# Patient Record
Sex: Male | Born: 1965
Health system: Southern US, Community
[De-identification: ages and names within clinical notes are randomized; demographics above are authoritative.]

## PROBLEM LIST (undated history)

## (undated) DIAGNOSIS — R0600 Dyspnea, unspecified: Secondary | ICD-10-CM

## (undated) DIAGNOSIS — F32A Depression, unspecified: Secondary | ICD-10-CM

## (undated) DIAGNOSIS — J449 Chronic obstructive pulmonary disease, unspecified: Secondary | ICD-10-CM

## (undated) DIAGNOSIS — I10 Essential (primary) hypertension: Secondary | ICD-10-CM

## (undated) DIAGNOSIS — IMO0002 Reserved for concepts with insufficient information to code with codable children: Secondary | ICD-10-CM

## (undated) DIAGNOSIS — R52 Pain, unspecified: Secondary | ICD-10-CM

## (undated) DIAGNOSIS — F329 Major depressive disorder, single episode, unspecified: Secondary | ICD-10-CM

## (undated) DIAGNOSIS — G4730 Sleep apnea, unspecified: Secondary | ICD-10-CM

## (undated) DIAGNOSIS — M75102 Unspecified rotator cuff tear or rupture of left shoulder, not specified as traumatic: Secondary | ICD-10-CM

## (undated) DIAGNOSIS — F319 Bipolar disorder, unspecified: Secondary | ICD-10-CM

## (undated) DIAGNOSIS — K219 Gastro-esophageal reflux disease without esophagitis: Secondary | ICD-10-CM

## (undated) DIAGNOSIS — G709 Myoneural disorder, unspecified: Secondary | ICD-10-CM

## (undated) HISTORY — DX: Gastro-esophageal reflux disease without esophagitis: K21.9

## (undated) HISTORY — PX: COLON SURGERY: SHX602

## (undated) HISTORY — PX: FEMUR IM NAIL: SHX1597

## (undated) HISTORY — PX: SPINAL CORD STIMULATOR IMPLANT: SHX2422

## (undated) HISTORY — PX: ABDOMINAL EXPLORATION SURGERY: SHX538

## (undated) HISTORY — PX: ANKLE SURGERY: SHX546

## (undated) HISTORY — PX: BACK SURGERY: SHX140

## (undated) HISTORY — PX: PARTIAL COLECTOMY: SHX5273

## (undated) HISTORY — PX: CATARACT EXTRACTION: SUR2

## (undated) HISTORY — PX: LEG SURGERY: SHX1003

---

## 2000-12-19 ENCOUNTER — Encounter: Payer: Self-pay | Admitting: Family Medicine

## 2000-12-19 ENCOUNTER — Ambulatory Visit (HOSPITAL_COMMUNITY): Admission: RE | Admit: 2000-12-19 | Discharge: 2000-12-19 | Payer: Self-pay | Admitting: Family Medicine

## 2001-01-15 ENCOUNTER — Emergency Department (HOSPITAL_COMMUNITY): Admission: EM | Admit: 2001-01-15 | Discharge: 2001-01-15 | Payer: Self-pay | Admitting: *Deleted

## 2002-01-31 ENCOUNTER — Encounter: Payer: Self-pay | Admitting: Neurosurgery

## 2002-01-31 ENCOUNTER — Encounter: Payer: Self-pay | Admitting: *Deleted

## 2002-01-31 ENCOUNTER — Encounter: Payer: Self-pay | Admitting: General Surgery

## 2002-01-31 ENCOUNTER — Encounter: Payer: Self-pay | Admitting: Emergency Medicine

## 2002-01-31 ENCOUNTER — Inpatient Hospital Stay (HOSPITAL_COMMUNITY): Admission: EM | Admit: 2002-01-31 | Discharge: 2002-02-07 | Payer: Self-pay | Admitting: Emergency Medicine

## 2002-02-06 ENCOUNTER — Encounter: Payer: Self-pay | Admitting: General Surgery

## 2002-02-07 ENCOUNTER — Encounter: Payer: Self-pay | Admitting: Neurosurgery

## 2002-05-31 ENCOUNTER — Ambulatory Visit (HOSPITAL_BASED_OUTPATIENT_CLINIC_OR_DEPARTMENT_OTHER): Admission: RE | Admit: 2002-05-31 | Discharge: 2002-05-31 | Payer: Self-pay | Admitting: Orthopedic Surgery

## 2002-06-05 ENCOUNTER — Encounter: Admission: RE | Admit: 2002-06-05 | Discharge: 2002-07-05 | Payer: Self-pay | Admitting: Orthopedic Surgery

## 2002-08-02 ENCOUNTER — Emergency Department (HOSPITAL_COMMUNITY): Admission: EM | Admit: 2002-08-02 | Discharge: 2002-08-02 | Payer: Self-pay | Admitting: *Deleted

## 2002-10-29 ENCOUNTER — Encounter: Payer: Self-pay | Admitting: Internal Medicine

## 2002-10-29 ENCOUNTER — Emergency Department (HOSPITAL_COMMUNITY): Admission: EM | Admit: 2002-10-29 | Discharge: 2002-10-29 | Payer: Self-pay | Admitting: Internal Medicine

## 2003-02-27 ENCOUNTER — Encounter: Payer: Self-pay | Admitting: Emergency Medicine

## 2003-02-27 ENCOUNTER — Inpatient Hospital Stay (HOSPITAL_COMMUNITY): Admission: AC | Admit: 2003-02-27 | Discharge: 2003-03-05 | Payer: Self-pay

## 2003-02-27 ENCOUNTER — Encounter (INDEPENDENT_AMBULATORY_CARE_PROVIDER_SITE_OTHER): Payer: Self-pay

## 2003-05-07 ENCOUNTER — Encounter: Payer: Self-pay | Admitting: Pediatrics

## 2003-05-07 ENCOUNTER — Ambulatory Visit (HOSPITAL_COMMUNITY): Admission: RE | Admit: 2003-05-07 | Discharge: 2003-05-07 | Payer: Self-pay | Admitting: Pediatrics

## 2003-07-09 ENCOUNTER — Ambulatory Visit (HOSPITAL_COMMUNITY): Admission: RE | Admit: 2003-07-09 | Discharge: 2003-07-09 | Payer: Self-pay | Admitting: Family Medicine

## 2003-07-09 ENCOUNTER — Encounter (HOSPITAL_COMMUNITY): Admission: RE | Admit: 2003-07-09 | Discharge: 2003-08-02 | Payer: Self-pay | Admitting: Family Medicine

## 2003-08-06 ENCOUNTER — Encounter (HOSPITAL_COMMUNITY): Admission: RE | Admit: 2003-08-06 | Discharge: 2003-09-05 | Payer: Self-pay | Admitting: Family Medicine

## 2003-11-18 ENCOUNTER — Inpatient Hospital Stay (HOSPITAL_COMMUNITY): Admission: RE | Admit: 2003-11-18 | Discharge: 2003-11-20 | Payer: Self-pay | Admitting: Orthopaedic Surgery

## 2004-05-31 ENCOUNTER — Emergency Department (HOSPITAL_COMMUNITY): Admission: EM | Admit: 2004-05-31 | Discharge: 2004-05-31 | Payer: Self-pay | Admitting: Emergency Medicine

## 2004-08-01 ENCOUNTER — Emergency Department (HOSPITAL_COMMUNITY): Admission: EM | Admit: 2004-08-01 | Discharge: 2004-08-01 | Payer: Self-pay | Admitting: Emergency Medicine

## 2004-12-28 ENCOUNTER — Emergency Department (HOSPITAL_COMMUNITY): Admission: EM | Admit: 2004-12-28 | Discharge: 2004-12-28 | Payer: Self-pay | Admitting: Emergency Medicine

## 2005-02-19 ENCOUNTER — Ambulatory Visit: Payer: Self-pay | Admitting: Psychiatry

## 2005-02-19 ENCOUNTER — Inpatient Hospital Stay (HOSPITAL_COMMUNITY): Admission: EM | Admit: 2005-02-19 | Discharge: 2005-02-22 | Payer: Self-pay | Admitting: Psychiatry

## 2005-03-18 ENCOUNTER — Emergency Department (HOSPITAL_COMMUNITY): Admission: EM | Admit: 2005-03-18 | Discharge: 2005-03-18 | Payer: Self-pay | Admitting: Emergency Medicine

## 2005-06-24 ENCOUNTER — Observation Stay (HOSPITAL_COMMUNITY): Admission: EM | Admit: 2005-06-24 | Discharge: 2005-06-25 | Payer: Self-pay | Admitting: Emergency Medicine

## 2005-07-19 ENCOUNTER — Ambulatory Visit: Payer: Self-pay | Admitting: Orthopedic Surgery

## 2005-08-19 ENCOUNTER — Emergency Department (HOSPITAL_COMMUNITY): Admission: EM | Admit: 2005-08-19 | Discharge: 2005-08-19 | Payer: Self-pay | Admitting: Emergency Medicine

## 2005-08-21 ENCOUNTER — Emergency Department (HOSPITAL_COMMUNITY): Admission: EM | Admit: 2005-08-21 | Discharge: 2005-08-21 | Payer: Self-pay | Admitting: Emergency Medicine

## 2006-06-12 ENCOUNTER — Emergency Department (HOSPITAL_COMMUNITY): Admission: EM | Admit: 2006-06-12 | Discharge: 2006-06-12 | Payer: Self-pay | Admitting: Emergency Medicine

## 2007-01-23 IMAGING — CR DG HIP COMPLETE 2+V*R*
4 series · 4 of 4 positions shown · non-contrast
Comparison: none

CLINICAL DATA: Removal of rod in neck and head of right femur.  Now with redness and pain in incision site.
 RIGHT HIP - 2 VIEW:

[view not recorded (1 of 4)]
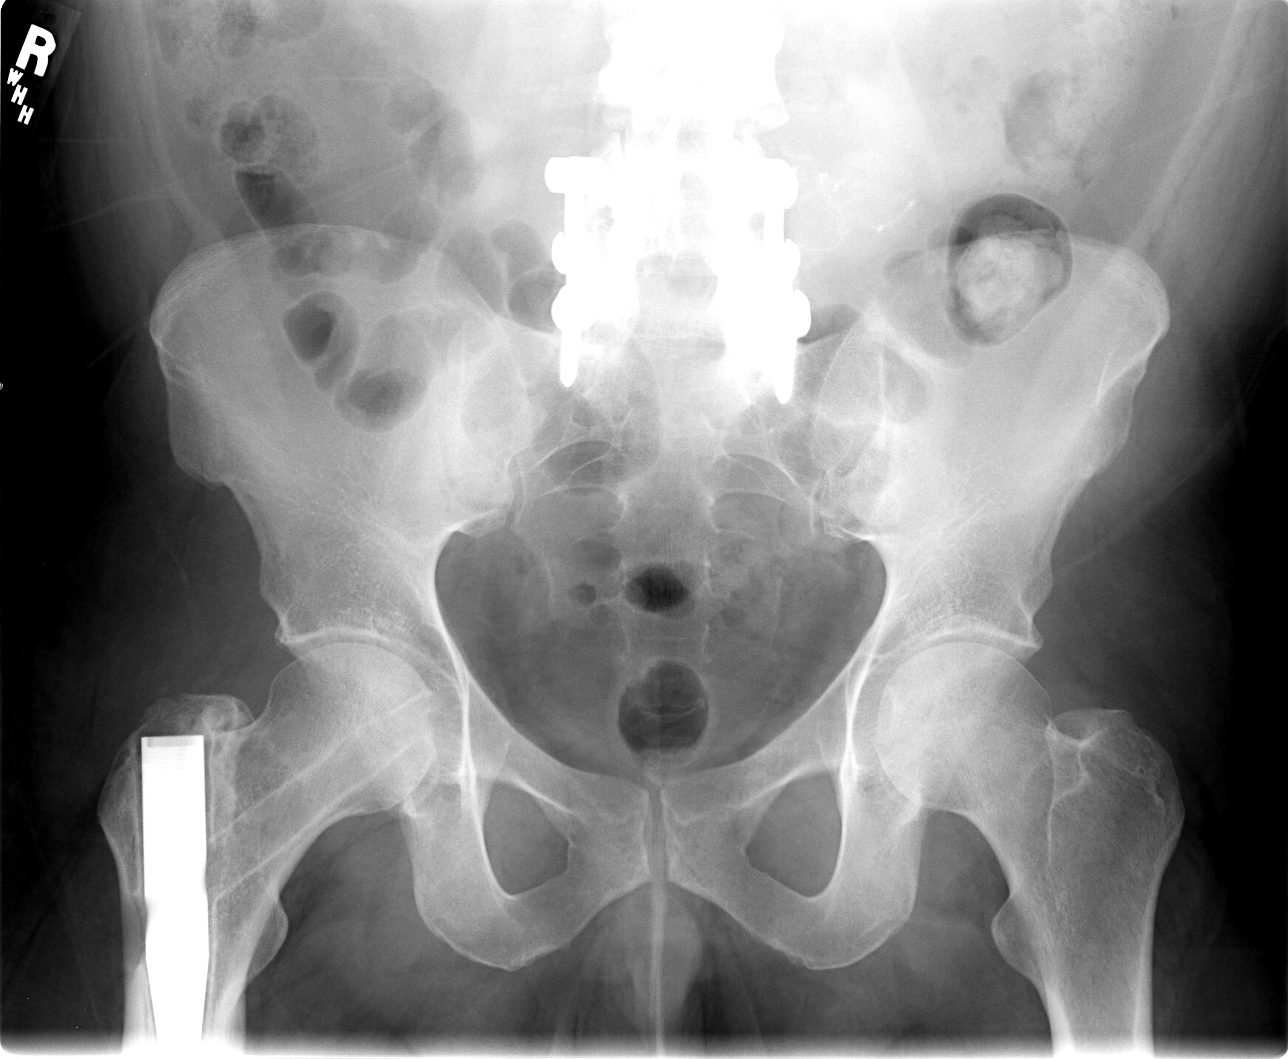

[view not recorded (2 of 4)]
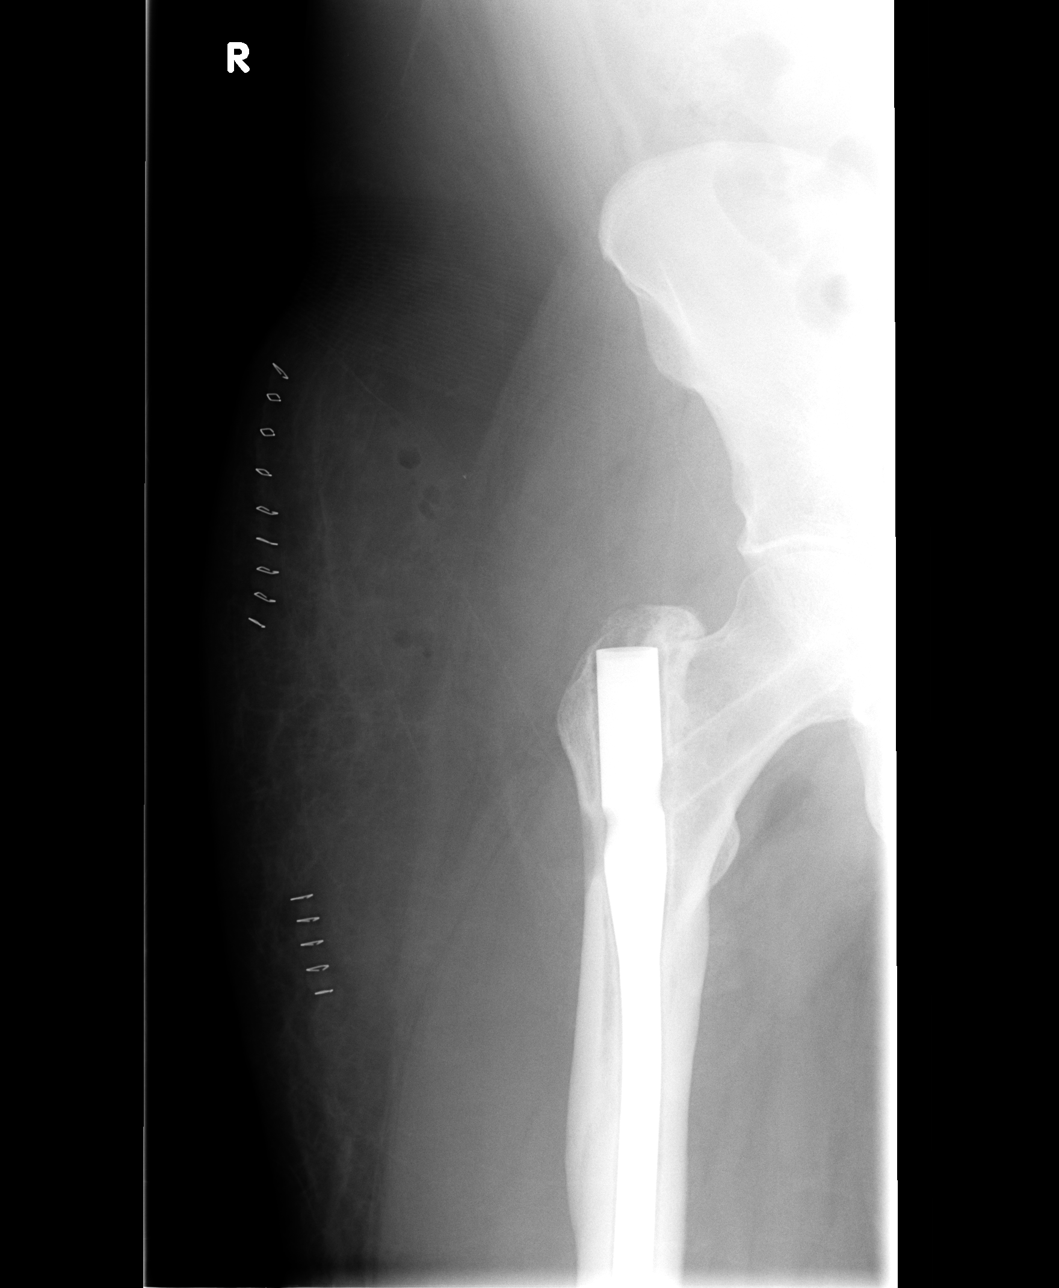

[view not recorded (3 of 4)]
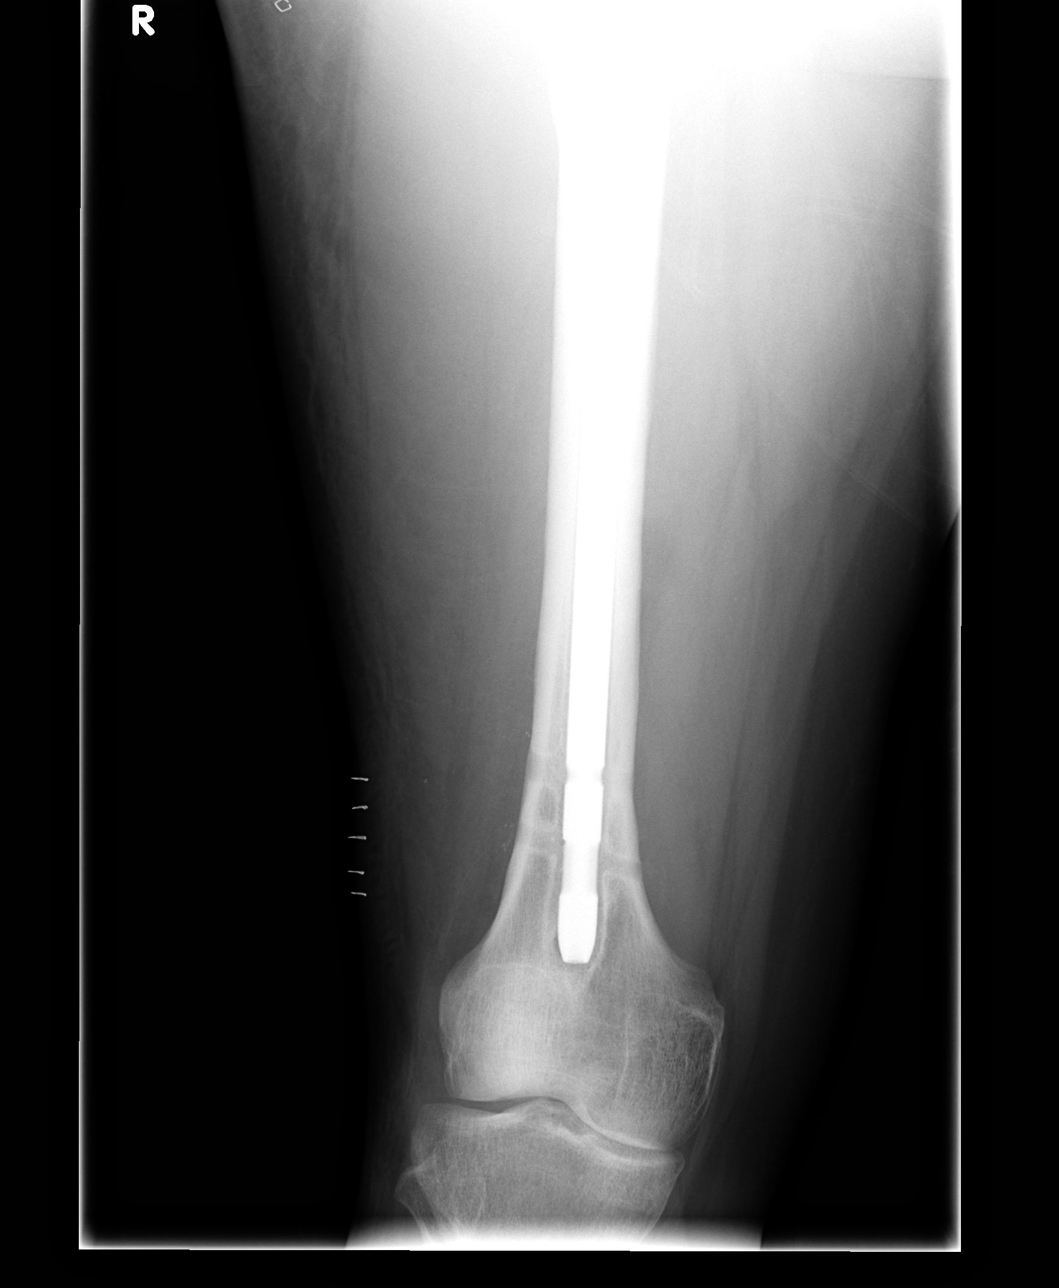

[view not recorded (4 of 4)]
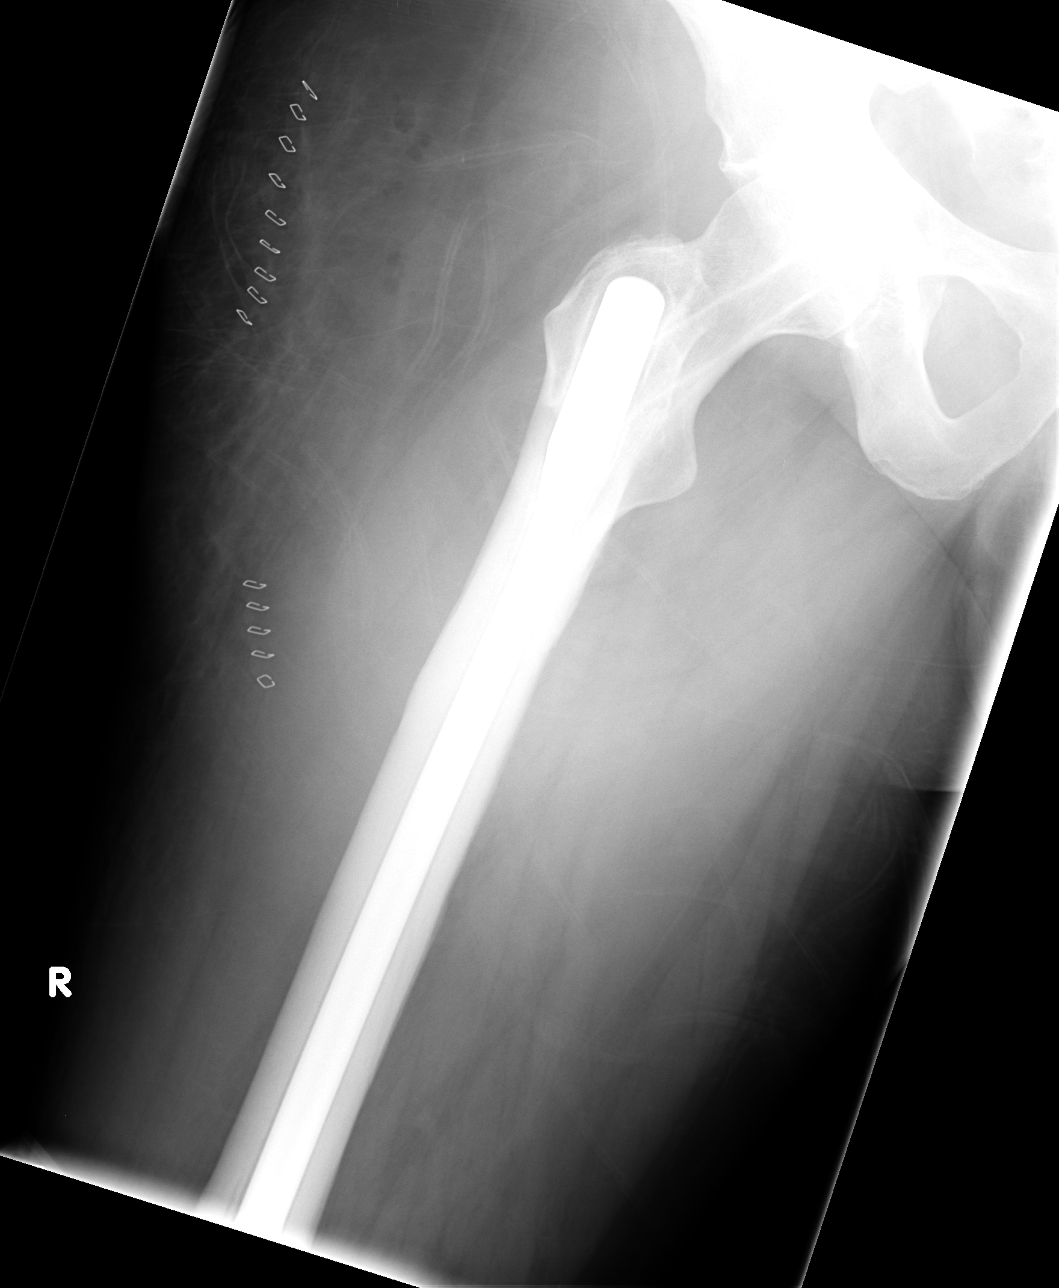

[4 of 4 positions shown; findings below may reference images not displayed]

FINDINGS: There is intramedullary rod.  The femoral neck screw has been removed.  The hips are both normally located.  No fractures or dislocations are identified.  
 There are staples overlying the distal thigh as well as the lateral aspect of the upper leg.  Foci of subcutaneous emphysema is nonspecific in the early postoperative setting.
IMPRESSION: 1.  Postoperative changes consistent with the provided clinical history.
 2.  No acute osseous abnormalities noted.

## 2007-05-04 ENCOUNTER — Ambulatory Visit: Payer: Self-pay | Admitting: Pain Medicine

## 2008-07-01 ENCOUNTER — Ambulatory Visit: Payer: Self-pay | Admitting: Family Medicine

## 2008-07-01 DIAGNOSIS — M129 Arthropathy, unspecified: Secondary | ICD-10-CM | POA: Insufficient documentation

## 2008-07-01 DIAGNOSIS — F319 Bipolar disorder, unspecified: Secondary | ICD-10-CM | POA: Insufficient documentation

## 2008-07-01 DIAGNOSIS — E785 Hyperlipidemia, unspecified: Secondary | ICD-10-CM | POA: Insufficient documentation

## 2008-07-01 DIAGNOSIS — K219 Gastro-esophageal reflux disease without esophagitis: Secondary | ICD-10-CM

## 2008-07-01 DIAGNOSIS — IMO0002 Reserved for concepts with insufficient information to code with codable children: Secondary | ICD-10-CM | POA: Insufficient documentation

## 2008-07-01 DIAGNOSIS — F329 Major depressive disorder, single episode, unspecified: Secondary | ICD-10-CM

## 2008-07-01 DIAGNOSIS — F411 Generalized anxiety disorder: Secondary | ICD-10-CM | POA: Insufficient documentation

## 2008-07-01 DIAGNOSIS — M545 Low back pain, unspecified: Secondary | ICD-10-CM | POA: Insufficient documentation

## 2008-07-01 DIAGNOSIS — G43909 Migraine, unspecified, not intractable, without status migrainosus: Secondary | ICD-10-CM | POA: Insufficient documentation

## 2008-07-03 ENCOUNTER — Ambulatory Visit (HOSPITAL_COMMUNITY): Admission: RE | Admit: 2008-07-03 | Discharge: 2008-07-03 | Payer: Self-pay | Admitting: Family Medicine

## 2008-07-09 ENCOUNTER — Encounter (INDEPENDENT_AMBULATORY_CARE_PROVIDER_SITE_OTHER): Payer: Self-pay | Admitting: Family Medicine

## 2008-07-12 ENCOUNTER — Encounter (INDEPENDENT_AMBULATORY_CARE_PROVIDER_SITE_OTHER): Payer: Self-pay | Admitting: Family Medicine

## 2008-08-05 ENCOUNTER — Ambulatory Visit: Payer: Self-pay | Admitting: Family Medicine

## 2008-08-07 ENCOUNTER — Ambulatory Visit (HOSPITAL_COMMUNITY): Admission: RE | Admit: 2008-08-07 | Discharge: 2008-08-07 | Payer: Self-pay | Admitting: Family Medicine

## 2008-09-27 ENCOUNTER — Ambulatory Visit: Payer: Self-pay | Admitting: Family Medicine

## 2008-09-27 DIAGNOSIS — R5383 Other fatigue: Secondary | ICD-10-CM

## 2008-09-27 DIAGNOSIS — G039 Meningitis, unspecified: Secondary | ICD-10-CM | POA: Insufficient documentation

## 2008-09-27 DIAGNOSIS — R5381 Other malaise: Secondary | ICD-10-CM

## 2008-10-14 ENCOUNTER — Encounter (INDEPENDENT_AMBULATORY_CARE_PROVIDER_SITE_OTHER): Payer: Self-pay | Admitting: Family Medicine

## 2008-12-16 ENCOUNTER — Ambulatory Visit: Payer: Self-pay | Admitting: Family Medicine

## 2009-01-03 ENCOUNTER — Encounter (INDEPENDENT_AMBULATORY_CARE_PROVIDER_SITE_OTHER): Payer: Self-pay | Admitting: *Deleted

## 2009-01-24 ENCOUNTER — Ambulatory Visit: Payer: Self-pay | Admitting: Family Medicine

## 2009-01-24 DIAGNOSIS — E663 Overweight: Secondary | ICD-10-CM | POA: Insufficient documentation

## 2009-02-17 ENCOUNTER — Emergency Department (HOSPITAL_COMMUNITY): Admission: EM | Admit: 2009-02-17 | Discharge: 2009-02-17 | Payer: Self-pay | Admitting: Emergency Medicine

## 2009-02-21 ENCOUNTER — Ambulatory Visit: Payer: Self-pay | Admitting: Family Medicine

## 2009-03-18 ENCOUNTER — Encounter (INDEPENDENT_AMBULATORY_CARE_PROVIDER_SITE_OTHER): Payer: Self-pay | Admitting: Family Medicine

## 2009-03-25 ENCOUNTER — Encounter (INDEPENDENT_AMBULATORY_CARE_PROVIDER_SITE_OTHER): Payer: Self-pay | Admitting: Family Medicine

## 2009-04-10 ENCOUNTER — Ambulatory Visit: Payer: Self-pay | Admitting: Family Medicine

## 2009-04-11 ENCOUNTER — Encounter (INDEPENDENT_AMBULATORY_CARE_PROVIDER_SITE_OTHER): Payer: Self-pay | Admitting: Family Medicine

## 2009-12-22 ENCOUNTER — Emergency Department (HOSPITAL_COMMUNITY): Admission: EM | Admit: 2009-12-22 | Discharge: 2009-12-22 | Payer: Self-pay | Admitting: Emergency Medicine

## 2010-05-09 ENCOUNTER — Emergency Department (HOSPITAL_COMMUNITY): Admission: EM | Admit: 2010-05-09 | Discharge: 2010-05-09 | Payer: Self-pay | Admitting: Diagnostic Radiology

## 2010-05-10 ENCOUNTER — Emergency Department (HOSPITAL_COMMUNITY): Admission: EM | Admit: 2010-05-10 | Discharge: 2010-05-10 | Payer: Self-pay | Admitting: Emergency Medicine

## 2010-05-27 ENCOUNTER — Ambulatory Visit (HOSPITAL_COMMUNITY): Admission: RE | Admit: 2010-05-27 | Discharge: 2010-05-27 | Payer: Self-pay | Admitting: Orthopaedic Surgery

## 2010-11-30 ENCOUNTER — Emergency Department (HOSPITAL_COMMUNITY)
Admission: EM | Admit: 2010-11-30 | Discharge: 2010-11-30 | Disposition: A | Discharge status: Home / Self Care | Payer: Medicare PPO | Attending: Emergency Medicine | Admitting: Emergency Medicine

## 2010-11-30 DIAGNOSIS — F411 Generalized anxiety disorder: Secondary | ICD-10-CM | POA: Insufficient documentation

## 2010-11-30 DIAGNOSIS — Z79899 Other long term (current) drug therapy: Secondary | ICD-10-CM | POA: Insufficient documentation

## 2010-11-30 DIAGNOSIS — M545 Low back pain, unspecified: Secondary | ICD-10-CM | POA: Insufficient documentation

## 2010-11-30 DIAGNOSIS — F3289 Other specified depressive episodes: Secondary | ICD-10-CM | POA: Insufficient documentation

## 2010-11-30 DIAGNOSIS — F329 Major depressive disorder, single episode, unspecified: Secondary | ICD-10-CM | POA: Insufficient documentation
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
## 2010-12-18 NOTE — Op Note (Signed)
[place]  Patient:    [name] [name], [name] [name] Visit Number: [id number] MRN: [id number]          Service Type: TRA Location: MICU 2112 01 Attending Physician:  Trauma, Md Dictated by:   Payton Doughty, M.D. Proc. Date: 01/31/02 Admit Date:  01/31/2002                             Operative Report  PREOPERATIVE DIAGNOSIS:  L5 burst fracture.  POSTOPERATIVE DIAGNOSIS:  L5 burst fracture.  PROCEDURE:  L4-5, L5-S1 pedicle fixation and fusion with posterolateral arthrodesis, laminectomy for decompression of L5.  SURGEON:  Payton Doughty, M.D.  ANESTHESIA:  General endotracheal.  ASSISTANT:  [place].  PREPARATION:  Sterile Betadine prep and scrub with alcohol wipe.  COMPLICATIONS:  None.  DESCRIPTION OF PROCEDURE:  A 45 year old gentleman with an L5 burst fracture with canal compromise.  He was taken to the operating room and smoothly anesthetized and intubated, placed prone on the operating room table. Following shave, prep, and drape in the usual sterile fashion, the skin was infiltrated with 1% lidocaine and 1:400,000 epinephrine.  The skin was incised from the bottom of S1 to the mid-L3, and the laminae of L3, L4, L5, and S1 were exposed bilaterally in the subperiosteal plane out over the transverse processes of L4, L5, and S1 and the sacral alae bilaterally.  Intraoperative x-ray confirmed correctness of the level.  Laminectomy of L5 was carried out on the right side and the lateral margin of the dural sac carefully explored and the anterior epidural space explored.  A retropulsed fragment of bone was pushed back into the vertebral body.  Similar exploration was carried out on the right side.  The lamina had been fractured off and the transverse process essentially split.  This was removed in toto.  The anterior epidural space was once again explored from the left side and found to be free.  The neural foramina of L4, L5, and S1 were also  carefully explored and found to be opened.  The pedicle screws were then placed at L4, L5, and S1, 30 mm at S1 and L5, 35 mm at L4.  The L4 screw on the left side required reorientation one time for lateral cut-out.  Following placement of the screws, intraoperative x-ray showed good placement.  Rods were placed across and were connected with locking caps.  The remaining laminar bone and the facet joints were decorticated with the high-speed drill and packed with BMP and morcellized allograft.  Following this, the fascia was reapproximated with 0 Vicryl in interrupted fashion, the subcutaneous tissue was reapproximated with 0 Vicryl in interrupted fashion, and the patients subcuticular was reapproximated with 0 Vicryl in interrupted fashion and the skin was closed with 3-0 nylon in running locked fashion.  Bacitracin and Telfa dressing was applied and made occlusive with OpSite.  The patient returned to the recovery room in good condition. Dictated by:   Payton Doughty, M.D. Attending Physician:  Trauma, Md DD:  01/31/02 TD:  02/03/02 Job: [id number]
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
## 2010-12-18 NOTE — Procedures (Signed)
[facility]  Patient:    [name] [name], [name] [name] Visit Number: [id number] MRN: [id number]          Service Type: TRA Location: 3000 3006 01 Attending Physician:  Trauma, Md Dictated by:   Kari Baars, M.D. Proc. Date: 01/31/02 Admit Date:  01/31/2002                            EKG Interpretations  DATE:  January 31, 2002, at 0813 hours.  PHYSICIAN:  Dr. Kari Baars.  PROCEDURE:  Electrocardiogram.  DESCRIPTION OF PROCEDURE:  The rhythm is a sinus rhythm.  There is at least one pause seen which appears to be perhaps from sinus arrhythmia.  Otherwise normal electrocardiogram. Dictated by:   Kari Baars, M.D. Attending Physician:  Trauma, Md DD:  01/31/02 TD:  02/04/02 Job: [id number]

## 2010-12-18 NOTE — Op Note (Signed)
NAME:  [name] [name], [name] [name] NO.:  [id number]   MEDICAL RECORD NO.:  [id number]                   PATIENT TYPE:  OIB   LOCATION:  3012                                 FACILITY:  [facility]   PHYSICIAN:  Mark C. Ophelia Charter, M.D.                 DATE OF BIRTH:  Apr 30, 1966   DATE OF PROCEDURE:  11/18/2003  DATE OF DISCHARGE:                                 OPERATIVE REPORT   PREOPERATIVE DIAGNOSIS:  Painful hardware, right ankle.   POSTOPERATIVE DIAGNOSIS:  Painful hardware, right ankle.   OPERATION PERFORMED:  Painful hardware removal, right tibia.  Interfragmentary lag screw times two.   SURGEON:  Mark C. Ophelia Charter, M.D.   ASSISTANT:  Sandrea Matte, P.A.   ANESTHESIA:  General orotracheal anesthesia.   TOURNIQUET TIME:  Less than 30 minutes.   INDICATIONS FOR PROCEDURE:  After induction of general anesthesia,  orotracheal intubation, the leg was cleaned with a clipper.  Preoperative  Ancef was given prophylactically.  This patient had open reduction internal  fixation of a tib-fib fracture with long medial plate which was very  prominent, stuck out 1 cm to 1.5 cm off of the medial malleolus and was  painful.  He also had a screw that backed out anterolateral in the tibia  which was anterior to the fibula which was a 4.5 cortical screw from his  previous surgery that [place].   DESCRIPTION OF PROCEDURE:  Medial incision was opened initially.  Plate was  immediately identified in the proximal aspect of the incision and a scalpel  and cautery were used to clean the soft tissue off the side of the plate.  Subperiosteal dissection with an elevator was used to peel the soft tissue  off the plate and immediately screws were removed using combination of hand  screwdriver and also power screwdriver, once the seal was broken and they  started to back up.  There were two screws that were stripped.  Once was  able to be removed by angling the  screw driver, impacting it with a hammer  and then slipping initial Glorious Peach just underneath the head of the screw  followed by a tonsil clamp and extracting the screw.  The remaining screw  would not back out in any event and the hex head portion of it was stripped.  This was a single screw hole in the plate. Curved osteotomes were used to  run all the way around the plate and make sure there was no overgrown bone  holding it in and then half inch osteotome was used to try to divide the  screw underneath the head in order to removed the painful long plate that  was protruding.  As the screw was trying to be divided a butterfly portion  of the cortex fractured loose as the screw came loose.  Screw and plate will  be removed.  The tibia was  inspected under C-arm and there was a butterfly  fragment which was present but the lateral cortex, anterior cortex, most of  the posterior cortex was intact.  Butterfly piece was reduced, held with  lobster claw clamp and two 3.5 cortical screws were placed from  anterolateral after elevating periosteum back to the posterior cortex which  tightened down the fragment securely.  After irrigation with saline  solution, 2-0 Vicryl was used to reapproximate the subcutaneous tissue, skin  staple closure.  Distal anterolateral screw was removed by making a stab  incision using a Freer to remove the soft tissue and then using the large  fragment screw driver to remove the screw.  This was closed with staples as  well.  The patient was then placed in a short leg splint after  Xeroform, 4 x 4s, Webril, short leg splint and Ace wrap.  The patient  tolerated the procedure well and transferred to recovery room.  He will be  protected weightbearing in a boot due to the small butterfly fragment to  help with stability times six weeks.                                               Mark C. Ophelia Charter, M.D.    MCY/MEDQ  D:  11/18/2003  T:  11/19/2003  Job:  [id number]
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
## 2010-12-18 NOTE — Procedures (Signed)
   NAME:  MOHID, FURUYA                         ACCOUNT NO.:  [id number]   MEDICAL RECORD NO.:  [id number]                  PATIENT TYPE:   LOCATION:                                       FACILITY:   PHYSICIAN:  Edward L. Juanetta Gosling, M.D.             DATE OF BIRTH:   DATE OF PROCEDURE:  10/29/2002  DATE OF DISCHARGE:                                EKG INTERPRETATION   DATE/TIME OF SERVICE:  0302 on 10/29/02   RESULTS:  The rhythm was sinus rhythm with a rate in the 90s, otherwise  normal electrocardiogram.                                               Edward L. Juanetta Gosling, M.D.    ELH/MEDQ  D:  11/05/2002  T:  11/06/2002  Job:  [id number]

## 2011-01-29 ENCOUNTER — Emergency Department (HOSPITAL_COMMUNITY): Payer: Medicare PPO

## 2011-01-29 ENCOUNTER — Emergency Department (HOSPITAL_COMMUNITY)
Admission: EM | Admit: 2011-01-29 | Discharge: 2011-01-29 | Discharge status: Against Medical Advice | Payer: Medicare PPO | Attending: Emergency Medicine | Admitting: Emergency Medicine

## 2011-01-29 ENCOUNTER — Emergency Department (HOSPITAL_COMMUNITY)
Admission: EM | Admit: 2011-01-29 | Discharge: 2011-01-29 | Disposition: A | Discharge status: Home / Self Care | Payer: Medicare PPO | Attending: Emergency Medicine | Admitting: Emergency Medicine

## 2011-01-29 DIAGNOSIS — F17200 Nicotine dependence, unspecified, uncomplicated: Secondary | ICD-10-CM | POA: Insufficient documentation

## 2011-01-29 DIAGNOSIS — G8929 Other chronic pain: Secondary | ICD-10-CM | POA: Insufficient documentation

## 2011-01-29 DIAGNOSIS — Z0389 Encounter for observation for other suspected diseases and conditions ruled out: Secondary | ICD-10-CM | POA: Insufficient documentation

## 2011-01-29 DIAGNOSIS — F3289 Other specified depressive episodes: Secondary | ICD-10-CM | POA: Insufficient documentation

## 2011-01-29 DIAGNOSIS — R079 Chest pain, unspecified: Secondary | ICD-10-CM | POA: Insufficient documentation

## 2011-01-29 DIAGNOSIS — F329 Major depressive disorder, single episode, unspecified: Secondary | ICD-10-CM | POA: Insufficient documentation

## 2011-01-29 DIAGNOSIS — M545 Low back pain, unspecified: Secondary | ICD-10-CM | POA: Insufficient documentation

## 2011-01-29 DIAGNOSIS — F1911 Other psychoactive substance abuse, in remission: Secondary | ICD-10-CM | POA: Insufficient documentation

## 2011-01-29 DIAGNOSIS — F411 Generalized anxiety disorder: Secondary | ICD-10-CM | POA: Insufficient documentation

## 2011-02-10 ENCOUNTER — Encounter: Payer: Self-pay | Admitting: *Deleted

## 2011-02-10 ENCOUNTER — Emergency Department (HOSPITAL_COMMUNITY)
Admission: EM | Admit: 2011-02-10 | Discharge: 2011-02-11 | Disposition: A | Discharge status: Home / Self Care | Payer: Medicare PPO | Attending: Emergency Medicine | Admitting: Emergency Medicine

## 2011-02-10 DIAGNOSIS — F319 Bipolar disorder, unspecified: Secondary | ICD-10-CM | POA: Insufficient documentation

## 2011-02-10 DIAGNOSIS — Z87891 Personal history of nicotine dependence: Secondary | ICD-10-CM | POA: Insufficient documentation

## 2011-02-10 DIAGNOSIS — H9319 Tinnitus, unspecified ear: Secondary | ICD-10-CM | POA: Insufficient documentation

## 2011-02-10 DIAGNOSIS — H9209 Otalgia, unspecified ear: Secondary | ICD-10-CM | POA: Insufficient documentation

## 2011-02-10 DIAGNOSIS — H9311 Tinnitus, right ear: Secondary | ICD-10-CM

## 2011-02-10 HISTORY — DX: Bipolar disorder, unspecified: F31.9

## 2011-02-10 HISTORY — DX: Reserved for concepts with insufficient information to code with codable children: IMO0002

## 2011-02-10 HISTORY — DX: Depression, unspecified: F32.A

## 2011-02-10 HISTORY — DX: Major depressive disorder, single episode, unspecified: F32.9

## 2011-02-10 NOTE — ED Notes (Signed)
Patient c/o ear ringing in right ear and ear ache to left ear for few months per pt

## 2011-02-10 NOTE — ED Notes (Signed)
Patient is resting comfortably. Family at bedside.  

## 2011-02-11 MED ORDER — MECLIZINE HCL 25 MG PO TABS: 25.0000 mg | ORAL_TABLET | Freq: Four times a day (QID) | ORAL | Status: AC

## 2011-02-11 MED ORDER — ANTIPYRINE-BENZOCAINE 5.4-1.4 % OT SOLN: 3.0000 [drp] | OTIC | Status: AC | PRN
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
## 2011-02-25 ENCOUNTER — Encounter (HOSPITAL_COMMUNITY): Payer: Self-pay

## 2011-02-25 ENCOUNTER — Emergency Department (HOSPITAL_COMMUNITY)
Admission: EM | Admit: 2011-02-25 | Discharge: 2011-02-25 | Disposition: A | Discharge status: Home / Self Care | Payer: Medicare PPO | Attending: Emergency Medicine | Admitting: Emergency Medicine

## 2011-02-25 DIAGNOSIS — F17200 Nicotine dependence, unspecified, uncomplicated: Secondary | ICD-10-CM | POA: Insufficient documentation

## 2011-02-25 DIAGNOSIS — F319 Bipolar disorder, unspecified: Secondary | ICD-10-CM | POA: Insufficient documentation

## 2011-02-25 DIAGNOSIS — H6690 Otitis media, unspecified, unspecified ear: Secondary | ICD-10-CM | POA: Insufficient documentation

## 2011-02-25 MED ORDER — ALPRAZOLAM 0.5 MG PO TABS
0.5000 mg | ORAL_TABLET | Freq: Once | ORAL | Status: AC
  Administered 2011-02-25: 0.5 mg via ORAL
  Filled 2011-02-25: qty 1

## 2011-02-25 MED ORDER — AMOXICILLIN 500 MG PO CAPS: 500.0000 mg | ORAL_CAPSULE | Freq: Three times a day (TID) | ORAL | Status: AC

## 2011-02-25 MED ORDER — CLONAZEPAM 0.5 MG PO TABS: 0.5000 mg | ORAL_TABLET | Freq: Two times a day (BID) | ORAL | Status: DC | PRN

## 2011-02-25 MED ORDER — AMOXICILLIN 500 MG PO CAPS: 500.0000 mg | ORAL_CAPSULE | Freq: Three times a day (TID) | ORAL | Status: DC

## 2011-02-25 NOTE — ED Notes (Signed)
Pt reports bilateral ear pain for the past few months.  Pt reports a constant dull ache to both ears.
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
## 2011-07-25 ENCOUNTER — Encounter (HOSPITAL_COMMUNITY): Payer: Self-pay | Admitting: *Deleted

## 2011-07-25 ENCOUNTER — Emergency Department (HOSPITAL_COMMUNITY)
Admission: EM | Admit: 2011-07-25 | Discharge: 2011-07-25 | Disposition: A | Discharge status: Home / Self Care | Payer: Medicare PPO | Attending: Emergency Medicine | Admitting: Emergency Medicine

## 2011-07-25 DIAGNOSIS — Z87828 Personal history of other (healed) physical injury and trauma: Secondary | ICD-10-CM | POA: Insufficient documentation

## 2011-07-25 DIAGNOSIS — F319 Bipolar disorder, unspecified: Secondary | ICD-10-CM | POA: Insufficient documentation

## 2011-07-25 DIAGNOSIS — R443 Hallucinations, unspecified: Secondary | ICD-10-CM | POA: Insufficient documentation

## 2011-07-25 DIAGNOSIS — F17200 Nicotine dependence, unspecified, uncomplicated: Secondary | ICD-10-CM | POA: Insufficient documentation

## 2011-07-25 LAB — DIFFERENTIAL
Basophils Absolute: 0.1 10*3/uL (ref 0.0–0.1)
Basophils Relative: 0 % (ref 0–1)
Neutro Abs: 13.2 10*3/uL — ABNORMAL HIGH (ref 1.7–7.7)
Neutrophils Relative %: 74 % (ref 43–77)

## 2011-07-25 LAB — COMPREHENSIVE METABOLIC PANEL
AST: 12 U/L (ref 0–37)
Albumin: 4 g/dL (ref 3.5–5.2)
Alkaline Phosphatase: 75 U/L (ref 39–117)
Chloride: 98 mEq/L (ref 96–112)
Potassium: 3 mEq/L — ABNORMAL LOW (ref 3.5–5.1)
Sodium: 133 mEq/L — ABNORMAL LOW (ref 135–145)
Total Bilirubin: 0.2 mg/dL — ABNORMAL LOW (ref 0.3–1.2)
Total Protein: 7 g/dL (ref 6.0–8.3)

## 2011-07-25 LAB — RAPID URINE DRUG SCREEN, HOSP PERFORMED
Amphetamines: NOT DETECTED
Barbiturates: NOT DETECTED
Cocaine: NOT DETECTED
Tetrahydrocannabinol: NOT DETECTED

## 2011-07-25 LAB — CBC
Hemoglobin: 17 g/dL (ref 13.0–17.0)
MCHC: 35.3 g/dL (ref 30.0–36.0)
Platelets: 267 10*3/uL (ref 150–400)
RDW: 12.2 % (ref 11.5–15.5)

## 2011-07-25 MED ORDER — POTASSIUM CHLORIDE CRYS ER 20 MEQ PO TBCR
40.0000 meq | EXTENDED_RELEASE_TABLET | Freq: Once | ORAL | Status: AC
  Administered 2011-07-25: 40 meq via ORAL
  Filled 2011-07-25: qty 2

## 2011-07-25 NOTE — ED Notes (Signed)
Pt here under IVC with [facility] - per papers, pt is paranoid, talking to t.v.  Papers state pt carries gun with him at all times and family is fearful for pt's safety.  Pt denies SI/HI, denies hallucinations/delusions.
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
## 2011-07-25 NOTE — ED Notes (Signed)
[facility] has left and sitter to bedside, pt was provided crackers by staff

## 2011-08-10 ENCOUNTER — Emergency Department (HOSPITAL_COMMUNITY)
Admission: EM | Admit: 2011-08-10 | Discharge: 2011-08-20 | Disposition: A | Discharge status: Other Acute Inpatient Hospital | Payer: Medicare PPO | Attending: Emergency Medicine | Admitting: Emergency Medicine

## 2011-08-10 ENCOUNTER — Encounter (HOSPITAL_COMMUNITY): Payer: Self-pay | Admitting: *Deleted

## 2011-08-10 DIAGNOSIS — M549 Dorsalgia, unspecified: Secondary | ICD-10-CM | POA: Insufficient documentation

## 2011-08-10 DIAGNOSIS — F319 Bipolar disorder, unspecified: Secondary | ICD-10-CM | POA: Insufficient documentation

## 2011-08-10 DIAGNOSIS — F17200 Nicotine dependence, unspecified, uncomplicated: Secondary | ICD-10-CM | POA: Insufficient documentation

## 2011-08-10 DIAGNOSIS — F29 Unspecified psychosis not due to a substance or known physiological condition: Secondary | ICD-10-CM | POA: Insufficient documentation

## 2011-08-10 DIAGNOSIS — R45850 Homicidal ideations: Secondary | ICD-10-CM | POA: Insufficient documentation

## 2011-08-10 DIAGNOSIS — IMO0002 Reserved for concepts with insufficient information to code with codable children: Secondary | ICD-10-CM | POA: Insufficient documentation

## 2011-08-10 LAB — BASIC METABOLIC PANEL
BUN: 7 mg/dL (ref 6–23)
Chloride: 104 mEq/L (ref 96–112)
GFR calc Af Amer: >90 mL/min (ref >90)
GFR calc non Af Amer: >90 mL/min (ref >90)
Potassium: 3.8 mEq/L (ref 3.5–5.1)
Sodium: 139 mEq/L (ref 135–145)

## 2011-08-10 LAB — DIFFERENTIAL
Basophils Relative: 0 % (ref 0–1)
Eosinophils Absolute: 0.1 10*3/uL (ref 0.0–0.7)
Lymphs Abs: 3 10*3/uL (ref 0.7–4.0)
Monocytes Relative: 7 % (ref 3–12)
Neutro Abs: 8.1 10*3/uL — ABNORMAL HIGH (ref 1.7–7.7)
Neutrophils Relative %: 68 % (ref 43–77)

## 2011-08-10 LAB — CBC
Hemoglobin: 15.3 g/dL (ref 13.0–17.0)
MCHC: 35.1 g/dL (ref 30.0–36.0)
Platelets: 240 10*3/uL (ref 150–400)
RBC: 5.12 MIL/uL (ref 4.22–5.81)

## 2011-08-10 LAB — RAPID URINE DRUG SCREEN, HOSP PERFORMED
Amphetamines: NOT DETECTED
Barbiturates: NOT DETECTED
Tetrahydrocannabinol: NOT DETECTED

## 2011-08-10 MED ORDER — CLONAZEPAM 0.5 MG PO TABS
1.0000 mg | ORAL_TABLET | Freq: Once | ORAL | Status: AC
  Administered 2011-08-10: 1 mg via ORAL

## 2011-08-10 MED ORDER — NICOTINE 21 MG/24HR TD PT24
21.0000 mg | MEDICATED_PATCH | Freq: Once | TRANSDERMAL | Status: AC
  Administered 2011-08-10 – 2011-08-11 (×2): 21 mg via TRANSDERMAL
  Filled 2011-08-10 (×2): qty 1

## 2011-08-10 MED ORDER — CLONAZEPAM 0.5 MG PO TABS
1.0000 mg | ORAL_TABLET | Freq: Every day | ORAL | Status: DC
  Administered 2011-08-11: 1 mg via ORAL
  Filled 2011-08-10: qty 2

## 2011-08-10 MED ORDER — CLONAZEPAM 0.5 MG PO TABS
ORAL_TABLET | ORAL | Status: AC
  Administered 2011-08-10: 1 mg via ORAL
  Filled 2011-08-10: qty 2

## 2011-08-10 MED ORDER — ZIPRASIDONE MESYLATE 20 MG IM SOLR
INTRAMUSCULAR | Status: AC
  Filled 2011-08-10: qty 20

## 2011-08-10 MED ORDER — ZIPRASIDONE MESYLATE 20 MG IM SOLR: 20.0000 mg | Freq: Once | INTRAMUSCULAR | Status: DC

## 2011-08-10 NOTE — ED Notes (Signed)
Leigh from [facility] telephoned to notify pt is on their wait list.

## 2011-08-10 NOTE — ED Notes (Signed)
Sitting up on stretcher eating dinner; states, "I need something for pain".  C/o lower back pain radiating down left leg; wearing Fentanyl patch; in no apparent distress; will notify EDP.
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
## 2011-08-10 NOTE — ED Notes (Signed)
Patient became agitated - talking very loudly- states he needs his medication NOW requesting clonazepam and two hydrocodone 10-325.  He is aware he is wearing his Fentanyl patch.  States he has severe lower back pain, chronic. Advised
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
## 2011-08-11 MED ORDER — EPINEPHRINE HCL 0.1 MG/ML IJ SOLN
INTRAMUSCULAR | Status: AC
  Filled 2011-08-11: qty 10

## 2011-08-11 MED ORDER — ATROPINE SULFATE 0.1 MG/ML IJ SOLN
INTRAMUSCULAR | Status: AC
  Filled 2011-08-11: qty 10

## 2011-08-11 MED ORDER — CLONAZEPAM 0.5 MG PO TABS
2.0000 mg | ORAL_TABLET | Freq: Two times a day (BID) | ORAL | Status: DC
  Administered 2011-08-11 – 2011-08-16 (×10): 2 mg via ORAL
  Filled 2011-08-11 (×6): qty 4

## 2011-08-11 NOTE — ED Notes (Signed)
Pt's son called and requesting info about how long pt will be here and plan to where pt will be placed, requested that staff call him back once pt is transferred; Bentlie Catanzaro (pt's son) # [phone]

## 2011-08-11 NOTE — ED Notes (Signed)
Awake - requesting food.  Has eaten several packs of crackers and sprite. Advised will serve breakfast in a couple of hours.  Remains under observation of Deputy with chain / cuff to foot of stretcher.  Encouraged to try to get some sleep

## 2011-08-11 NOTE — ED Notes (Signed)
See downtime charting. 

## 2011-08-12 NOTE — ED Notes (Signed)
AC called for med due at 2200. Pt up to go to bathroom. Security at door of bathroom.

## 2011-08-12 NOTE — ED Notes (Signed)
Resting with no complaints at this time. Emergency planning/management officer at bedside.
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
## 2011-08-12 NOTE — ED Notes (Signed)
Pt resting calmly w/ eyes closed. Rise & fall of the chest noted. Bed in low position, side rails up x2. NAD noted at this time.  [employer] w/ pt.

## 2011-08-12 NOTE — ED Notes (Signed)
Emergency planning/management officer at bedside. Pt sleeping at present.

## 2011-08-12 NOTE — Progress Notes (Signed)
1446 Patient with suicidal and homicidal ideation. Awaiting placement at [facility]. Request placed for telepsych consult to see if patient still meets criteria for IVC placement at [facility]. Evaluated by Dr. Jacky Kindle. He feels patient needs to be sent to [facility]. His evaluation is on the chart.

## 2011-08-12 NOTE — ED Notes (Signed)
Pt sitting in chair in room. Pt has finished with his bathing and teeth brushed. Linens changed. Emergency planning/management officer at bedside.

## 2011-08-12 NOTE — ED Notes (Signed)
Pt c/o pain in his lower back. MD notified.

## 2011-08-12 NOTE — ED Notes (Signed)
Ambulatory to bathroom accompanied by officer Sport and exercise psychologist)

## 2011-08-12 NOTE — ED Notes (Signed)
Emergency planning/management officer at bedside. Pt still sleeping.

## 2011-08-12 NOTE — ED Notes (Signed)
Pt resting, NAD noted. Security officers at bedside.

## 2011-08-12 NOTE — ED Notes (Signed)
Pt having tele psych evaluation at this time. Emergency planning/management officer at bedside. Pt sleeping at intervals.

## 2011-08-12 NOTE — ED Notes (Signed)
Pt states that he feels drained and needs some sugar. Notified that his meal tray will be here shortly. Pt is calm and cooperative at this time. Pt notified that we are waiting for Tele Psych evaluation. Police officers at bedside.

## 2011-08-12 NOTE — ED Notes (Signed)
Pt sleeping at present. Emergency planning/management officer at bedside.

## 2011-08-12 NOTE — ED Notes (Signed)
Pt to restroom w/ [employer] at pt side. Pt returned to room w/ no complications.
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
## 2011-08-12 NOTE — ED Notes (Signed)
Pt sleeping at times. Emergency planning/management officer at bedside. Awaiting placement.
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
## 2011-08-12 NOTE — ED Notes (Signed)
Pt voided in urinal. Pt sleeping at intervals with no complaints at this time.

## 2011-08-12 NOTE — ED Notes (Signed)
Pt resting calmly w/ eyes closed. Rise & fall of the chest noted. Bed in low position, side rails up x2. NAD noted at this time.  [facility]sd remains w/ pt.

## 2011-08-13 NOTE — ED Notes (Signed)
Pt resting comfortably, nad noted.  [facility] at bedside.

## 2011-08-13 NOTE — ED Notes (Signed)
Son stated he would like to know when and where patient is transferred to. Son is Jarell Mcewen, number [phone].
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
## 2011-08-13 NOTE — ED Notes (Signed)
Received report from night shift RN that pt received a phone call from family member stating that pt's "girlfriend" past away last night.  Questioned pt regarding acquaintance with deceased, pt reports girlfriend from past, ,denies relationship with deceased presently.  Informed pt of death.  Pt calm, cooperative, thankful for information, showing no grief.  [facility] at bedside.  Pt remains shackled to bed.  CMS intact in lower extremities.  Pt also reports having large BM this morning, denies needing more meds for BM.  nad noted.

## 2011-08-13 NOTE — ED Notes (Signed)
Patient does not need anything at this time. 

## 2011-08-13 NOTE — ED Notes (Signed)
[facility] at bedside, right ankle shackled to bed, CMS intact.  nad noted.

## 2011-08-13 NOTE — ED Notes (Signed)
Lunch tray given.  Warm blanket also given.  nad noted.  [facility] at bedside.

## 2011-08-13 NOTE — ED Notes (Signed)
Family member at bedside.

## 2011-08-13 NOTE — ED Notes (Signed)
Pt reports to NT that he has not had BM in 2 days, requesting meds for bm.  edp notified.

## 2011-08-13 NOTE — ED Notes (Signed)
Pt up to bathroom with deputy.  nad noted. 

## 2011-08-13 NOTE — ED Notes (Signed)
Officer stated patient did not want to walk to the bathroom and is requesting a urinal. Gave patient urinal as requested.

## 2011-08-13 NOTE — ED Notes (Signed)
Pt requesting to speak to ACT stating, "I've been here for 5 days and haven't spoken to anybody."  Samson Frederic with [facility] notified that pt wanted to speak to her.  Informed pt that he has spoken with someone and that he is pending a bed a [facility].  Pt verbalized understanding.  Pt right ankle cuffed to bed.  CMS intact, [facility] at bedside.  Pt's son here to visit.

## 2011-08-14 MED ORDER — HYDROCODONE-ACETAMINOPHEN 5-325 MG PO TABS
2.0000 | ORAL_TABLET | Freq: Once | ORAL | Status: AC
  Administered 2011-08-14: 2 via ORAL

## 2011-08-14 MED ORDER — HYDROCODONE-ACETAMINOPHEN 5-325 MG PO TABS
ORAL_TABLET | ORAL | Status: AC
  Filled 2011-08-14: qty 2

## 2011-08-14 NOTE — ED Notes (Signed)
Pt requesting apple juice at this time. Request granted.

## 2011-08-14 NOTE — ED Notes (Signed)
Pt requesting pain medicine for low back pain.  Notified Dr. Judd Lien and order given.

## 2011-08-14 NOTE — ED Notes (Signed)
Pt states back pain is increasing.  Dr Adriana Simas notified.
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
## 2011-08-15 ENCOUNTER — Encounter (HOSPITAL_COMMUNITY): Payer: Self-pay | Admitting: *Deleted

## 2011-08-15 MED ORDER — HYDROCODONE-ACETAMINOPHEN 5-325 MG PO TABS
ORAL_TABLET | ORAL | Status: AC
  Administered 2011-08-15: 03:00:00
  Filled 2011-08-15: qty 2

## 2011-08-15 MED ORDER — NICOTINE 21 MG/24HR TD PT24
21.0000 mg | MEDICATED_PATCH | Freq: Every day | TRANSDERMAL | Status: DC
  Administered 2011-08-15 – 2011-08-20 (×6): 21 mg via TRANSDERMAL
  Filled 2011-08-15 (×5): qty 1

## 2011-08-15 MED ORDER — IBUPROFEN 800 MG PO TABS
800.0000 mg | ORAL_TABLET | Freq: Three times a day (TID) | ORAL | Status: DC
  Administered 2011-08-15 – 2011-08-17 (×6): 800 mg via ORAL
  Filled 2011-08-15 (×11): qty 1

## 2011-08-15 NOTE — Progress Notes (Signed)
08/15/11  2005  Updated petition.  Pt is on wait list for [facility]. Dr Manus Gunning agrees with disposition.

## 2011-08-15 NOTE — ED Notes (Signed)
Patient requesting pain meds. Informed md of this and no meds  ordered

## 2011-08-15 NOTE — ED Notes (Signed)
Pt resting in bed, even rise and fall of chest, in NAD. Officer remains at bedside. Will continue to monitor. 

## 2011-08-15 NOTE — ED Notes (Signed)
Pt stated he wanted his medication. The pt was told that he will receive his medications as scheduled. The pt became upset and made comments when this nurse was walking away. This nurse explained again to the pt that he will receive the medication that the physician orders.

## 2011-08-16 MED ORDER — ALPRAZOLAM 0.5 MG PO TABS
1.0000 mg | ORAL_TABLET | Freq: Three times a day (TID) | ORAL | Status: DC
  Administered 2011-08-16 – 2011-08-20 (×10): 1 mg via ORAL
  Filled 2011-08-16: qty 2

## 2011-08-16 MED ORDER — ASPIRIN EC 325 MG PO TBEC
325.0000 mg | DELAYED_RELEASE_TABLET | Freq: Two times a day (BID) | ORAL | Status: DC
  Administered 2011-08-16 – 2011-08-20 (×8): 325 mg via ORAL
  Filled 2011-08-16 (×7): qty 1

## 2011-08-16 NOTE — ED Notes (Signed)
Pt given a meal tray 

## 2011-08-16 NOTE — ED Notes (Signed)
Pt up to bathroom with deputy.  States has been unable to void because "his kidneys are shutting down because he has not had his fentanyl patch."  Per RN report from previous shift, issue has already been addressed by edp.  Breakfast tray given.

## 2011-08-16 NOTE — ED Notes (Signed)
Pt given a meal tray.  Pt is calm and cooperative.

## 2011-08-16 NOTE — ED Notes (Signed)
Pt resting in bed at present.  Pt eyes closed with rr of 18 and even.

## 2011-08-16 NOTE — ED Notes (Signed)
Pt given a sprite 

## 2011-08-16 NOTE — ED Notes (Signed)
Connie with [facility] called to check on pt. He's still on the waiting list and she will call back when she get a room.

## 2011-08-16 NOTE — ED Notes (Signed)
Pt resting in bed with eyes closed, in NAD. Sitter outside door

## 2011-08-16 NOTE — Progress Notes (Signed)
Called to [facility], spoke with Elnita Maxwell. [name] [name] remains on the wait list. He is about mid-way up the list at this time. Discharges to be decided later this a.m.,staff will call when [name] [name] has been accepted for admission.

## 2011-08-16 NOTE — ED Notes (Signed)
Pt sleeping with snoring respirations.  Right ankle shackled to bed.  CMS intact.  [facility] at bedside.  nad noted.

## 2011-08-16 NOTE — ED Notes (Signed)
Pt resting in bed at  Present.  Pt eyes closed and rr of 18 and even

## 2011-08-16 NOTE — ED Notes (Signed)
Pt reports voiding while up to bathroom.

## 2011-08-16 NOTE — ED Notes (Signed)
Pt up to bathroom escorted by police officer.

## 2011-08-16 NOTE — ED Notes (Signed)
Pt resting on bed.  Pt talking calm and cooperative at present.

## 2011-08-16 NOTE — Progress Notes (Signed)
1600 Patient continues to await placement at [facility].

## 2011-08-16 NOTE — ED Notes (Signed)
Pt resting in bed at resent.  Eyes closed, RR of 18 and even.

## 2011-08-16 NOTE — ED Notes (Signed)
Pt requesting to see MD, MD aware

## 2011-08-16 NOTE — ED Notes (Signed)
PT RESTING IN BED.  EYES CLOSED AND RR OF 18 AND EVEN

## 2011-08-16 NOTE — ED Notes (Signed)
MD at bedside. 

## 2011-08-17 MED ORDER — AMITRIPTYLINE HCL 25 MG PO TABS
25.0000 mg | ORAL_TABLET | Freq: Every day | ORAL | Status: DC
  Administered 2011-08-17 – 2011-08-19 (×3): 25 mg via ORAL
  Filled 2011-08-17 (×4): qty 1

## 2011-08-17 MED ORDER — IBUPROFEN 400 MG PO TABS
200.0000 mg | ORAL_TABLET | Freq: Four times a day (QID) | ORAL | Status: DC | PRN
  Administered 2011-08-17: 200 mg via ORAL

## 2011-08-17 MED ORDER — HYDROCODONE-ACETAMINOPHEN 5-325 MG PO TABS
2.0000 | ORAL_TABLET | Freq: Four times a day (QID) | ORAL | Status: DC | PRN
  Administered 2011-08-18 – 2011-08-20 (×4): 2 via ORAL

## 2011-08-17 MED ORDER — HYDROCODONE-ACETAMINOPHEN 5-325 MG PO TABS
2.0000 | ORAL_TABLET | Freq: Once | ORAL | Status: AC
  Administered 2011-08-17: 2 via ORAL

## 2011-08-17 MED ORDER — CLONAZEPAM 0.5 MG PO TABS: 1.0000 mg | ORAL_TABLET | Freq: Two times a day (BID) | ORAL | Status: DC | PRN

## 2011-08-17 MED ORDER — FENTANYL 50 MCG/HR TD PT72
50.0000 ug | MEDICATED_PATCH | TRANSDERMAL | Status: DC
  Administered 2011-08-17: 50 ug via TRANSDERMAL
  Filled 2011-08-17: qty 1

## 2011-08-17 NOTE — Progress Notes (Signed)
1450 Patient is here awaiting placement to [facility]. He was given analgesics last night for a c/o back pain. It is the first narcotic given him since arrival. He is now asking for both narcotic and benzodiazepines.  Renewed paperwork on the chart.No behavioral  issues

## 2011-08-17 NOTE — ED Notes (Signed)
Pt reports " i need my pain medicine".  Notified edp

## 2011-08-17 NOTE — ED Notes (Signed)
Pt resting in bed.  Pt agitated because he wants more Vicodin.

## 2011-08-17 NOTE — ED Notes (Signed)
Patient given breakfast tray and is eating at this time.

## 2011-08-17 NOTE — ED Notes (Signed)
Patient is resting comfortably. Meal tray given

## 2011-08-17 NOTE — ED Notes (Signed)
At this time patient just wanted something to drink. Two cups of Sprite were given at this time. Patient states he is comfortable.
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
## 2011-08-17 NOTE — ED Provider Notes (Signed)
  Physical Exam  BP 135/71  Pulse 82  Temp(Src) 97.8 F (36.6 C) (Oral)  Resp 18  Ht 5\' 7"  (1.702 m)  Wt 175 lb (79.379 kg)  BMI 27.41 kg/m2  SpO2 100%  Physical Exam  ED Course  Procedures  MDM Discussed with patient. He states he has not been getting his pain medications. His drug database records were looked up and he has been receiving medicines away from his doctors. His been getting fentanyl and Norco from Dr. Channing Mutters. He was restarted on his medicines, also right slightly lower doses because he has been here for several days without them.      Juliet Rude. Rubin Payor, MD 08/17/11 1644

## 2011-08-18 MED ORDER — DOCUSATE SODIUM 100 MG PO CAPS
100.0000 mg | ORAL_CAPSULE | Freq: Once | ORAL | Status: DC
  Filled 2011-08-18: qty 1

## 2011-08-18 MED ORDER — FENTANYL 50 MCG/HR TD PT72
50.0000 ug | MEDICATED_PATCH | TRANSDERMAL | Status: DC
  Administered 2011-08-18: 50 ug via TRANSDERMAL
  Filled 2011-08-18: qty 1

## 2011-08-18 MED ORDER — DOCUSATE SODIUM 50 MG/5ML PO LIQD
100.0000 mg | Freq: Once | ORAL | Status: AC
  Administered 2011-08-18: 100 mg via ORAL
  Filled 2011-08-18: qty 10

## 2011-08-18 MED ORDER — DOCUSATE SODIUM 50 MG/5ML PO LIQD
ORAL | Status: AC
  Filled 2011-08-18: qty 10

## 2011-08-18 MED ORDER — IBUPROFEN 800 MG PO TABS: 800.0000 mg | ORAL_TABLET | Freq: Three times a day (TID) | ORAL | Status: DC | PRN

## 2011-08-18 MED ORDER — DOCUSATE SODIUM 100 MG PO CAPS
100.0000 mg | ORAL_CAPSULE | Freq: Two times a day (BID) | ORAL | Status: DC | PRN
  Administered 2011-08-18 – 2011-08-20 (×3): 100 mg via ORAL
  Filled 2011-08-18 (×5): qty 1

## 2011-08-18 MED ORDER — CALCIUM CARBONATE ANTACID 500 MG PO CHEW
1.0000 | CHEWABLE_TABLET | ORAL | Status: DC | PRN
  Administered 2011-08-18: 200 mg via ORAL
  Filled 2011-08-18 (×3): qty 1

## 2011-08-18 NOTE — ED Notes (Signed)
Contacted ac regarding tums and colace per patient request for meds.

## 2011-08-18 NOTE — ED Notes (Signed)
Patient is resting comfortably. 

## 2011-08-18 NOTE — ED Notes (Signed)
Patient is resting comfortably. Denies any pain or complaints at this time.

## 2011-08-18 NOTE — ED Notes (Signed)
Pt woke and offered breakfast. Pt became agitated and demanded pain medication. Pt has slurred speech and is groggy. Pt given medication as requested and ordered.

## 2011-08-18 NOTE — Progress Notes (Signed)
1500 Patient awaiting bed at [facility]. He was restarted on his chronic pain medicines last night. Had a shower today and had to replace the fentanyl patch. No behavioral issues.

## 2011-08-18 NOTE — ED Provider Notes (Signed)
6:37 AM Patient was restarted on Colace for constipation due to patient's chronic narcotic use for back pain. There've been no other issues overnight. He continues to await transfer to [facility].   Hanley Seamen, MD 08/18/11 [phone]

## 2011-08-19 MED ORDER — ARIPIPRAZOLE 10 MG PO TABS
10.0000 mg | ORAL_TABLET | Freq: Every day | ORAL | Status: DC
  Administered 2011-08-19 – 2011-08-20 (×2): 10 mg via ORAL
  Filled 2011-08-19 (×3): qty 1

## 2011-08-19 NOTE — ED Notes (Signed)
Patient is resting comfortably. 

## 2011-08-19 NOTE — ED Notes (Signed)
Snack given.

## 2011-08-19 NOTE — ED Notes (Signed)
Pt eating breakfast tray.  bil ankles cuffed by [employer], CMS intact, no skin breakdown around ankles.  [facility] at bedside.  nad noted.

## 2011-08-19 NOTE — ED Notes (Signed)
Report given to elizabeth, rn

## 2011-08-19 NOTE — ED Notes (Signed)
Pt requesting meds for BM.  Meds given.  Dinner tray given.  nad noted.  [employer] at bedside.  bil ankles shackled, CMS intact, no skin breakdown.  nad noted.

## 2011-08-19 NOTE — ED Notes (Signed)
Pt up to bathroom with [employer].  nad noted. 
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
## 2011-08-19 NOTE — ED Notes (Signed)
Pt up to bathroom with deputy.  nad noted.

## 2011-08-20 NOTE — ED Notes (Signed)
Patient lying in bed with eyes closed. NAD noted. Will continue to monitor.
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
## 2011-08-20 NOTE — ED Notes (Signed)
Lorine Bears, RN to accept patient to [facility]. Report called and [name] ready to receive patient.

## 2011-08-20 NOTE — ED Notes (Signed)
Patient left ED at this time with [employer] officer with steady gait. NAD noted upon departure.

## 2011-08-20 NOTE — ED Notes (Signed)
Patient provided hot lunch tray. Denies any other needs at this time.

## 2011-08-20 NOTE — ED Notes (Signed)
Patient offered shower. Supplied provided for shower, oral hygiene and new blue scrubs. [name], ED tech and [employer] officer accompanied patient to shower and will supervise patient. Patient remains cooperative at this time.

## 2011-08-20 NOTE — ED Notes (Signed)
Room cleaned and linens changed while patient showering. Patient provided with new blankets as well.

## 2011-08-20 NOTE — ED Notes (Signed)
[name] [name] with [facility] called. Patient has bed. [name] needs paperwork (new IVC, vital signs sheet, and MAR). All forms gathered and faxed to [facility] by Misty Stanley, ED tech. Awaiting to hear from [place] about bed assignment after she receives paperwork.

## 2011-08-20 NOTE — ED Notes (Signed)
Patient provided hot breakfast tray.

## 2012-02-13 ENCOUNTER — Other Ambulatory Visit: Payer: Self-pay

## 2012-02-13 ENCOUNTER — Inpatient Hospital Stay (HOSPITAL_COMMUNITY)
Admission: EM | Admit: 2012-02-13 | Discharge: 2012-02-17 | DRG: 683 | Payer: Medicare PPO | Attending: Internal Medicine | Admitting: Internal Medicine

## 2012-02-13 ENCOUNTER — Encounter (HOSPITAL_COMMUNITY): Payer: Self-pay | Admitting: *Deleted

## 2012-02-13 DIAGNOSIS — M545 Low back pain, unspecified: Secondary | ICD-10-CM | POA: Diagnosis present

## 2012-02-13 DIAGNOSIS — F411 Generalized anxiety disorder: Secondary | ICD-10-CM

## 2012-02-13 DIAGNOSIS — F29 Unspecified psychosis not due to a substance or known physiological condition: Secondary | ICD-10-CM | POA: Diagnosis present

## 2012-02-13 DIAGNOSIS — E871 Hypo-osmolality and hyponatremia: Secondary | ICD-10-CM | POA: Diagnosis present

## 2012-02-13 DIAGNOSIS — M6282 Rhabdomyolysis: Secondary | ICD-10-CM | POA: Diagnosis present

## 2012-02-13 DIAGNOSIS — Z96649 Presence of unspecified artificial hip joint: Secondary | ICD-10-CM

## 2012-02-13 DIAGNOSIS — M549 Dorsalgia, unspecified: Secondary | ICD-10-CM | POA: Diagnosis present

## 2012-02-13 DIAGNOSIS — E860 Dehydration: Secondary | ICD-10-CM | POA: Diagnosis present

## 2012-02-13 DIAGNOSIS — F17200 Nicotine dependence, unspecified, uncomplicated: Secondary | ICD-10-CM | POA: Diagnosis present

## 2012-02-13 DIAGNOSIS — G039 Meningitis, unspecified: Secondary | ICD-10-CM

## 2012-02-13 DIAGNOSIS — Z79899 Other long term (current) drug therapy: Secondary | ICD-10-CM

## 2012-02-13 DIAGNOSIS — M129 Arthropathy, unspecified: Secondary | ICD-10-CM

## 2012-02-13 DIAGNOSIS — IMO0002 Reserved for concepts with insufficient information to code with codable children: Secondary | ICD-10-CM

## 2012-02-13 DIAGNOSIS — R5381 Other malaise: Secondary | ICD-10-CM

## 2012-02-13 DIAGNOSIS — E663 Overweight: Secondary | ICD-10-CM

## 2012-02-13 DIAGNOSIS — E785 Hyperlipidemia, unspecified: Secondary | ICD-10-CM

## 2012-02-13 DIAGNOSIS — Z720 Tobacco use: Secondary | ICD-10-CM | POA: Diagnosis present

## 2012-02-13 DIAGNOSIS — K219 Gastro-esophageal reflux disease without esophagitis: Secondary | ICD-10-CM | POA: Diagnosis present

## 2012-02-13 DIAGNOSIS — F1410 Cocaine abuse, uncomplicated: Secondary | ICD-10-CM | POA: Diagnosis present

## 2012-02-13 DIAGNOSIS — N390 Urinary tract infection, site not specified: Secondary | ICD-10-CM | POA: Diagnosis present

## 2012-02-13 DIAGNOSIS — G43909 Migraine, unspecified, not intractable, without status migrainosus: Secondary | ICD-10-CM

## 2012-02-13 DIAGNOSIS — N179 Acute kidney failure, unspecified: Principal | ICD-10-CM | POA: Diagnosis present

## 2012-02-13 DIAGNOSIS — F319 Bipolar disorder, unspecified: Secondary | ICD-10-CM | POA: Diagnosis present

## 2012-02-13 DIAGNOSIS — F329 Major depressive disorder, single episode, unspecified: Secondary | ICD-10-CM

## 2012-02-13 DIAGNOSIS — G8929 Other chronic pain: Secondary | ICD-10-CM | POA: Diagnosis present

## 2012-02-13 LAB — CK: Total CK: 7877 U/L — ABNORMAL HIGH (ref 7–232)

## 2012-02-13 LAB — COMPREHENSIVE METABOLIC PANEL
AST: 43 U/L — ABNORMAL HIGH (ref 0–37)
Albumin: 4.6 g/dL (ref 3.5–5.2)
Alkaline Phosphatase: 74 U/L (ref 39–117)
BUN: 13 mg/dL (ref 6–23)
CO2: 23 mEq/L (ref 19–32)
Chloride: 93 mEq/L — ABNORMAL LOW (ref 96–112)
Creatinine, Ser: 1.32 mg/dL (ref 0.50–1.35)
GFR calc non Af Amer: 64 mL/min — ABNORMAL LOW (ref >90)
Potassium: 3.5 mEq/L (ref 3.5–5.1)
Total Bilirubin: 0.5 mg/dL (ref 0.3–1.2)

## 2012-02-13 LAB — CBC WITH DIFFERENTIAL/PLATELET
Basophils Absolute: 0 10*3/uL (ref 0.0–0.1)
Eosinophils Relative: 0 % (ref 0–5)
HCT: 46.5 % (ref 39.0–52.0)
Lymphocytes Relative: 5 % — ABNORMAL LOW (ref 12–46)
Lymphs Abs: 1.3 10*3/uL (ref 0.7–4.0)
MCV: 84.4 fL (ref 78.0–100.0)
Monocytes Relative: 6 % (ref 3–12)
Neutro Abs: 23.6 10*3/uL — ABNORMAL HIGH (ref 1.7–7.7)
RBC: 5.51 MIL/uL (ref 4.22–5.81)
RDW: 12 % (ref 11.5–15.5)
WBC: 26.5 10*3/uL — ABNORMAL HIGH (ref 4.0–10.5)

## 2012-02-13 LAB — URINALYSIS, ROUTINE W REFLEX MICROSCOPIC
Glucose, UA: NEGATIVE mg/dL
Ketones, ur: 15 mg/dL — AB
Protein, ur: 100 mg/dL — AB
pH: 6 (ref 5.0–8.0)

## 2012-02-13 LAB — RAPID URINE DRUG SCREEN, HOSP PERFORMED
Amphetamines: NOT DETECTED
Barbiturates: NOT DETECTED
Benzodiazepines: POSITIVE — AB
Tetrahydrocannabinol: POSITIVE — AB

## 2012-02-13 LAB — URINE MICROSCOPIC-ADD ON

## 2012-02-13 LAB — TROPONIN I: Troponin I: <0.3 ng/mL (ref <0.30)

## 2012-02-13 MED ORDER — LORAZEPAM 2 MG/ML IJ SOLN
2.0000 mg | Freq: Once | INTRAMUSCULAR | Status: AC
  Administered 2012-02-13: 2 mg via INTRAMUSCULAR
  Filled 2012-02-13: qty 1

## 2012-02-13 MED ORDER — SODIUM CHLORIDE 0.9 % IV SOLN
1000.0000 mL | INTRAVENOUS | Status: DC
  Administered 2012-02-13: 1000 mL via INTRAVENOUS

## 2012-02-13 MED ORDER — HYDROCODONE-ACETAMINOPHEN 5-325 MG PO TABS
2.0000 | ORAL_TABLET | Freq: Once | ORAL | Status: AC
  Administered 2012-02-13: 2 via ORAL

## 2012-02-13 MED ORDER — NICOTINE 21 MG/24HR TD PT24
21.0000 mg | MEDICATED_PATCH | Freq: Once | TRANSDERMAL | Status: AC
  Administered 2012-02-13: 21 mg via TRANSDERMAL
  Filled 2012-02-13: qty 1

## 2012-02-13 MED ORDER — SODIUM CHLORIDE 0.9 % IV SOLN
1000.0000 mL | Freq: Once | INTRAVENOUS | Status: AC
  Administered 2012-02-13: 1000 mL via INTRAVENOUS

## 2012-02-13 MED ORDER — DEXTROSE 5 % IV SOLN
1.0000 g | Freq: Once | INTRAVENOUS | Status: AC
  Administered 2012-02-13: 1 g via INTRAVENOUS
  Filled 2012-02-13: qty 10

## 2012-02-13 MED ORDER — ZIPRASIDONE MESYLATE 20 MG IM SOLR
10.0000 mg | Freq: Once | INTRAMUSCULAR | Status: AC
  Administered 2012-02-13: 10 mg via INTRAMUSCULAR
  Filled 2012-02-13: qty 20

## 2012-02-13 MED ORDER — NICOTINE 21 MG/24HR TD PT24
21.0000 mg | MEDICATED_PATCH | Freq: Every day | TRANSDERMAL | Status: DC | PRN
  Administered 2012-02-14 – 2012-02-17 (×3): 21 mg via TRANSDERMAL
  Filled 2012-02-13 (×4): qty 1

## 2012-02-13 MED ORDER — FENTANYL 50 MCG/HR TD PT72
50.0000 ug | MEDICATED_PATCH | TRANSDERMAL | Status: DC
  Administered 2012-02-13 – 2012-02-16 (×2): 50 ug via TRANSDERMAL
  Filled 2012-02-13 (×2): qty 1

## 2012-02-13 MED ORDER — HYDROCODONE-ACETAMINOPHEN 5-325 MG PO TABS
ORAL_TABLET | ORAL | Status: AC
  Filled 2012-02-13: qty 2
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
## 2012-02-13 NOTE — ED Notes (Signed)
Pt was able to clean his feet and legs.
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
## 2012-02-13 NOTE — ED Notes (Signed)
Heard pt yelling, swearing and becoming hostile during tele-psych interview. Able to calm [name] [name] down somewhat for a short time, however became even more aggressive and hostile. Dr Lars Mage notified.

## 2012-02-14 ENCOUNTER — Encounter (HOSPITAL_COMMUNITY): Payer: Self-pay | Admitting: *Deleted

## 2012-02-14 ENCOUNTER — Inpatient Hospital Stay (HOSPITAL_COMMUNITY): Payer: Medicare PPO

## 2012-02-14 DIAGNOSIS — E860 Dehydration: Secondary | ICD-10-CM

## 2012-02-14 DIAGNOSIS — F1410 Cocaine abuse, uncomplicated: Secondary | ICD-10-CM

## 2012-02-14 LAB — CBC
MCHC: 35.2 g/dL (ref 30.0–36.0)
RDW: 12.3 % (ref 11.5–15.5)

## 2012-02-14 LAB — COMPREHENSIVE METABOLIC PANEL
ALT: 20 U/L (ref 0–53)
ALT: 23 U/L (ref 0–53)
Albumin: 3.7 g/dL (ref 3.5–5.2)
Albumin: 3.6 g/dL (ref 3.5–5.2)
Alkaline Phosphatase: 65 U/L (ref 39–117)
Alkaline Phosphatase: 68 U/L (ref 39–117)
BUN: 12 mg/dL (ref 6–23)
BUN: 9 mg/dL (ref 6–23)
Chloride: 108 mEq/L (ref 96–112)
Potassium: 3.5 mEq/L (ref 3.5–5.1)
Potassium: 3.8 mEq/L (ref 3.5–5.1)
Sodium: 142 mEq/L (ref 135–145)
Total Bilirubin: 0.4 mg/dL (ref 0.3–1.2)
Total Protein: 6.2 g/dL (ref 6.0–8.3)

## 2012-02-14 LAB — TSH: TSH: 1.016 u[IU]/mL (ref 0.350–4.500)

## 2012-02-14 LAB — CK: Total CK: 19192 U/L — ABNORMAL HIGH (ref 7–232)

## 2012-02-14 MED ORDER — FENTANYL 50 MCG/HR TD PT72: 50.0000 ug | MEDICATED_PATCH | TRANSDERMAL | Status: DC

## 2012-02-14 MED ORDER — SODIUM CHLORIDE 0.9 % IV BOLUS (SEPSIS)
500.0000 mL | Freq: Once | INTRAVENOUS | Status: AC
  Administered 2012-02-14: 500 mL via INTRAVENOUS

## 2012-02-14 MED ORDER — POTASSIUM CHLORIDE IN NACL 20-0.9 MEQ/L-% IV SOLN
INTRAVENOUS | Status: DC
  Administered 2012-02-14 – 2012-02-17 (×8): via INTRAVENOUS

## 2012-02-14 MED ORDER — BISACODYL 5 MG PO TBEC
5.0000 mg | DELAYED_RELEASE_TABLET | Freq: Every day | ORAL | Status: DC | PRN
  Administered 2012-02-14: 5 mg via ORAL
  Filled 2012-02-14: qty 1

## 2012-02-14 MED ORDER — LORAZEPAM 2 MG/ML IJ SOLN
2.0000 mg | Freq: Three times a day (TID) | INTRAMUSCULAR | Status: DC | PRN
  Administered 2012-02-16: 2 mg via INTRAMUSCULAR
  Filled 2012-02-14: qty 1

## 2012-02-14 MED ORDER — HYDROMORPHONE HCL PF 1 MG/ML IJ SOLN
0.5000 mg | INTRAMUSCULAR | Status: DC | PRN
  Administered 2012-02-14 – 2012-02-16 (×10): 0.5 mg via INTRAVENOUS
  Filled 2012-02-14 (×10): qty 1

## 2012-02-14 MED ORDER — ZIPRASIDONE MESYLATE 20 MG IM SOLR
20.0000 mg | Freq: Three times a day (TID) | INTRAMUSCULAR | Status: DC | PRN
  Filled 2012-02-14: qty 20

## 2012-02-14 MED ORDER — ACETAMINOPHEN 650 MG RE SUPP: 650.0000 mg | Freq: Four times a day (QID) | RECTAL | Status: DC | PRN

## 2012-02-14 MED ORDER — DOCUSATE SODIUM 100 MG PO CAPS
100.0000 mg | ORAL_CAPSULE | Freq: Two times a day (BID) | ORAL | Status: DC
  Administered 2012-02-14 – 2012-02-17 (×7): 100 mg via ORAL
  Filled 2012-02-14 (×7): qty 1

## 2012-02-14 MED ORDER — ONDANSETRON HCL 4 MG/2ML IJ SOLN: 4.0000 mg | Freq: Four times a day (QID) | INTRAMUSCULAR | Status: DC | PRN

## 2012-02-14 MED ORDER — ACETAMINOPHEN 325 MG PO TABS: 650.0000 mg | ORAL_TABLET | ORAL | Status: DC | PRN

## 2012-02-14 MED ORDER — OXYCODONE HCL 5 MG PO TABS
5.0000 mg | ORAL_TABLET | ORAL | Status: DC | PRN
  Administered 2012-02-14 – 2012-02-17 (×10): 5 mg via ORAL
  Filled 2012-02-14 (×11): qty 1

## 2012-02-14 MED ORDER — FLEET ENEMA 7-19 GM/118ML RE ENEM: 1.0000 | ENEMA | Freq: Once | RECTAL | Status: AC | PRN

## 2012-02-14 MED ORDER — ONDANSETRON HCL 4 MG PO TABS: 4.0000 mg | ORAL_TABLET | Freq: Four times a day (QID) | ORAL | Status: DC | PRN

## 2012-02-14 MED ORDER — ZIPRASIDONE HCL 60 MG PO CAPS
60.0000 mg | ORAL_CAPSULE | Freq: Two times a day (BID) | ORAL | Status: DC
  Administered 2012-02-14 – 2012-02-17 (×8): 60 mg via ORAL
  Filled 2012-02-14 (×11): qty 1

## 2012-02-14 MED ORDER — ENOXAPARIN SODIUM 40 MG/0.4ML ~~LOC~~ SOLN
40.0000 mg | SUBCUTANEOUS | Status: DC
  Administered 2012-02-14 – 2012-02-17 (×4): 40 mg via SUBCUTANEOUS
  Filled 2012-02-14 (×4): qty 0.4

## 2012-02-14 NOTE — Progress Notes (Signed)
Subjective: This man was admitted yesterday with an unclear history. He apparently fell and has acute renal failure/rhabdomyolysis. He was found to have cocaine in the urine drug screen. He has a history of bipolar disorder and multiple suicide attempts in the past.           Physical Exam: Blood pressure 120/81, pulse 85, temperature 98 F (36.7 C), temperature source Axillary, resp. rate 20, height 5\' 7"  (1.702 m), weight 78.835 kg (173 lb 12.8 oz), SpO2 99.00%. He systemically does look well. He is complaining of back pain but cannot specify any does not appear to be in pain. Heart sounds are present and normal. Lung fields are clear. Abdomen is soft nontender. He does appear to be alert and orientated.   Investigations:     Basic Metabolic Panel:  Basename 02/14/12 0507 02/13/12 1533  NA 142 131*  K 3.5 3.5  CL 107 93*  CO2 21 23  GLUCOSE 78 77  BUN 12 13  CREATININE 1.72* 1.32  CALCIUM 8.5 9.5  MG -- --  PHOS -- --   Liver Function Tests:  [facility] 02/14/12 0507 02/13/12 1533  AST 141* 43*  ALT 20 12  ALKPHOS 65 74  BILITOT 0.4 0.5  PROT 6.2 7.3  ALBUMIN 3.7 4.6     CBC:  Basename 02/14/12 0507 02/13/12 1533  WBC 12.4* 26.5*  NEUTROABS -- 23.6*  HGB 16.2 17.1*  HCT 46.0 46.5  MCV 86.5 84.4  PLT 178 234    Dg Chest Port 1 View  02/14/2012  *RADIOLOGY REPORT*  Clinical Data: Rhabdomyolysis.  PORTABLE CHEST - 1 VIEW  Comparison: 01/29/2011.  Findings: The heart remains normal in size and the lungs remain clear.  A neural stimulator continues to overlie the lower thoracic spine.  Old, healed left clavicle fracture.  IMPRESSION: No acute abnormality.  Original Report Authenticated By: Darrol Angel, M.D.      Medications:  Scheduled:   . sodium chloride  1,000 mL Intravenous Once   Followed by  . sodium chloride  1,000 mL Intravenous Once  . cefTRIAXone (ROCEPHIN) IVPB 1 gram/50 mL D5W  1 g Intravenous Once  . docusate sodium  100 mg Oral BID    . enoxaparin (LOVENOX) injection  40 mg Subcutaneous Q24H  . fentaNYL  50 mcg Transdermal Q72H  . HYDROcodone-acetaminophen  2 tablet Oral Once  . LORazepam  2 mg Intramuscular Once  . nicotine  21 mg Transdermal Once  . ziprasidone  60 mg Oral BID WC  . ziprasidone  10 mg Intramuscular Once  . DISCONTD: fentaNYL  50 mcg Transdermal Q72H    Impression: 1. Acute renal failure with worsening creatinine. 2. Rhabdomyolysis with significantly elevated CPK. 3. Bipolar affective disorder with psychotic features.     Plan: 1. Continue with rehydration. 2. Check renal function and CPK stat.     LOS: 1 day   Wilson Singer Pager [phone]  02/14/2012, 11:13 AM

## 2012-02-14 NOTE — BHH Counselor (Signed)
Patient has been accepted to [facility]. He will be transported by [facility],  He remains IVC and new paperwork was completed.

## 2012-02-14 NOTE — Progress Notes (Signed)
UR Chart Review Completed  
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
## 2012-02-15 LAB — URINE CULTURE
Colony Count: NO GROWTH
Culture: NO GROWTH

## 2012-02-15 LAB — BASIC METABOLIC PANEL
BUN: 5 mg/dL — ABNORMAL LOW (ref 6–23)
CO2: 22 mEq/L (ref 19–32)
Calcium: 8.4 mg/dL (ref 8.4–10.5)
Creatinine, Ser: 1.36 mg/dL — ABNORMAL HIGH (ref 0.50–1.35)
Glucose, Bld: 107 mg/dL — ABNORMAL HIGH (ref 70–99)

## 2012-02-15 LAB — CK: Total CK: 9982 U/L — ABNORMAL HIGH (ref 7–232)

## 2012-02-15 MED ORDER — NICOTINE 21 MG/24HR TD PT24: 1.0000 | MEDICATED_PATCH | Freq: Every day | TRANSDERMAL | Status: DC | PRN

## 2012-02-15 MED ORDER — ZIPRASIDONE HCL 60 MG PO CAPS: 60.0000 mg | ORAL_CAPSULE | Freq: Two times a day (BID) | ORAL | Status: DC
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
## 2012-02-15 NOTE — Discharge Summary (Addendum)
Physician Discharge Summary  [name] [name] ZDG:[id number] DOB: May 29, 1966 DOA: 02/13/2012  PCP: Vivia Ewing, MD  Admit date: 02/13/2012 Discharge date: 02/16/2012.  Recommendations for Outpatient Follow-up:  [facility] for inpatient treatment.  Discharge Diagnoses:    1. Acute renal failure secondary to rhabdomyolysis, improved. Creatinine 1.21. 2. Rhabdomyolysis, improved. CPK 5275 am  down from almost 25,000. Does not need further followup as the trend is clearly down. 3. Bipolar affective disorder with psychosis.  Discharge Condition: Stable.  Diet recommendation: Regular.  History of present illness:  This 46 year old man was brought in by the police after being found wondering in the woods. He has a history of bipolar disorder with psychotic features. He was positive for cocaine, marijuana, opioids and benzodiazepines in urine. He was found to be in acute renal failure and have rhabdomyolysis clinically and biochemically.  Hospital Course:  He was treated aggressively with intravenous fluids and analgesia as required. He remained hemodynamically stable throughout his hospital stay. He was evaluated by psychiatry by telemetry conference and medications have been adjusted. Psychiatrist has recommended inpatient psychiatric treatment. He is now medically stable for discharge.  Procedures:  None.  Consultations:  Psychiatry by a telemetry conference.  Discharge Exam: Filed Vitals:   02/15/12 0425  BP: 123/79  Pulse: 75  Temp: 98.5 F (36.9 C)  Resp: 20   Filed Vitals:   02/14/12 1659 02/14/12 2055 02/15/12 0425 02/15/12 0500  BP: 119/80 115/81 123/79   Pulse: 86 83 75   Temp: 98.1 F (36.7 C) 98.4 F (36.9 C) 98.5 F (36.9 C)   TempSrc:   Oral   Resp: 20 20 20    Height:      Weight:   79.652 kg (175 lb 9.6 oz) 79.6 kg (175 lb 7.8 oz)  SpO2: 97% 95% 97%    General: He looks systemically well. He does not look clinically  dehydrated. Cardiovascular: Heart sounds present and normal without murmurs. No evidence of heart failure. Respiratory: Lung fields clear. He is alert and orientated without any focal neurological signs.  Discharge Instructions  Discharge Orders    Future Orders Please Complete By Expires   Diet - low sodium heart healthy      Increase activity slowly        Medication List  As of 02/15/2012  9:39 AM   STOP taking these medications         ibuprofen 200 MG tablet         TAKE these medications         amitriptyline 25 MG tablet   Commonly known as: ELAVIL   Take 25 mg by mouth at bedtime.      clonazePAM 2 MG tablet   Commonly known as: KLONOPIN   Take 2 mg by mouth 2 (two) times daily.      clonazePAM 0.5 MG tablet   Commonly known as: KLONOPIN   Take 1 tablet (0.5 mg total) by mouth 2 (two) times daily as needed for anxiety.      fentaNYL 50 MCG/HR   Commonly known as: DURAGESIC - dosed mcg/hr   Place 1 patch onto the skin every 3 (three) days.      HYDROcodone-acetaminophen 10-325 MG per tablet   Commonly known as: NORCO   Take 2 tablets by mouth every 8 (eight) hours as needed. pain      nicotine 21 mg/24hr patch   Commonly known as: NICODERM CQ - dosed in mg/24 hours   Place  1 patch onto the skin daily as needed (nicotine withdrawal).      ziprasidone 60 MG capsule   Commonly known as: GEODON   Take 1 capsule (60 mg total) by mouth 2 (two) times daily with a meal.              The results of significant diagnostics from this hospitalization (including imaging, microbiology, ancillary and laboratory) are listed below for reference.    Significant Diagnostic Studies: Dg Chest Port 1 View  02/14/2012  *RADIOLOGY REPORT*  Clinical Data: Rhabdomyolysis.  PORTABLE CHEST - 1 VIEW  Comparison: 01/29/2011.  Findings: The heart remains normal in size and the lungs remain clear.  A neural stimulator continues to overlie the lower thoracic spine.  Old, healed left  clavicle fracture.  IMPRESSION: No acute abnormality.  Original Report Authenticated By: Darrol Angel, M.D.    Microbiology: Recent Results (from the past 240 hour(s))  URINE CULTURE     Status: Normal   Collection Time   02/13/12  4:20 PM      Component Value Range Status Comment   Specimen Description URINE, CLEAN CATCH   Final    Special Requests NONE   Final    Culture  Setup Time 02/13/2012 22:45   Final    Colony Count NO GROWTH   Final    Culture NO GROWTH   Final    Report Status 02/15/2012 FINAL   Final      Labs: Basic Metabolic Panel:  Lab 02/15/12 1610 02/14/12 1138 02/14/12 0507 02/13/12 1533  NA 144 140 142 131*  K 3.8 3.8 3.5 3.5  CL 115* 108 107 93*  CO2 22 22 21 23   GLUCOSE 107* 92 78 77  BUN 5* 9 12 13   CREATININE 1.36* 1.56* 1.72* 1.32  CALCIUM 8.4 8.5 8.5 9.5  MG -- -- -- --  PHOS -- -- -- --   creatinine on 02/16/2012 is 1.21. CPK is down to 5275. Liver Function Tests:  Lab 02/14/12 1138 02/14/12 0507 02/13/12 1533  AST 132* 141* 43*  ALT 23 20 12   ALKPHOS 68 65 74  BILITOT 0.4 0.4 0.5  PROT 6.2 6.2 7.3  ALBUMIN 3.6 3.7 4.6     CBC:  Lab 02/14/12 0507 02/13/12 1533  WBC 12.4* 26.5*  NEUTROABS -- 23.6*  HGB 16.2 17.1*  HCT 46.0 46.5  MCV 86.5 84.4  PLT 178 234   Cardiac Enzymes:  Lab 02/15/12 0536 02/14/12 1138 02/14/12 0507 02/13/12 1800 02/13/12 1726  CKTOTAL 9982* 96045* 40981* -- 7877*  CKMB -- -- -- -- --  CKMBINDEX -- -- -- -- --  TROPONINI -- -- -- <0.30 --      Time coordinating discharge: Less than 30 minutes.  SignedWilson Singer  [facility] 02/15/2012, 9:39 AM

## 2012-02-15 NOTE — BHH Counselor (Signed)
Patient was accepted to [facility] 02/14/12, but had to be held for medical reasons. Physician has medically cleared him today but [facility] declined due to continued need for hydration,  And elevated CK.[name] [name] Patient is still in need of inpatient care for his psychosis and has been referred to [facility] and [facility].

## 2012-02-16 DIAGNOSIS — F29 Unspecified psychosis not due to a substance or known physiological condition: Secondary | ICD-10-CM

## 2012-02-16 LAB — BASIC METABOLIC PANEL
BUN: 4 mg/dL — ABNORMAL LOW (ref 6–23)
CO2: 22 mEq/L (ref 19–32)
Calcium: 9.1 mg/dL (ref 8.4–10.5)
Creatinine, Ser: 1.21 mg/dL (ref 0.50–1.35)
Glucose, Bld: 94 mg/dL (ref 70–99)

## 2012-02-16 MED ORDER — CLONAZEPAM 0.5 MG PO TABS
2.0000 mg | ORAL_TABLET | Freq: Two times a day (BID) | ORAL | Status: DC | PRN
  Administered 2012-02-16 – 2012-02-17 (×2): 2 mg via ORAL
  Filled 2012-02-16 (×2): qty 4

## 2012-02-17 ENCOUNTER — Inpatient Hospital Stay (HOSPITAL_COMMUNITY)
Admission: AD | Admit: 2012-02-17 | Discharge: 2012-02-24 | DRG: 897 | Disposition: A | Discharge status: Home / Self Care | Payer: Medicare PPO | Source: Ambulatory Visit | Attending: Psychiatry | Admitting: Psychiatry

## 2012-02-17 ENCOUNTER — Encounter (HOSPITAL_COMMUNITY): Payer: Self-pay | Admitting: Behavioral Health

## 2012-02-17 DIAGNOSIS — F1920 Other psychoactive substance dependence, uncomplicated: Secondary | ICD-10-CM | POA: Diagnosis present

## 2012-02-17 DIAGNOSIS — N179 Acute kidney failure, unspecified: Secondary | ICD-10-CM | POA: Diagnosis present

## 2012-02-17 DIAGNOSIS — F411 Generalized anxiety disorder: Secondary | ICD-10-CM | POA: Diagnosis present

## 2012-02-17 DIAGNOSIS — Z720 Tobacco use: Secondary | ICD-10-CM

## 2012-02-17 DIAGNOSIS — G8929 Other chronic pain: Secondary | ICD-10-CM | POA: Diagnosis present

## 2012-02-17 DIAGNOSIS — M545 Low back pain, unspecified: Secondary | ICD-10-CM | POA: Diagnosis present

## 2012-02-17 DIAGNOSIS — K219 Gastro-esophageal reflux disease without esophagitis: Secondary | ICD-10-CM | POA: Diagnosis present

## 2012-02-17 DIAGNOSIS — F17200 Nicotine dependence, unspecified, uncomplicated: Secondary | ICD-10-CM | POA: Diagnosis present

## 2012-02-17 DIAGNOSIS — F1999 Other psychoactive substance use, unspecified with unspecified psychoactive substance-induced disorder: Principal | ICD-10-CM | POA: Diagnosis present

## 2012-02-17 DIAGNOSIS — Z79899 Other long term (current) drug therapy: Secondary | ICD-10-CM

## 2012-02-17 DIAGNOSIS — F3130 Bipolar disorder, current episode depressed, mild or moderate severity, unspecified: Secondary | ICD-10-CM | POA: Diagnosis present

## 2012-02-17 DIAGNOSIS — F319 Bipolar disorder, unspecified: Secondary | ICD-10-CM | POA: Diagnosis present

## 2012-02-17 DIAGNOSIS — F609 Personality disorder, unspecified: Secondary | ICD-10-CM | POA: Diagnosis present

## 2012-02-17 DIAGNOSIS — G031 Chronic meningitis: Secondary | ICD-10-CM | POA: Diagnosis present

## 2012-02-17 HISTORY — DX: Myoneural disorder, unspecified: G70.9

## 2012-02-17 LAB — BASIC METABOLIC PANEL
CO2: 25 mEq/L (ref 19–32)
Calcium: 8.9 mg/dL (ref 8.4–10.5)
GFR calc non Af Amer: 80 mL/min — ABNORMAL LOW (ref >90)
Sodium: 139 mEq/L (ref 135–145)

## 2012-02-17 LAB — CK: Total CK: 3908 U/L — ABNORMAL HIGH (ref 7–232)

## 2012-02-17 MED ORDER — ALUM & MAG HYDROXIDE-SIMETH 200-200-20 MG/5ML PO SUSP
30.0000 mL | ORAL | Status: DC | PRN
Start: 2012-02-17 — End: 2012-02-17
  Administered 2012-02-17: 30 mL via ORAL
  Filled 2012-02-17: qty 30

## 2012-02-17 NOTE — Progress Notes (Signed)
Pt accepted at [facility] by Dr Laban Emperor to Dr Harvie Heck Readling -room 403-1.  See Support paperwork. Dr Kerry Hough agrees with disposition

## 2012-02-17 NOTE — Progress Notes (Signed)
Report called to Gretta Arab RN at [facility], police called to transport pt. To [facility].

## 2012-02-17 NOTE — Progress Notes (Signed)
Patient was getting agitated and anxious. He kept walking back and forth to the bathroom. Ativan had been given almost an hour and a half earlier, but it only lasted for a little while. Doctor was notified and put in new orders for anti anxiety medication. Will continue to monitor patient.

## 2012-02-17 NOTE — Progress Notes (Signed)
[facility] PROGRESS NOTE  [name] [name]:[id number] DOB: [birth year] DOA: 02/13/2012 PCP: Vivia Ewing, MD  Assessment/Plan: Principal Problem:  *Rhabdomyolysis Active Problems:  BIPOLAR AFFECTIVE DISORDER  GERD  LOW BACK PAIN, CHRONIC  Dehydration  Hyponatremia  Tobacco abuse   Code Status: Full code Family Communication: Discussed with patient Disposition Plan: Per psychiatry service   Brief narrative: This gentleman was admitted from the emergency room after he was found wandering in the woods. He was found to be in acute renal failure and rhabdomyolysis. History aggressively with IV fluids. His creatine kinase has since trended down significantly. It appears that his rhabdomyolysis has improved. Patient has history of bipolar disorder with psychotic features. Tele psychiatry consult was obtained and recommendations were for inpatient psychiatric treatment.  Consultants:  TelePsychiatry  Procedures:  None  Antibiotics:  None  HPI/Subjective: Patient does not have any significant complaints. He denies any pain, shortness of breath, nausea or vomiting. His by mouth intake has been normal. He does not have any recollection of the events that lead to his admission.  Objective: Filed Vitals:   02/16/12 0455 02/16/12 2151 02/17/12 0209 02/17/12 0646  BP: 117/80 130/79 110/69 126/80  Pulse: 78 67 72 66  Temp: 98.5 F (36.9 C) 98.8 F (37.1 C) 98.5 F (36.9 C) 97.8 F (36.6 C)  TempSrc: Oral Oral Oral Oral  Resp: 20 20 20 18   Height:      Weight: 80.695 kg (177 lb 14.4 oz)   81.466 kg (179 lb 9.6 oz)  SpO2: 94%  94% 92%    Intake/Output Summary (Last 24 hours) at 02/17/12 1446 Last data filed at 02/17/12 0646  Gross per 24 hour  Intake 2956.66 ml  Output      0 ml  Net 2956.66 ml    Exam:   General:  No acute distress  Cardiovascular: S1, S2, regular rate and rhythm  Respiratory: Clear to auscultation bilaterally  Abdomen: Soft,  nontender, nondistended, bowel sounds are active  Data Reviewed: Basic Metabolic Panel:  Lab 02/17/12 8119 02/16/12 0456 02/15/12 0536 02/14/12 1138 02/14/12 0507  NA 139 145 144 140 142  K 3.6 3.8 3.8 3.8 3.5  CL 107 112 115* 108 107  CO2 25 22 22 22 21   GLUCOSE 93 94 107* 92 78  BUN 8 4* 5* 9 12  CREATININE 1.09 1.21 1.36* 1.56* 1.72*  CALCIUM 8.9 9.1 8.4 8.5 8.5  MG -- -- -- -- --  PHOS -- -- -- -- --   Liver Function Tests:  Lab 02/14/12 1138 02/14/12 0507 02/13/12 1533  AST 132* 141* 43*  ALT 23 20 12   ALKPHOS 68 65 74  BILITOT 0.4 0.4 0.5  PROT 6.2 6.2 7.3  ALBUMIN 3.6 3.7 4.6   No results found for this basename: LIPASE:5,AMYLASE:5 in the last 168 hours No results found for this basename: AMMONIA:5 in the last 168 hours CBC:  Lab 02/14/12 0507 02/13/12 1533  WBC 12.4* 26.5*  NEUTROABS -- 23.6*  HGB 16.2 17.1*  HCT 46.0 46.5  MCV 86.5 84.4  PLT 178 234   Cardiac Enzymes:  Lab 02/17/12 0603 02/16/12 0456 02/15/12 0536 02/14/12 1138 02/14/12 0507 02/13/12 1800  CKTOTAL 3908* 5275* 9982* 14782* 95621* --  CKMB -- -- -- -- -- --  CKMBINDEX -- -- -- -- -- --  TROPONINI -- -- -- -- -- <0.30   BNP (last 3 results) No results found for this basename: PROBNP:3 in the last 8760 hours CBG: No results  found for this basename: GLUCAP:5 in the last 168 hours  Recent Results (from the past 240 hour(s))  URINE CULTURE     Status: Normal   Collection Time   02/13/12  4:20 PM      Component Value Range Status Comment   Specimen Description URINE, CLEAN CATCH   Final    Special Requests NONE   Final    Culture  Setup Time 02/13/2012 22:45   Final    Colony Count NO GROWTH   Final    Culture NO GROWTH   Final    Report Status 02/15/2012 FINAL   Final      Studies: Dg Chest Port 1 View  02/14/2012  *RADIOLOGY REPORT*  Clinical Data: Rhabdomyolysis.  PORTABLE CHEST - 1 VIEW  Comparison: 01/29/2011.  Findings: The heart remains normal in size and the lungs remain  clear.  A neural stimulator continues to overlie the lower thoracic spine.  Old, healed left clavicle fracture.  IMPRESSION: No acute abnormality.  Original Report Authenticated By: Darrol Angel, M.D.    Scheduled Meds:   . docusate sodium  100 mg Oral BID  . enoxaparin (LOVENOX) injection  40 mg Subcutaneous Q24H  . fentaNYL  50 mcg Transdermal Q72H  . ziprasidone  60 mg Oral BID WC   Continuous Infusions:   . 0.9 % NaCl with KCl 20 mEq / L 100 mL/hr at 02/17/12 1055    Principal Problem:  *Rhabdomyolysis Active Problems:  BIPOLAR AFFECTIVE DISORDER  GERD  LOW BACK PAIN, CHRONIC  Dehydration  Hyponatremia  Tobacco abuse   Plan:  Patient was initially admitted with rhabdomyolysis, acute renal failure, dehydration. He was aggressively treated with IV fluids and all these problems have significantly improved. From a Medical standpoint, he is stable for discharge. He was evaluated by tele psychiatry who recommended inpatient psychiatric admission. His medications were also further adjusted. We will give her further disposition of the patient to the psychiatry service. Please refer to the discharge summary dictated by Dr. Karilyn Cota on 7/16 for further details of the hospital course. There have been no changes in his discharge medications or significant events in his hospital course since that discharge summary.  Time spent: 20 minutes    [facility]  [facility] Pager [phone]. If 8PM-8AM, please contact night-coverage at [email or web address], password [facility] 02/17/2012, 2:46 PM  LOS: 4 days
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
## 2012-02-18 DIAGNOSIS — F1999 Other psychoactive substance use, unspecified with unspecified psychoactive substance-induced disorder: Principal | ICD-10-CM

## 2012-02-18 MED ORDER — LORAZEPAM 1 MG PO TABS
1.0000 mg | ORAL_TABLET | Freq: Three times a day (TID) | ORAL | Status: DC | PRN
  Administered 2012-02-18 – 2012-02-22 (×9): 1 mg via ORAL
  Filled 2012-02-18 (×9): qty 1

## 2012-02-18 MED ORDER — ACETAMINOPHEN 325 MG PO TABS
650.0000 mg | ORAL_TABLET | Freq: Four times a day (QID) | ORAL | Status: DC | PRN
  Administered 2012-02-18: 650 mg via ORAL

## 2012-02-18 MED ORDER — RISPERIDONE 1 MG PO TABS
1.0000 mg | ORAL_TABLET | Freq: Every day | ORAL | Status: DC
  Administered 2012-02-18 – 2012-02-20 (×4): 1 mg via ORAL
  Filled 2012-02-18 (×6): qty 1

## 2012-02-18 MED ORDER — IBUPROFEN 200 MG PO TABS
400.0000 mg | ORAL_TABLET | Freq: Four times a day (QID) | ORAL | Status: DC | PRN
  Administered 2012-02-19 – 2012-02-21 (×7): 400 mg via ORAL
  Filled 2012-02-18: qty 2
  Filled 2012-02-18 (×2): qty 1
  Filled 2012-02-18: qty 2
  Filled 2012-02-18 (×2): qty 1
  Filled 2012-02-18: qty 2
  Filled 2012-02-18: qty 1

## 2012-02-18 MED ORDER — ALUM & MAG HYDROXIDE-SIMETH 200-200-20 MG/5ML PO SUSP: 30.0000 mL | ORAL | Status: DC | PRN

## 2012-02-18 MED ORDER — CIPROFLOXACIN HCL 500 MG PO TABS
500.0000 mg | ORAL_TABLET | Freq: Two times a day (BID) | ORAL | Status: AC
  Administered 2012-02-19 – 2012-02-21 (×6): 500 mg via ORAL
  Filled 2012-02-18 (×7): qty 1

## 2012-02-18 MED ORDER — MAGNESIUM HYDROXIDE 400 MG/5ML PO SUSP: 30.0000 mL | Freq: Every day | ORAL | Status: DC | PRN

## 2012-02-18 MED ORDER — NICOTINE 21 MG/24HR TD PT24
21.0000 mg | MEDICATED_PATCH | Freq: Every day | TRANSDERMAL | Status: DC
  Administered 2012-02-18 – 2012-02-24 (×6): 21 mg via TRANSDERMAL
  Filled 2012-02-18 (×13): qty 1

## 2012-02-18 NOTE — BHH Suicide Risk Assessment (Signed)
Suicide Risk Assessment  Admission Assessment     Demographic factors:  Assessment Details Time of Assessment: Admission Information Obtained From: Patient Current Mental Status:  Current Mental Status: Suicidal ideation indicated by others Loss Factors:  Loss Factors: Financial problems / change in socioeconomic status Historical Factors:    patient has a history of multiple previous psychiatric hospitalizations Risk Reduction Factors:    patient has a place to live and has family  CLINICAL FACTORS:   Severe Anxiety and/or Agitation Bipolar Disorder:   Mixed State Chronic Pain More than one psychiatric diagnosis Unstable or Poor Therapeutic Relationship Previous Psychiatric Diagnoses and Treatments Medical Diagnoses and Treatments/Surgeries Polysubstance abuse  COGNITIVE FEATURES THAT CONTRIBUTE TO RISK:  Closed-mindedness  , patient lacks insight into his illness, substance abuse issues  SUICIDE RISK:   Moderate:  Frequent suicidal ideation with limited intensity, and duration, some specificity in terms of plans, no associated intent, good self-control, limited dysphoria/symptomatology, some risk factors present, and identifiable protective factors, including available and accessible social support.  PLAN OF CARE: Will transfer patient to the 300 hall as he has a long history of polysubstance abuse/dependency Will increase the risperidone to help with mood stabilization and impulse control Will order a medical consult for pain management Patient to attend groups    Ashaunti Treptow 02/18/2012, 1:42 PM
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
## 2012-02-18 NOTE — Progress Notes (Signed)
02/18/2012         Time: 0930       Group Topic/Focus: The focus of the group is on enhancing the patients' ability to cope with stressors by understanding what coping is, why it is important, the negative effects of stress and developing healthier coping skills. Patients practice [place] and discuss how exercise can be used as a healthy coping strategy.  Participation Level: Did not attend  Participation Quality: Not Applicable  Affect: Not Applicable  Cognitive: Not Applicable   Additional Comments: None   Bernell Sigal 02/18/2012 12:03 PM

## 2012-02-18 NOTE — Progress Notes (Signed)
BHH Group Notes:  (Counselor/Nursing/MHT/Case Management/Adjunct)  02/18/2012  2:30  PM  Type of Therapy: Group Therapy   Participation Level: Minimal   Participation Quality: Limited  Affect:  Appropriate  Cognitive: oriented, alert   Insight: Poor  Engagement in Group: Limited  Modes of Intervention: Clarification, Education, Problem-solving, Socialization, Activity, Encouragement and Support   Summary of Progress/Problems: Pt participated in group by listening attentively and self disclosing.  After a brief check-in, therapist introduced the topic of Feelings Around Relapse.  Therapist guided patients to explore emotions they have related to recovery.  Therapist prompted patients to answer the following questions: Which emotions come before relapse?  Which emotions result after the relapse:  Which emotions are related to their recovery; and Ways to respond to other people's emotional reaction to their relapse and recovery.  Patient was minimally engaged in the process and reported Depression preceded relapse, confusion and anxiety resulted and he needed to deal with depression in order to maintain recovery.  Pt participated in the Positive Affirmation Activity, by giving and receiving positive affirmations.  Therapist offered support and encouragement.  Some progress noted.  Intervention effective.         Marni Griffon C 02/18/2012  2:30 PM

## 2012-02-18 NOTE — Progress Notes (Signed)
D: Pt denies SI/HI and AVH; pt has an anxious mood and affect; pt states he "doesn't need to be here"; rates his depression and hopelessness a 1 out of 10 (1 low/10 high); pt also reports that his pain level is a 9 out of 10 (1 low/10 high) A: Pt given emotional support from RN; pt encouraged to come to staff with concerns and/or questions; pt's medication routine continued; pt's orders and plan of care reviewed; MD notified of pt's pain level and concerns R: Pt remains appropriate and cooperative; will continue to monitor

## 2012-02-18 NOTE — Progress Notes (Signed)
UR Chart Review Completed  

## 2012-02-18 NOTE — Discharge Planning (Addendum)
Patient asked that a letter be sent to court re not being able to appear on Monday, and this was done.  Follow up appointment was made at [facility] in [place] for 02/23/12 at 7:45AM, ref #[id number].  Releases were signed and Case Manager has requested most recent records from [facility] and [facility].  [place], LCSW 02/18/2012, 12:26 PM  Records received from [facility], in shadow chart.  Patient has long history of psychiatric and substance abuse problems.  Ambrose Mantle, LCSW 02/18/2012, 1:38 PM

## 2012-02-18 NOTE — Progress Notes (Signed)
Psychoeducational Group Note  Date:  02/18/2012 Time:  1100  Group Topic/Focus:  Relapse Prevention Planning:   The focus of this group is to define relapse and discuss the need for planning to combat relapse.  Participation Level:  Active  Participation Quality:  Appropriate and Sharing  Affect:  Appropriate  Cognitive:  Appropriate  Insight:  Good  Engagement in Group:  Good  Additional Comments:  none  [name] [name] M 02/18/2012, 1:40 PM

## 2012-02-18 NOTE — Discharge Planning (Signed)
Met with patient in Aftercare Planning Group and provided today's workbook based on theme of the day.  He was alert and had good eye contact throughout group, even joined in some of the laughter in the conversations.  Patient reports he owns his own home and lives alone.  He will get someone in his family to pick him up from the hospital when he is discharged.    He just started going to [facility] approximately 1 month ago, and will return there for medication management.  He already has an appointment at the end of August with the doctor.  Patient reported he has a court date on Monday 02/21/12 and is concerned that if he is in the hospital, a letter is sent to request a continuance.  Case Manager checked public record, and found this report to be accurate and will send a letter if it is determined that he will remain through the weekend.  Ambrose Mantle, LCSW 02/18/2012, 9:41 AM

## 2012-02-18 NOTE — Tx Team (Signed)
Interdisciplinary Treatment Plan Update (Adult)  Date:  02/18/2012  Time Reviewed:  10:15AM-11:15AM  Progress in Treatment: Attending groups:  Yes, even though new Participating in groups:    Yes Taking medication as prescribed:    Yes Tolerating medication:   Yes Family/Significant other contact made:  No, but patient agrees we can contact his son Patient understands diagnosis:   No Discussing patient identified problems/goals with staff:   Yes, although with some reluctance Medical problems stabilized or resolved:   Stable Denies suicidal/homicidal ideation:  Yes Issues/concerns per patient self-inventory:   None Other:    New problem(s) identified: No, Describe:    Reason for Continuation of Hospitalization: Delusions  Medication stabilization  Interventions implemented related to continuation of hospitalization:  Medication monitoring and adjustment, safety checks Q15 min., suicide risk assessment, group therapy, psychoeducation, collateral contact, aftercare planning, ongoing physician assessments, medication education  Additional comments:  Not applicable  Estimated length of stay:  2-4 days  Discharge Plan:  Return to his home alone, follow up with [facility].  Is under Outpatient Commitment from [facility].  New goal(s):  Not applicable  Review of initial/current patient goals per problem list:   1.  Goal(s):  Determine if & how to address cocaine use at discharge.  Met:  No  Target date:  By Discharge   As evidenced by:  Case Manager has referred for substance abuse counseling at discharge.  Will talk to patient about cooperating  2.  Goal(s):  Assess for symptoms and need to remain in the hospital.  Met:  Yes  Target date:  By Discharge   As evidenced by:  Patient has been assessed by psychiatrist, and will remain under IVC  3.  Goal(s):  Reduce anxiety to no more than 3 at discharge.  Met:  No  Target date:  By Discharge   As evidenced by:  Patient is anxious  about being in the hospital.  4.  Goal(s):  Medication stabilization  Met:  No  Target date:  By Discharge   As evidenced by:  Patient has had 10 hospitalization in his lifetime, was at [facility] for approx. 15 days 4 months ago and left there under Outpatient Commitment, just started going to [facility] 1 month ago, and yet is on no medications  Attendees: Patient:  [name] [name]  02/18/2012 10:15AM-11:15AM  Family:     Physician:  Dr. Lucianne Muss 02/18/2012 10:15AM-11:15AM  Nursing:   Omelia Blackwater, RN 02/18/2012 10:15AM -11:15AM   Case Manager:  Ambrose Mantle, LCSW 02/18/2012 10:15AM-11:15AM  Counselor:  Marni Griffon, LCAS 02/18/2012 10:15AM-11:15AM  Other:   Nestor Ramp, RN 02/18/2012   Other:      Other:      Other:       Scribe for Treatment Team:   Sarina Ser, 02/18/2012, 10:15AM-11:15AM
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
## 2012-02-18 NOTE — Tx Team (Signed)
Initial Interdisciplinary Treatment Plan  PATIENT STRENGTHS: (choose at least two) Capable of independent living Financial means Special hobby/interest Supportive family/friends  PATIENT STRESSORS: Health problems Marital or family conflict Medication change or noncompliance Substance abuse   PROBLEM LIST: Problem List/Patient Goals Date to be addressed Date deferred Reason deferred Estimated date of resolution  Psychosis      Substance abuse                                                 DISCHARGE CRITERIA:  Ability to meet basic life and health needs Improved stabilization in mood, thinking, and/or behavior Motivation to continue treatment in a less acute level of care Reduction of life-threatening or endangering symptoms to within safe limits Verbal commitment to aftercare and medication compliance  PRELIMINARY DISCHARGE PLAN: Attend aftercare/continuing care group Outpatient therapy Participate in family therapy Placement in alternative living arrangements Return to previous living arrangement  PATIENT/FAMIILY INVOLVEMENT: This treatment plan has been presented to and reviewed with the patient, [name] [name].  The patient and family have been given the opportunity to ask questions and make suggestions.  [name] 02/18/2012, 12:28 AM

## 2012-02-19 MED ORDER — AMITRIPTYLINE HCL 25 MG PO TABS
25.0000 mg | ORAL_TABLET | Freq: Every day | ORAL | Status: DC
  Administered 2012-02-19 – 2012-02-23 (×6): 25 mg via ORAL
  Filled 2012-02-19 (×9): qty 1

## 2012-02-19 MED ORDER — HYDROXYZINE HCL 50 MG PO TABS
50.0000 mg | ORAL_TABLET | Freq: Three times a day (TID) | ORAL | Status: DC | PRN
  Administered 2012-02-19 – 2012-02-22 (×3): 50 mg via ORAL
  Filled 2012-02-19 (×2): qty 1
  Filled 2012-02-19: qty 6
  Filled 2012-02-19: qty 1

## 2012-02-19 NOTE — Progress Notes (Signed)
Psychoeducational Group Note  Date:  02/19/2012 Time:  1515  Group Topic/Focus:  Healthy Communication:   The focus of this group is to discuss communication, barriers to communication, as well as healthy ways to communicate with others.  Participation Level:  Did Not Attend  Participation Quality:    Affect:    Cognitive:    Insight:    Engagement in Group:    Additional Comments:  none  Marquis Lunch, Summer Parthasarathy 02/19/2012, 6:55 PM

## 2012-02-19 NOTE — Progress Notes (Signed)
Patient ID: MUSAB WINGARD, male   DOB: 23-Jan-1966, 46 y.o.   MRN: [id number]   [facility] Group Notes:  (Counselor/Nursing/MHT/Case Management/Adjunct)  02/19/2012 11 AM  Type of Therapy:  Aftercare Planning, Group Therapy, Dance/Movement Therapy   Participation Level:  Did Not Attend  Cassidi Long 02/19/2012. 11:46 AM

## 2012-02-19 NOTE — Progress Notes (Signed)
Pt was irritable and requesting medications for his nerves and pain at around 1815   He usually takes narcotic pain meds and fentnyl patch  Vitals 109/73 p 105   Informed pa on of same and she ordered vistaril same was given to pt and he expressed appreciation but feels like it isnt fair for him not to get the pain medication  It was explained to him that because he was abusing other substances that the doctors would detox him off same   Pt verbalized understanding and is safe

## 2012-02-19 NOTE — Progress Notes (Signed)
D) PATIENT WALK OUT OF GROUP. STATED THAT HE WAS HURTING BAD AND WANTED A NEW FENTANYL PATCH BECAUSE THE ONE HE HAD ON HAD BEEN ON HIM FOR 6 DAYS. PATIENT TOOK THE FENTANYL PATCH OFF AND THREW IT INTO THE TRASH. EXPLAIN TO PATIENT THAT THE MD DID NOT ORDER HIM HIS FENTANYL PATCH. A) PATIENT GIVEN IBRUPROFEN FOR PAIN. GAVE PT ATIVAN FOR AGITATION. SUPPORT AND ENCOURAGEMENT GIVEN. Q15 MINUTE SAFETY CHECKS. R) PATIENT REMAINED SAFE ON UNIT. RESTING IN BED AT PRESENT TIME.

## 2012-02-19 NOTE — Progress Notes (Signed)
[facility] Group Notes:  (Counselor/Nursing/MHT/Case Management/Adjunct)  02/19/2012 10:14 PM  Type of Therapy:  Psychoeducational Skills  Participation Level:  Active  Participation Quality:  Appropriate  Affect:  Anxious and Appropriate  Cognitive:  Appropriate  Insight:  Limited  Engagement in Group:  Limited  Engagement in Therapy:  Limited  Modes of Intervention:  Support  Summary of Progress/Problems:Pt attended the first few minutes of the evening Wrap-Up Group, but was unable to remain for the full session. Pt introduced himself to the Group and then politely excused himself and apologized for having to leave due to back pain.   Christ Kick 02/19/2012, 10:14 PM

## 2012-02-19 NOTE — Progress Notes (Signed)
D   Pt was irritable when i first entered his room this morning   He did not want to wake up and take his medications but after explanation he finally did take the medicine    He reported poor sleep last night and demanded the small bedside light be turned off   He is guarded and paranoid and appears fearful A   Verbal support given  Medications administered and effectiveness monitored  Discussed need for pt to take the antibiotic on time to prevent infection from coming back   Q 15 min checks R   Pt did verbalize understanding and took medication  He drank all of the water with the medication as instructed   He is safe at present time

## 2012-02-19 NOTE — Progress Notes (Signed)
Psychoeducational Group Note  Date:  02/19/2012 Time: 2000  Group Topic/Focus:  Wrap-Up Group:   The focus of this group is to help patients review their daily goal of treatment and discuss progress on daily workbooks.  Participation Level:  Active  Participation Quality:  Appropriate  Affect:  Appropriate  Cognitive:  Appropriate  Insight:  Limited  Engagement in Group:  Limited  Additional Comments:  Pt attended wrap- up group but didn't share much.   Helvi Royals A 02/19/2012, 2:14 AM

## 2012-02-19 NOTE — Progress Notes (Signed)
Psychoeducational Group Note  Date:  02/19/2012 Time:  0945 am  Group Topic/Focus:  Identifying Needs:   The focus of this group is to help patients identify their personal needs that have been historically problematic and identify healthy behaviors to address their needs.  Participation Level:  Did Not Attend    Andrena Mews 02/19/2012,10:20 AM

## 2012-02-19 NOTE — Progress Notes (Signed)
Patient ID: [name] [name], male   DOB: Nov 20, 1965, 46 y.o.   MRN: [id number] D. The patient isolated in his room most of the evening. Made jokes with staff about bringing him Ativan. Laughs inappropriately. Denied any auditory hallucinations. A. Q 15 minute checks maintained for safety. Encouraged to attend evening wrap-up group. Assessed for suicide risk and fall risk. Medications reviewed with patient and administered. R. Attended evening group, but arrived late and did not participate. Compliant with medication.
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
## 2012-02-20 NOTE — Progress Notes (Signed)
Psychoeducational Group Note  Date:  02/20/2012 Time:  0945 am  Group Topic/Focus:  Making Healthy Choices:   The focus of this group is to help patients identify negative/unhealthy choices they were using prior to admission and identify positive/healthier coping strategies to replace them upon discharge.  Participation Level:  Did Not Attend  Andrena Mews 02/20/2012, 10:29 AM

## 2012-02-20 NOTE — Progress Notes (Signed)
D   Pt has isolated to his room and has not attended groups  He has requested medication several times today for his nerves and was given according to orders   Pt does not interact with others  He appears depressed and irritable A   Verbal support given  Medications administered and effectiveness monitored   Encourage pt to be more visible on the milue and attend groups   Q 15 min checks R   Pt safe at present  And said he would not be going to any groups

## 2012-02-20 NOTE — Progress Notes (Signed)
Patient ID: [name] [name], male   DOB: 09-21-1965, 46 y.o.   MRN: [id number]   [facility] Group Notes:  (Counselor/Nursing/MHT/Case Management/Adjunct)  02/20/2012 11 AM  Type of Therapy:  Aftercare Planning, Group Therapy, Dance/Movement Therapy   Participation Level:  Did Not Attend   Cassidi Long 02/20/2012. 11:51 AM

## 2012-02-21 DIAGNOSIS — F319 Bipolar disorder, unspecified: Secondary | ICD-10-CM

## 2012-02-21 DIAGNOSIS — M545 Low back pain: Secondary | ICD-10-CM

## 2012-02-21 MED ORDER — FENTANYL 50 MCG/HR TD PT72
50.0000 ug | MEDICATED_PATCH | TRANSDERMAL | Status: DC
  Administered 2012-02-21: 50 ug via TRANSDERMAL
  Filled 2012-02-21: qty 1

## 2012-02-21 MED ORDER — RISPERIDONE 2 MG PO TABS
2.0000 mg | ORAL_TABLET | Freq: Every day | ORAL | Status: DC
  Administered 2012-02-21 – 2012-02-23 (×3): 2 mg via ORAL
  Filled 2012-02-21: qty 2
  Filled 2012-02-21 (×4): qty 1
  Filled 2012-02-21: qty 2

## 2012-02-21 MED ORDER — HYDROCODONE-ACETAMINOPHEN 10-325 MG PO TABS
2.0000 | ORAL_TABLET | Freq: Four times a day (QID) | ORAL | Status: DC | PRN
  Administered 2012-02-21 – 2012-02-22 (×2): 2 via ORAL
  Filled 2012-02-21 (×2): qty 2
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
## 2012-02-21 NOTE — Discharge Planning (Signed)
Case Manager did not see patient today, as he did not attend Aftercare Planning Group and was not available later when Case Manager went to his room.  Ambrose Mantle, LCSW 02/21/2012, 4:01 PM
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
## 2012-02-21 NOTE — H&P (Signed)
Agree with the assessment

## 2012-02-21 NOTE — Therapy (Signed)
Psychoeducational Group Note  Date:  02/20/2012 Time:  2005  Group Topic/Focus:  Wrap-Up Group:   The focus of this group is to help patients review their daily goal of treatment and discuss progress on daily workbooks.  Participation Level:  Did Not Attend  Participation Quality:  N/A Did not attend group  Affect:    Cognitive:    Insight:    Engagement in Group:    Additional Comments: Patient was in room and in bed, awake, for the duration of wrap-up group this evening.  Jalee Saine, Newton Pigg 02/21/2012, 2:44 AM
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
## 2012-02-21 NOTE — Progress Notes (Signed)
Psychoeducational Group Note  Date:  02/21/2012 Time:  1100  Group Topic/Focus:  Wellness Toolbox:   The focus of this group is to discuss various aspects of wellness, balancing those aspects and exploring ways to increase the ability to experience wellness.  Patients will create a wellness toolbox for use upon discharge.  Participation Level: Did Not Attend  Participation Quality:  Not Applicable  Affect:  Not Applicable  Cognitive:  Not Applicable  Insight:  Not Applicable  Engagement in Group: Not Applicable  Additional Comments:  Pt refused to attend group this morning, stating that he needs to rest all day and shouldn't be bother by any one.  Treyvon Blahut E 02/21/2012, 5:48 PM

## 2012-02-21 NOTE — Tx Team (Signed)
Interdisciplinary Treatment Plan Update (Adult)  Date:  02/21/2012  Time Reviewed:  10:15AM-11:15AM  Progress in Treatment: Attending groups:  Initially did attend groups, but no groups attended in last couple of days due to agitation Participating in groups:    When attends Taking medication as prescribed:    Yes Tolerating medication:   Yes Family/Significant other contact made:  Patient states we can contact his son Patient understands diagnosis:   Yes, poor insight, poor judgment Discussing patient identified problems/goals with staff:   Yes Medical problems stabilized or resolved:   No, patient continues to have severe back pain Denies suicidal/homicidal ideation:  Yes Issues/concerns per patient self-inventory:   Pain Other:    New problem(s) identified: Yes, Describe:  patient should program on [address] hall, should be located there if possible  Reason for Continuation of Hospitalization: Anxiety Depression Medical Issues Medication stabilization Other; describe poor sleep  Interventions implemented related to continuation of hospitalization:  Medication monitoring and adjustment, safety checks Q15 min., suicide risk assessment, group therapy, psychoeducation, collateral contact, aftercare planning, ongoing physician assessments, medication education  Additional comments:  Not applicable  Estimated length of stay:  2-3 days  Discharge Plan:  Return to live in his home, follow up, possibly with [facility]  New goal(s):  Not applicable  Review of initial/current patient goals per problem list:   1.  Goal(s):  Determine if & how to address cocaine use at discharge.  Met:  No  Target date:  By Discharge   As evidenced by:  Has a follow-up appointment at [facility] in [place], and this was mentioned in referral; however, it needs to be determined if patient should have ACTT services.  2.  Goal(s):  Reduce anxiety to no more than 3 at discharge.  Met:   No  Target date:  By Discharge   As evidenced by:  Appears to have considerable anxiety at this time  3.  Goal(s):  Medication stabilization  Met:  No  Target date:  By Discharge   As evidenced by:  Ongoing  4.  Goal(s):  Return sleep to normal pattern of 6+ hours nightly.  Met:  No  Target date:  By Discharge   As evidenced by:  4.25 hours last night  Attendees: Patient:  Did not attend   Family:     Physician:  Dr. Nelly Rout 02/21/2012 10:15AM-11:15AM  Nursing:   Joslyn Devon, RN 02/21/2012 10:15AM -11:15AM   Case Manager:  Ambrose Mantle, LCSW 02/21/2012 10:15AM-11:15AM  Counselor:  Veto Kemps, MT-BC 02/21/2012 10:15AM-11:15AM  Other:   Neill Loft, RN 02/21/2012   Other:      Other:      Other:       Scribe for Treatment Team:   Sarina Ser, 02/21/2012, 10:15AM-11:15AM

## 2012-02-21 NOTE — Progress Notes (Signed)
[facility] Group Notes:  (Counselor/Nursing/MHT/Case Management/Adjunct)  02/21/2012 3:00 PM  Type of Therapy:  Group Therapy  Participation Level:  Did Not Attend    [name] [name] 02/21/2012, 3:00 PM

## 2012-02-21 NOTE — Progress Notes (Signed)
D.  Pt. Resting in bed in his room requesting pain medication for his back.  Rated pain on pain scale as a 10.  Denies SI/HI and denies A/V hallucinations.  A.  Ibuprofen 400 mg po given.  R.  Pain reassessed.  Pt. Reported pain as a 10.  Support given and Clinical research associate will continue to assess pt.

## 2012-02-21 NOTE — Progress Notes (Signed)
02/21/2012      Time: 0930      Group Topic/Focus: The focus of this group is on discussing various styles of communication and communicating assertively using 'I' (feeling) statements.   Participation Level: Did not attend  Participation Quality: Not Applicable  Affect: Not Applicable  Cognitive: Not Applicable   Additional Comments: Patient seen pacing the hallway, nursing staff reports he is very agitated and not appropriate for group at this time.   Siddhartha Hoback 02/21/2012 11:57 AM

## 2012-02-22 DIAGNOSIS — F3130 Bipolar disorder, current episode depressed, mild or moderate severity, unspecified: Secondary | ICD-10-CM

## 2012-02-22 DIAGNOSIS — F411 Generalized anxiety disorder: Secondary | ICD-10-CM

## 2012-02-22 MED ORDER — DULOXETINE HCL 20 MG PO CPEP
20.0000 mg | ORAL_CAPSULE | Freq: Every day | ORAL | Status: DC
  Administered 2012-02-22 – 2012-02-24 (×3): 20 mg via ORAL
  Filled 2012-02-22 (×4): qty 1
  Filled 2012-02-22 (×2): qty 2
  Filled 2012-02-22: qty 1

## 2012-02-22 MED ORDER — RISPERIDONE 0.25 MG PO TABS
0.2500 mg | ORAL_TABLET | Freq: Two times a day (BID) | ORAL | Status: DC
  Administered 2012-02-22 – 2012-02-24 (×5): 0.25 mg via ORAL
  Filled 2012-02-22: qty 6
  Filled 2012-02-22 (×3): qty 1
  Filled 2012-02-22: qty 6
  Filled 2012-02-22: qty 1
  Filled 2012-02-22 (×2): qty 6
  Filled 2012-02-22 (×3): qty 1

## 2012-02-22 MED ORDER — GABAPENTIN 100 MG PO CAPS
200.0000 mg | ORAL_CAPSULE | Freq: Four times a day (QID) | ORAL | Status: AC
  Administered 2012-02-23 (×3): 200 mg via ORAL
  Filled 2012-02-22 (×4): qty 2

## 2012-02-22 MED ORDER — PANTOPRAZOLE SODIUM 20 MG PO TBEC
20.0000 mg | DELAYED_RELEASE_TABLET | Freq: Two times a day (BID) | ORAL | Status: DC
  Administered 2012-02-22 – 2012-02-24 (×5): 20 mg via ORAL
  Filled 2012-02-22: qty 1
  Filled 2012-02-22 (×2): qty 4
  Filled 2012-02-22 (×6): qty 1
  Filled 2012-02-22 (×2): qty 4
  Filled 2012-02-22: qty 1

## 2012-02-22 MED ORDER — MELOXICAM 7.5 MG PO TABS
7.5000 mg | ORAL_TABLET | Freq: Every day | ORAL | Status: DC
  Administered 2012-02-22 – 2012-02-24 (×3): 7.5 mg via ORAL
  Filled 2012-02-22: qty 2
  Filled 2012-02-22: qty 1
  Filled 2012-02-22: qty 2
  Filled 2012-02-22 (×5): qty 1

## 2012-02-22 MED ORDER — GABAPENTIN 400 MG PO CAPS
400.0000 mg | ORAL_CAPSULE | Freq: Four times a day (QID) | ORAL | Status: DC
  Filled 2012-02-22 (×6): qty 1

## 2012-02-22 MED ORDER — METHOCARBAMOL 500 MG PO TABS
500.0000 mg | ORAL_TABLET | Freq: Three times a day (TID) | ORAL | Status: DC
  Administered 2012-02-22 – 2012-02-24 (×7): 500 mg via ORAL
  Filled 2012-02-22 (×2): qty 1
  Filled 2012-02-22 (×2): qty 6
  Filled 2012-02-22 (×2): qty 1
  Filled 2012-02-22: qty 6
  Filled 2012-02-22 (×5): qty 1
  Filled 2012-02-22: qty 6
  Filled 2012-02-22: qty 1
  Filled 2012-02-22: qty 6

## 2012-02-22 MED ORDER — IBUPROFEN 800 MG PO TABS
800.0000 mg | ORAL_TABLET | Freq: Four times a day (QID) | ORAL | Status: DC | PRN
  Administered 2012-02-22 – 2012-02-24 (×5): 800 mg via ORAL
  Filled 2012-02-22 (×5): qty 1

## 2012-02-22 MED ORDER — GABAPENTIN 300 MG PO CAPS
300.0000 mg | ORAL_CAPSULE | Freq: Four times a day (QID) | ORAL | Status: AC
  Administered 2012-02-23 – 2012-02-24 (×4): 300 mg via ORAL
  Filled 2012-02-22 (×2): qty 1
  Filled 2012-02-22: qty 8
  Filled 2012-02-22 (×2): qty 1

## 2012-02-22 MED ORDER — LIDOCAINE 5 % EX PTCH
1.0000 | MEDICATED_PATCH | CUTANEOUS | Status: DC
  Administered 2012-02-22 – 2012-02-23 (×2): 1 via TRANSDERMAL
  Filled 2012-02-22 (×5): qty 1

## 2012-02-22 MED ORDER — GABAPENTIN 100 MG PO CAPS
100.0000 mg | ORAL_CAPSULE | Freq: Four times a day (QID) | ORAL | Status: AC
  Administered 2012-02-22 (×2): 100 mg via ORAL
  Filled 2012-02-22 (×4): qty 1
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
## 2012-02-22 NOTE — Tx Team (Addendum)
Interdisciplinary Treatment Plan Update (Adult)  Date:  02/22/2012  Time Reviewed:  10:34 AM   Progress in Treatment: Attending groups:   Yes   Participating in groups:  Yes Taking medication as prescribed:  Yes Tolerating medication:  Yes Family/Significant othe contact made: Contact can be made with son Patient understands diagnosis:  Yes Discussing patient identified problems/goals with staff: Yes Medical problems stabilized or resolved: No.  Patient continues to endorse back pain Denies suicidal/homicidal ideation:Yes Issues/concerns per patient self-inventory:  Other:  New problem(s) identified:  Reason for Continuation of Hospitalization: Anxiety Medication stabilizaton  Interventions implemented related to continuation of hospitalization:  Monitor safety q 15 mins, medication stabilization, family collateral contact  Additional comments:  Estimated length of stay: 2-3 days  Discharge Plan: Home with outpatient follow up at [facility]  New goal(s):  Review of initial/current patient goals per problem list:    1.  Goal(s):  Address issues regarding drug use  Met:  Yes  Target date: d/c  As evidenced by: MD discuss possible drug use with patient  -Patient endorses using THC but denies any other drug use  2.  Goal (s): Reduce depression/anxiety (rated at one and four today)   Met:  Yes  Target date: d/c  As evidenced by: Patient currently rating symptoms at four or below    3.  Goal(s): .stabilize on meds   Met: No - MD to make adjustments to medication today  Target date: d/c  As evidenced by: Patient will report being stable on medications - symptoms have decreased    4.  Goal(s): Refer for outpatient follow up   Met:  No  Target date: d/c  As evidenced by: Follow up appointment will be scheduled  Attendees: Patient:  [name] [name] 02/22/2012 10:58 AM  Nursing:  Burnetta Sabin, RN   02/22/2012 10:58 AM  Physician:  Orson Aloe, MD 02/22/2012 10:34 AM   Nursing:   Barrie Folk, RN 02/22/2012 10:34 AM   CaseManager:  Juline Patch, LCSW 02/22/2012 10:34 AM   Counselor:  Angus Palms, LCSW 02/22/2012 10:34 AM   Other:  Reyes Ivan, LCSWA     02/22/2012 11:01 AM
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
## 2012-02-22 NOTE — Progress Notes (Signed)
02/22/2011 @ 2330. D. Patient awake in bed stated "I need someone to get in bed and tuck me in. I need someone to cuddle with me. A. This writer redirected the patient to try to close eyes and rest so that patient could get a full night of rest. R. Patient closed eyes and said "okay." Patient remains safe on the unit at this time.  Janifer Gieselman, Newton Pigg

## 2012-02-22 NOTE — Progress Notes (Signed)
[facility] Group Notes: (Counselor/Nursing/MHT/Case Management/Adjunct) 02/22/2012   @ 1:15-2:30PM Feelings About Diagnosis  Type of Therapy:  Group Therapy  Participation Level:  None  Participation Quality: None    Affect:  Blunted  Cognitive:  Appropriate  Insight:  None  Engagement in Group: None  Engagement in Therapy:  None  Modes of Intervention:  Support and Exploration  Summary of Progress/Problems: [name] [name] slept through the majority of group and was not engaged while awake.   Angus Palms, LCSW 02/22/2012  3:03 PM

## 2012-02-22 NOTE — Progress Notes (Signed)
Pt observed in the dayroom watching TV.  Minimal interaction with other patients.  Asking for Motrin, but pt informed it is too early.  Pt attended evening group on the 500 hall, even though he had been told he was to program on the 300 hall.  He denies SI/HI/AV with this Clinical research associate.  Other than med request he voices no other needs/concerns.  Pt makes his needs known to staff.  Safety maintained with q15 minute checks.

## 2012-02-22 NOTE — Progress Notes (Signed)
I have seen Mr Mears at bed side and reviewed his chart. Main issue is pain control. He says that his pain is well controlled on fentanyl and vicodeine, and is willing to compromise to fentanyl patch while in house. He should eventually resume to his regular regimen per his pain management provider. We do not have much else to add from a medical standpoint. Thank you very much for consult. Please feel free to call with questions.   We will sign off.  Skylie Hiott,MD pager#[phone].

## 2012-02-22 NOTE — Progress Notes (Signed)
Grief and Loss Group  The group included six participants all of whom were mentally attentive and engaged in the theme of the group discussion. Those who participated focused primarily on awareness of loss in relationship to other key individuals in their lives and how they desire to be viewed by these persons. They talked about how family roles had impacted their expectations of themselves and that this was a time in which they were having to reassess these expectations.   Pt. Was attentive and seemed connected to the discussion, but did not verbally participate.   Kathlyn Sacramento Director Spiritual Care Dept.  [phone]
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
## 2012-02-22 NOTE — Progress Notes (Signed)
Patient ID: EYOB GODLEWSKI, male   DOB: May 02, 1966, 46 y.o.   MRN: [id number]  D: Pt was laying in bed when the writer entered the room. Writer attempted to encourage pt to attend group. "If they get my medications right, then I'll go to group". Pt informed the writer that he's been in constant pain and was not getting his pain meds. Writer asked the about the circumstances surrounding his adm. "Somebody said I was suicidal".  Writer looked at her watch in an effort to document the time of the assessment. Pt yelled, "I don't care if you don't believe me. Startled, the Clinical research associate asked pt what made him say that. Pt yelled the same statement several more times. Finally the writer was able to convince the pt that Clinical research associate wasn't being disrespectful and had in deed listened and wrote down his concerns. Once the pt was convinced, he was able to calm down and discuss issues with the Clinical research associate. Writer informed the pt that most people on his hall are not on the type of meds he's on, and asked the pt to be discreet. "Oh yeah, don't worry, that's gonna be between me and you. They can just think you're giving me a regular pill."  A:  Writer adm prn meds. Support and encouragement was offered. 15 min checks continued.  R:  Pt remains safe.

## 2012-02-22 NOTE — Progress Notes (Signed)
[name] [name] did not attend until five minutes before group ended. He stated that his recovery goal is to spend more time with his family.

## 2012-02-22 NOTE — Progress Notes (Signed)
02/22/2012 @ 0115. D. Patient awake in bed and stated to this writer "I need someone to tell me a good-night bed-time story." A. This Clinical research associate stated that patient needed to "close eyes and try to sleep." This Clinical research associate reported patient behavior to Charity fundraiser. R. Patient responded to redirected and closed eyes while in bed. Patient remains safe on unit at this time.  Foday Cone, [place]

## 2012-02-23 MED ORDER — PROPRANOLOL HCL 20 MG PO TABS
20.0000 mg | ORAL_TABLET | Freq: Four times a day (QID) | ORAL | Status: DC
  Administered 2012-02-24 (×3): 20 mg via ORAL
  Filled 2012-02-23 (×9): qty 1

## 2012-02-23 MED ORDER — PROPRANOLOL HCL 10 MG PO TABS
10.0000 mg | ORAL_TABLET | Freq: Four times a day (QID) | ORAL | Status: AC
  Administered 2012-02-23 (×2): 10 mg via ORAL
  Filled 2012-02-23 (×4): qty 1

## 2012-02-23 NOTE — Progress Notes (Signed)
D: Patient denies SI/HI and A/V hallucinations; patient reported that his sleep was fair because he could not sleep through the night;  reported that his appetite is poor; energy level is low; ability to pay attention is good;  Rated depression and hopelessness at 1/10; complaints of back pain that is chronic A: emotional support offered and medication administered and rest and relaxation; q 15 minute checks; encouraged to attend groups; encouraged to talk to staff with any feelings and/or concerns R: ; patient is pleasant but guarded; interacting with staff and peers is minimal and participates in some groups; set goal to talk to staff with any feelings and/or concerns

## 2012-02-23 NOTE — Progress Notes (Signed)
[facility] Group Notes:  (Counselor/Nursing/MHT/Case Management/Adjunct)  02/23/2012 8:36 PM  Type of Therapy:  Psychoeducational Skills  Participation Level:  None  Participation Quality:  none  Affect:  Flat  Cognitive:  Appropriate  Insight:  None  Engagement in Group:  None  Engagement in Therapy:  None  Modes of Intervention:  Problem-solving  Summary of Progress/Problems: Pt refused to come to Wrap up group tonight, stated he was hurting and just wanted to lie down.   Euclide Granito L 02/23/2012, 8:36 PM

## 2012-02-23 NOTE — Progress Notes (Signed)
02/23/2012      Time: 1500      Group Topic/Focus: The focus of this group is on discussing various styles of communication and communicating assertively using 'I' (feeling) statements.  Participation Level: Active  Participation Quality: Attentive  Affect: Blunted  Cognitive: Oriented   Additional Comments: None.   Ambert Virrueta 02/23/2012 3:43 PM 

## 2012-02-23 NOTE — Progress Notes (Signed)
D: Patient in bed at the beginning of this shift. Although he reported a better day, denied SI/HI but endorsed auditory hallucinations. Pt reported that his pain has been well managed today and the pain is under control. His mood and affect flat and depressed and no interaction noted between patient and peers, since he isolates in his room.  A: Pt encouraged and supported. Writer asked patient what the voices were; Pt stated he would rather not talk about it. Writer gave patient his 2000 medication. R: Patient received medication without any difficulty and tolerates medications. Q 15 minute check continues to maintain safety.

## 2012-02-23 NOTE — Progress Notes (Signed)
[facility] Group Notes:  (Counselor/Nursing/MHT/Case Management/Adjunct)  02/23/2012 5:35 PM  Type of Therapy:  Psychoeducational Skills  Participation Level:  Minimal  Participation Quality:  Appropriate and Attentive  Affect:  Blunted  Cognitive:  Appropriate and Oriented  Insight:  Good  Engagement in Group:  Limited  Engagement in Therapy:  n/a  Modes of Intervention:  Activity, Education, Problem-solving, Socialization and Support  Summary of Progress/Problems: [name] [name] attended Psychoeducational group that focused on using quality time with support systems/individuals to engage in health coping skills. [name] [name] participated in activity guessing about self and peers. [name] [name] was quiet but attentive while group discussed who their support systems are, how they can spend positive quality time with them as a coping skill and a way to strengthen the relationship. [name] [name] was given a homework assignment to find two ways to improve his support systems and twenty activities he can do spend quality time with his supports.    Wandra Scot 02/23/2012, 5:35 PM

## 2012-02-23 NOTE — Progress Notes (Addendum)
D: Patient came to the window with complain of headache at the beginning of this shift. He rated his back pain at 9 on a scale of 1-10 with 10 the worst, and also requested medication for sleep. His mood and affect seemed appropriate during this assessment. A: PRN Vistaril 50 mg and Ibuprofen 800 mg given for insomnia and pain. R: Pt received medications without difficulty and tolerated medications. Q 15 minute check continues to maintain safety.

## 2012-02-23 NOTE — Progress Notes (Signed)
Patient seen during d/c planning group.  He advised of being better today and denies SI/HI.  He rates depression at zero and anxiety at five.  Patient continues to endorse difficulty sleeping.  Per patient request, letter faxed to his attorney.  Writer contacted [employer] of Courts office and was informed patient's case pending and would not be called within the next week.
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
## 2012-02-23 NOTE — Progress Notes (Signed)
Psychoeducational Group Note  Date:  02/23/2012 Time:  1100  Group Topic/Focus:  Personal Choices and Values:   The focus of this group is to help patients assess and explore the importance of values in their lives, how their values affect their decisions, how they express their values and what opposes their expression.  Participation Level:  Active  Participation Quality:  Appropriate, Attentive and Supportive  Affect:  Appropriate  Cognitive:  Alert and Appropriate  Insight:  Good  Engagement in Group:  Good  Additional Comments:  Pt is very active and appropriate while attending group. Pt shared that two of his top personal values are having a close family and being a good parent. Pt also stated that he wanted to get the pain that he was having in his back.  Sharyn Lull 02/23/2012, 2:22 PM

## 2012-02-23 NOTE — Progress Notes (Signed)
[facility] Group Notes: (Counselor/Nursing/MHT/Case Management/Adjunct) 02/23/2012   1:15-2:30pm Emotion Regulation  Type of Therapy:  Group Therapy  Participation Level:  None  Participation Quality:  Attentive  Affect:  Blunted  Cognitive:  Appropriate  Insight:  None  Engagement in Group: Minimal  Engagement in Therapy:  None  Modes of Intervention:  Support and Exploration  Summary of Progress/Problems: [name] [name] was attentive but not engaged in group process    Angus Palms, LCSW 02/23/2012  4:05 PM

## 2012-02-24 DIAGNOSIS — F1920 Other psychoactive substance dependence, uncomplicated: Secondary | ICD-10-CM

## 2012-02-24 MED ORDER — FENTANYL 50 MCG/HR TD PT72: 1.0000 | MEDICATED_PATCH | TRANSDERMAL | Status: DC

## 2012-02-24 MED ORDER — RISPERIDONE 2 MG PO TABS: 2.0000 mg | ORAL_TABLET | Freq: Every day | ORAL | Status: DC

## 2012-02-24 MED ORDER — PANTOPRAZOLE SODIUM 20 MG PO TBEC: 20.0000 mg | DELAYED_RELEASE_TABLET | Freq: Two times a day (BID) | ORAL | Status: DC

## 2012-02-24 MED ORDER — LIDOCAINE 5 % EX PTCH: 1.0000 | MEDICATED_PATCH | CUTANEOUS | Status: AC

## 2012-02-24 MED ORDER — AMITRIPTYLINE HCL 25 MG PO TABS: 25.0000 mg | ORAL_TABLET | Freq: Every day | ORAL | Status: DC

## 2012-02-24 MED ORDER — HYDROXYZINE HCL 50 MG PO TABS: 50.0000 mg | ORAL_TABLET | Freq: Three times a day (TID) | ORAL | Status: AC | PRN

## 2012-02-24 MED ORDER — IBUPROFEN 800 MG PO TABS: 800.0000 mg | ORAL_TABLET | Freq: Four times a day (QID) | ORAL | Status: AC | PRN

## 2012-02-24 MED ORDER — PROPRANOLOL HCL ER 60 MG PO CP24: 60.0000 mg | ORAL_CAPSULE | Freq: Every day | ORAL | Status: DC

## 2012-02-24 MED ORDER — MELOXICAM 7.5 MG PO TABS: 7.5000 mg | ORAL_TABLET | Freq: Every day | ORAL | Status: AC

## 2012-02-24 MED ORDER — METHOCARBAMOL 500 MG PO TABS: 500.0000 mg | ORAL_TABLET | Freq: Three times a day (TID) | ORAL | Status: AC

## 2012-02-24 MED ORDER — DULOXETINE HCL 20 MG PO CPEP: 20.0000 mg | ORAL_CAPSULE | Freq: Every day | ORAL | Status: DC

## 2012-02-24 MED ORDER — GABAPENTIN 300 MG PO CAPS: 300.0000 mg | ORAL_CAPSULE | Freq: Four times a day (QID) | ORAL | Status: DC

## 2012-02-24 MED ORDER — RISPERIDONE 0.25 MG PO TABS: 0.2500 mg | ORAL_TABLET | Freq: Two times a day (BID) | ORAL | Status: DC

## 2012-02-24 NOTE — Progress Notes (Signed)
        [facility] Group Notes: (Counselor/Nursing/MHT/Case Management/Adjunct) 02/24/2012   @1 :15pm [facility] [place]  Type of Therapy:  Group Therapy  Participation Level:  Good  Participation Quality:  Good  Affect:  Appropriate  Cognitive:  Appropriate  Insight:  Good  Engagement in Group:  Good  Engagement in Therapy:  Good  Modes of Intervention:  Support and Exploration  Summary of Progress/Problems: [name] [name] participated with speaker from [facility] [place] and expressed interest in programs [facility] offers. While he was not very open to relating with her, he stated that the thought the programs at [facility] are very needed.    Billie Lade 02/24/2012  2:59 PM

## 2012-02-24 NOTE — Progress Notes (Addendum)
[facility] Case Management Discharge Plan:  Will you be returning to the same living situation after discharge: Yes,  Patient will return to his home At discharge, do you have transportation home?:Yes,  Patient to arrange transportation home Do you have the ability to pay for your medications:Yes,  Patient can afford medications  Interagency Information:     Release of information consent forms completed and in the chart;  Patient's signature needed at discharge.  Patient to Follow up at:  Monday, July 29, 20123 @ 7:45 AM    [facility]    [phone]    [address]    [place], [place]  [address]    Patient denies SI/HI:   Yes,  Patient not endorsing SI/HI or other thoughts of self harm    Safety Planning and Suicide Prevention discussed:  Yes,  Reviewed during after care groups  Barrier to discharge identified:Yes,  Limited support  Summary and Recommendations: Patient encourage to be compliant with medications and follow up with outpatient recommedations    Keghan Mcfarren, [place] July 02/24/2012, 11:07 AM

## 2012-02-24 NOTE — Progress Notes (Signed)
Patient ID: [name] [name], male   DOB: May 15, 1966, 46 y.o.   MRN: [id number] Patient denies SI/HI and A/V hallucinations; reviewed AVS and verbalized understanding; samples, copy of AVS, copy of legal charges, and copy of note excusing him from court and faxes to the lawyer given to patient; did not have any belongings in a locker; suicide risk assessment done; patient's son and grandchildren were waiting for him in the lobby; witnessed by [facility] RN
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
## 2012-02-24 NOTE — Progress Notes (Signed)
Psychoeducational Group Note  Date:  02/24/2012 Time:  1100  Group Topic/Focus:  Self Esteem Action Plan:   The focus of this group is to help patients create a plan to continue to build self-esteem after discharge.  Participation Level: Did Not Attend  Participation Quality:  Not Applicable  Affect:  Not Applicable  Cognitive:  Not Applicable  Insight:  Not Applicable  Engagement in Group: Not Applicable  Additional Comments:  Pt refused to attend group this morning.  Lucas Winograd E 02/24/2012, 1:48 PM
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
## 2012-02-24 NOTE — Tx Team (Signed)
Interdisciplinary Treatment Plan Update (Adult)  Date:  02/24/2012  Time Reviewed:  11:00 AM   Progress in Treatment: Attending groups:   Yes   Participating in groups:  Yes Taking medication as prescribed:  Yes Tolerating medication:  Yes Family/Significant othe contact made: Counselor to make contact with son Patient understands diagnosis:  Yes Discussing patient identified problems/goals with staff: Yes Medical problems stabilized or resolved: No - Patient endorses back pain Denies suicidal/homicidal ideation:Yes Issues/concerns per patient self-inventory:  Other:  New problem(s) identified:  Reason for Continuation of Hospitalization:  Interventions implemented related to continuation of hospitalization:  Additional comments:  Estimated length of stay: Discharge today  Discharge Plan: Home with follow up with [facility]  New goal(s):  Review of initial/current patient goals per problem list:    1.  Goal(s):   Reduce depression/anxiety    Met:  Yes  Target date: d/c  As evidenced [id number] currently rating symptoms at at zero  2.  Goal (s): .stabilize on meds   Met:  Yes  Target date: d/c  As evidenced by: Patient reports being stable on medications - symptoms have decreased    3.  Goal(s): Refer for outpatient follow up   Met:  Yes  Target date: d/c  As evidenced by: Follow up appointment scheduled    Attendees: Patient:  [name] [name] 02/24/2012 11:05 AM  Nursing:  Celso Sickle, RN  02/24/2012 11:05 AM  Physician:  Orson Aloe, MD 02/24/2012 11:00 AM   Nursing:  Barrie Folk, RN 02/24/2012 11:00 AM   CaseManager:  Juline Patch, LCSW 02/24/2012 11:00 AM   Counselor:  Angus Palms, LCSW 02/24/2012 11:00 AM   Other:  Berneice Heinrich, RN       02/24/2012 11:05 AM Other:  Carney Living, RN      02/24/2012 11:06 AM

## 2012-02-25 NOTE — Progress Notes (Signed)
Patient Discharge Instructions:  After Visit Summary (AVS):   Faxed to:  02/24/2012 Psychiatric Admission Assessment Note:   Faxed to:  02/24/2012 Suicide Risk Assessment - Discharge Assessment:   Faxed to:  02/24/2012 Faxed/Sent to the Next Level Care provider:  02/24/2012  Faxed to [facility] @ [phone]  Heloise Purpura, Eduard Clos, 02/25/2012, 3:42 PM

## 2013-03-27 ENCOUNTER — Emergency Department (HOSPITAL_COMMUNITY): Payer: Medicare PPO

## 2013-03-27 ENCOUNTER — Encounter (HOSPITAL_COMMUNITY): Payer: Self-pay

## 2013-03-27 ENCOUNTER — Emergency Department (HOSPITAL_COMMUNITY)
Admission: EM | Admit: 2013-03-27 | Discharge: 2013-03-27 | Disposition: A | Discharge status: Home / Self Care | Payer: Medicare PPO | Attending: Emergency Medicine | Admitting: Emergency Medicine

## 2013-03-27 DIAGNOSIS — R0789 Other chest pain: Secondary | ICD-10-CM | POA: Insufficient documentation

## 2013-03-27 DIAGNOSIS — J209 Acute bronchitis, unspecified: Secondary | ICD-10-CM | POA: Insufficient documentation

## 2013-03-27 DIAGNOSIS — R062 Wheezing: Secondary | ICD-10-CM | POA: Insufficient documentation

## 2013-03-27 DIAGNOSIS — R197 Diarrhea, unspecified: Secondary | ICD-10-CM | POA: Insufficient documentation

## 2013-03-27 DIAGNOSIS — Z9889 Other specified postprocedural states: Secondary | ICD-10-CM | POA: Insufficient documentation

## 2013-03-27 DIAGNOSIS — Z79899 Other long term (current) drug therapy: Secondary | ICD-10-CM | POA: Insufficient documentation

## 2013-03-27 DIAGNOSIS — J40 Bronchitis, not specified as acute or chronic: Secondary | ICD-10-CM

## 2013-03-27 DIAGNOSIS — R059 Cough, unspecified: Secondary | ICD-10-CM | POA: Insufficient documentation

## 2013-03-27 DIAGNOSIS — R05 Cough: Secondary | ICD-10-CM | POA: Insufficient documentation

## 2013-03-27 DIAGNOSIS — R509 Fever, unspecified: Secondary | ICD-10-CM | POA: Insufficient documentation

## 2013-03-27 DIAGNOSIS — Z8781 Personal history of (healed) traumatic fracture: Secondary | ICD-10-CM | POA: Insufficient documentation

## 2013-03-27 DIAGNOSIS — Z8669 Personal history of other diseases of the nervous system and sense organs: Secondary | ICD-10-CM | POA: Insufficient documentation

## 2013-03-27 DIAGNOSIS — R112 Nausea with vomiting, unspecified: Secondary | ICD-10-CM | POA: Insufficient documentation

## 2013-03-27 DIAGNOSIS — F17200 Nicotine dependence, unspecified, uncomplicated: Secondary | ICD-10-CM | POA: Insufficient documentation

## 2013-03-27 DIAGNOSIS — F319 Bipolar disorder, unspecified: Secondary | ICD-10-CM | POA: Insufficient documentation

## 2013-03-27 MED ORDER — AEROCHAMBER Z-STAT PLUS/MEDIUM MISC: Status: DC

## 2013-03-27 MED ORDER — AZITHROMYCIN 250 MG PO TABS: ORAL_TABLET | ORAL | Status: DC

## 2013-03-27 MED ORDER — ALBUTEROL SULFATE HFA 108 (90 BASE) MCG/ACT IN AERS: 2.0000 | INHALATION_SPRAY | RESPIRATORY_TRACT | Status: DC | PRN

## 2013-03-27 MED ORDER — PREDNISONE 20 MG PO TABS: ORAL_TABLET | ORAL | Status: DC

## 2013-03-27 MED ORDER — DM-GUAIFENESIN ER 30-600 MG PO TB12: 1.0000 | ORAL_TABLET | Freq: Two times a day (BID) | ORAL | Status: DC

## 2013-03-27 NOTE — ED Notes (Signed)
Pt has implanted spinal stimulator and unable to obtain ekg due to electrical activity from stimulator.  Pt says he has a remote but doesn't have it with him.  Put pt on monitor and printed strip.  Showed strip to EDP.

## 2013-03-27 NOTE — ED Notes (Signed)
Pt reports has had episodes of cough and SOB for the past 2 months.  Reports will get a little better then worse again.  Reports fever last night.  Says cough is dry.  Reports chest pain and pain in back between shoulder blades.  Reports pain is intermittent but isn't affected by movement or coughing.  Denies noticing any swelling in extremities.
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
## 2013-10-25 ENCOUNTER — Emergency Department (HOSPITAL_COMMUNITY): Payer: Medicare PPO

## 2013-10-25 ENCOUNTER — Emergency Department (HOSPITAL_COMMUNITY)
Admission: EM | Admit: 2013-10-25 | Discharge: 2013-10-25 | Disposition: A | Discharge status: Home / Self Care | Payer: Medicare PPO | Attending: Emergency Medicine | Admitting: Emergency Medicine

## 2013-10-25 ENCOUNTER — Encounter (HOSPITAL_COMMUNITY): Payer: Self-pay | Admitting: Emergency Medicine

## 2013-10-25 DIAGNOSIS — Z8781 Personal history of (healed) traumatic fracture: Secondary | ICD-10-CM | POA: Insufficient documentation

## 2013-10-25 DIAGNOSIS — Z8669 Personal history of other diseases of the nervous system and sense organs: Secondary | ICD-10-CM | POA: Insufficient documentation

## 2013-10-25 DIAGNOSIS — Z79899 Other long term (current) drug therapy: Secondary | ICD-10-CM | POA: Insufficient documentation

## 2013-10-25 DIAGNOSIS — F17200 Nicotine dependence, unspecified, uncomplicated: Secondary | ICD-10-CM | POA: Insufficient documentation

## 2013-10-25 DIAGNOSIS — F319 Bipolar disorder, unspecified: Secondary | ICD-10-CM | POA: Insufficient documentation

## 2013-10-25 DIAGNOSIS — R05 Cough: Secondary | ICD-10-CM

## 2013-10-25 DIAGNOSIS — R5383 Other fatigue: Secondary | ICD-10-CM

## 2013-10-25 DIAGNOSIS — R059 Cough, unspecified: Secondary | ICD-10-CM | POA: Insufficient documentation

## 2013-10-25 DIAGNOSIS — R5381 Other malaise: Secondary | ICD-10-CM | POA: Insufficient documentation

## 2013-10-25 MED ORDER — ALBUTEROL SULFATE HFA 108 (90 BASE) MCG/ACT IN AERS
2.0000 | INHALATION_SPRAY | Freq: Once | RESPIRATORY_TRACT | Status: AC
  Administered 2013-10-25: 2 via RESPIRATORY_TRACT
  Filled 2013-10-25: qty 6.7

## 2013-10-25 MED ORDER — AZITHROMYCIN 250 MG PO TABS: ORAL_TABLET | ORAL | Status: DC

## 2013-10-25 MED ORDER — GUAIFENESIN-CODEINE 100-10 MG/5ML PO SYRP: 10.0000 mL | ORAL_SOLUTION | Freq: Three times a day (TID) | ORAL | Status: DC | PRN

## 2013-10-25 MED ORDER — PREDNISONE 10 MG PO TABS: ORAL_TABLET | ORAL | Status: DC

## 2013-10-25 NOTE — ED Notes (Signed)
Pt says he is having headache,, neck hurts. Nasal congestion. "for months".  Sees Dr   Carloyn Manner for back pain.  "I have flu sx"  Took claritin without relief

## 2013-10-25 NOTE — Discharge Instructions (Signed)
Cough, Adult  A cough is a reflex. It helps you clear your throat and airways. A cough can help heal your body. A cough can last 2 or 3 weeks (acute) or may last more than 8 weeks (chronic). Some common causes of a cough can include an infection, allergy, or a cold. HOME CARE  Only take medicine as told by your doctor.  If given, take your medicines (antibiotics) as told. Finish them even if you start to feel better.  Use a cold steam vaporizer or humidier in your home. This can help loosen thick spit (secretions).  Sleep so you are almost sitting up (semi-upright). Use pillows to do this. This helps reduce coughing.  Rest as needed.  Stop smoking if you smoke. GET HELP RIGHT AWAY IF:  You have yellowish-white fluid (pus) in your thick spit.  Your cough gets worse.  Your medicine does not reduce coughing, and you are losing sleep.  You cough up blood.  You have trouble breathing.  Your pain gets worse and medicine does not help.  You have a fever. MAKE SURE YOU:   Understand these instructions.  Will watch your condition.  Will get help right away if you are not doing well or get worse. Document Released: 04/01/2011 Document Revised: 10/11/2011 Document Reviewed: 04/01/2011 Exit[employer] Patient Information 2014 [employer].  

## 2013-10-25 NOTE — ED Notes (Signed)
Pt reports nasal congestion, nausea, malaise for a few months. Denies fevers, cough, vomiting and diarrhea.
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
## 2013-10-31 NOTE — ED Provider Notes (Signed)
Medical screening examination/treatment/procedure(s) were performed by non-physician practitioner and as supervising physician I was immediately available for consultation/collaboration.   EKG Interpretation None        Maudry Diego, MD 10/31/13 1511

## 2013-12-02 ENCOUNTER — Emergency Department (HOSPITAL_COMMUNITY): Payer: Medicare PPO

## 2013-12-02 ENCOUNTER — Encounter (HOSPITAL_COMMUNITY): Payer: Self-pay | Admitting: Emergency Medicine

## 2013-12-02 ENCOUNTER — Emergency Department (HOSPITAL_COMMUNITY)
Admission: EM | Admit: 2013-12-02 | Discharge: 2013-12-02 | Disposition: A | Discharge status: Home / Self Care | Payer: Medicare PPO | Attending: Emergency Medicine | Admitting: Emergency Medicine

## 2013-12-02 DIAGNOSIS — Z981 Arthrodesis status: Secondary | ICD-10-CM | POA: Insufficient documentation

## 2013-12-02 DIAGNOSIS — Z8669 Personal history of other diseases of the nervous system and sense organs: Secondary | ICD-10-CM | POA: Insufficient documentation

## 2013-12-02 DIAGNOSIS — F17200 Nicotine dependence, unspecified, uncomplicated: Secondary | ICD-10-CM | POA: Insufficient documentation

## 2013-12-02 DIAGNOSIS — F329 Major depressive disorder, single episode, unspecified: Secondary | ICD-10-CM | POA: Insufficient documentation

## 2013-12-02 DIAGNOSIS — Z9889 Other specified postprocedural states: Secondary | ICD-10-CM | POA: Insufficient documentation

## 2013-12-02 DIAGNOSIS — IMO0002 Reserved for concepts with insufficient information to code with codable children: Secondary | ICD-10-CM | POA: Insufficient documentation

## 2013-12-02 DIAGNOSIS — Y92410 Unspecified street and highway as the place of occurrence of the external cause: Secondary | ICD-10-CM | POA: Insufficient documentation

## 2013-12-02 DIAGNOSIS — Z79899 Other long term (current) drug therapy: Secondary | ICD-10-CM | POA: Insufficient documentation

## 2013-12-02 DIAGNOSIS — F3289 Other specified depressive episodes: Secondary | ICD-10-CM | POA: Insufficient documentation

## 2013-12-02 DIAGNOSIS — S32009A Unspecified fracture of unspecified lumbar vertebra, initial encounter for closed fracture: Secondary | ICD-10-CM | POA: Insufficient documentation

## 2013-12-02 DIAGNOSIS — Y9389 Activity, other specified: Secondary | ICD-10-CM | POA: Insufficient documentation

## 2013-12-02 MED ORDER — MORPHINE SULFATE 4 MG/ML IJ SOLN
4.0000 mg | Freq: Once | INTRAMUSCULAR | Status: AC
  Administered 2013-12-02: 4 mg via INTRAVENOUS
  Filled 2013-12-02: qty 1

## 2013-12-02 MED ORDER — HYDROMORPHONE HCL PF 1 MG/ML IJ SOLN
1.0000 mg | Freq: Once | INTRAMUSCULAR | Status: AC
  Administered 2013-12-02: 1 mg via INTRAVENOUS
  Filled 2013-12-02: qty 1

## 2013-12-02 MED ORDER — ALBUTEROL SULFATE HFA 108 (90 BASE) MCG/ACT IN AERS
2.0000 | INHALATION_SPRAY | Freq: Once | RESPIRATORY_TRACT | Status: AC
  Administered 2013-12-02: 2 via RESPIRATORY_TRACT
  Filled 2013-12-02: qty 6.7

## 2013-12-02 NOTE — ED Notes (Signed)
Pt with back stimulator implanted to R lower back.

## 2013-12-02 NOTE — Discharge Instructions (Signed)
Back, Compression Fracture °A compression fracture happens when a force is put upon the length of your spine. Slipping and falling on your bottom are examples of such a force. When this happens, sometimes the force is great enough to compress the building blocks (vertebral bodies) of your spine. Although this causes a lot of pain, this can usually be treated at home, unless your caregiver feels hospitalization is needed for pain control. °Your backbone (spinal column) is made up of 24 main vertebral bodies in addition to the sacrum and coccyx (see illustration). These are held together by tough fibrous tissues (ligaments) and by support of your muscles. Nerve roots pass through the openings between the vertebrae. A sudden wrenching move, injury, or a fall may cause a compression fracture of one of the vertebral bodies. This may result in back pain or spread of pain into the belly (abdomen), the buttocks, and down the leg into the foot. Pain may also be created by muscle spasm alone. °Large studies have been undertaken to determine the best possible course of action to help your back following injury and also to prevent future problems. The recommendations are as follows. °FOLLOWING A COMPRESSION FRACTURE: °Do the following only if advised by your caregiver.  °· If a back brace has been suggested or provided, wear it as directed. °· DO NOT stop wearing the back brace unless instructed by your caregiver. °· When allowed to return to regular activities, avoid a sedentary life style. Actively exercise. Sporadic weekend binges of tennis, racquetball, water skiing, may actually aggravate or create problems, especially if you are not in condition for that activity. °· Avoid sports requiring sudden body movements until you are in condition for them. Swimming and walking are safer activities. °· Maintain good posture. °· Avoid obesity. °· If not already done, you should have a DEXA scan. Based on the results, be treated for  osteoporosis. °FOLLOWING ACUTE (SUDDEN) INJURY: °· Only take over-the-counter or prescription medicines for pain, discomfort, or fever as directed by your caregiver. °· Use bed rest for only the most extreme acute episode. Prolonged bed rest may aggravate your condition. Ice used for acute conditions is effective. Use a large plastic bag filled with ice. Wrap it in a towel. This also provides excellent pain relief. This may be continuous. Or use it for 30 minutes every 2 hours during acute phase, then as needed. Heat for 30 minutes prior to activities is helpful. °· As soon as the acute phase (the time when your back is too painful for you to do normal activities) is over, it is important to resume normal activities and work hardening programs. Back injuries can cause potentially marked changes in lifestyle. So it is important to attack these problems aggressively. °· See your caregiver for continued problems. He or she can help or refer you for appropriate exercises, physical therapy and work hardening if needed. °· If you are given narcotic medications for your condition, for the next 24 hours DO NOT: °· Drive °· Operate machinery or power tools. °· Sign legal documents. °· DO NOT drink alcohol, take sleeping pills or other medications that may interfere with treatment. °If your caregiver has given you a follow-up appointment, it is very important to keep that appointment. Not keeping the appointment could result in a chronic or permanent injury, pain, and disability. If there is any problem keeping the appointment, you must call back to this facility for assistance.  °SEEK IMMEDIATE MEDICAL CARE IF: °· You develop numbness,   tingling, weakness, or problems with the use of your arms or legs. °· You develop severe back pain not relieved with medications. °· You have changes in bowel or bladder control. °· You have increasing pain in any areas of the body. °Document Released: 07/19/2005 Document Revised: 10/11/2011  Document Reviewed: 02/21/2008 °[employer]® Patient Information ©2014 [employer]. ° °

## 2013-12-02 NOTE — ED Notes (Signed)
Ortho tech paged  

## 2013-12-02 NOTE — ED Notes (Signed)
X-ray at bedside
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
## 2013-12-02 NOTE — Progress Notes (Signed)
Orthopedic Tech Progress Note Patient Details:  [name] [name] 1965/11/28 [id number] Called [employer] for brace  Patient ID: [name] [name], male   DOB: [birth year], 48 y.o.   MRN: [id number]   [name] 12/02/2013, 8:13 PM

## 2013-12-02 NOTE — ED Notes (Signed)
Pt given water to drink. 

## 2013-12-02 NOTE — Progress Notes (Signed)
Orthopedic Tech Progress Note Patient Details:  [name] [name] 1966/05/07 [id number] Brace order completed by [employer] vendor.  Patient ID: [name] [name], male   DOB: May 06, 1966, 47 y.o.   MRN: [id number]   [name] 12/02/2013, 9:46 PM

## 2013-12-02 NOTE — ED Notes (Signed)
[employer] at bedside to apply back brace. 

## 2013-12-02 NOTE — ED Notes (Signed)
Ortho at bedside.
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
## 2013-12-02 NOTE — ED Notes (Signed)
Pt transported to CT ?
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
## 2013-12-02 NOTE — ED Notes (Signed)
Discharge instructions reviewed with pt. Pt verbalized understanding.   
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
## 2014-03-07 ENCOUNTER — Ambulatory Visit (HOSPITAL_COMMUNITY)
Admission: RE | Admit: 2014-03-07 | Discharge: 2014-03-07 | Disposition: A | Discharge status: Home / Self Care | Payer: Medicare PPO | Source: Ambulatory Visit | Attending: Internal Medicine | Admitting: Internal Medicine

## 2014-03-07 ENCOUNTER — Other Ambulatory Visit (HOSPITAL_COMMUNITY): Payer: Self-pay | Admitting: Internal Medicine

## 2014-03-07 DIAGNOSIS — M25511 Pain in right shoulder: Secondary | ICD-10-CM

## 2014-03-07 DIAGNOSIS — M25519 Pain in unspecified shoulder: Secondary | ICD-10-CM | POA: Insufficient documentation

## 2014-03-21 ENCOUNTER — Ambulatory Visit (HOSPITAL_COMMUNITY)
Admission: RE | Admit: 2014-03-21 | Discharge: 2014-03-21 | Disposition: A | Discharge status: Home / Self Care | Payer: Medicare PPO | Source: Ambulatory Visit | Attending: Internal Medicine | Admitting: Internal Medicine

## 2014-03-21 ENCOUNTER — Other Ambulatory Visit (HOSPITAL_COMMUNITY): Payer: Self-pay | Admitting: Respiratory Therapy

## 2014-03-21 DIAGNOSIS — I158 Other secondary hypertension: Secondary | ICD-10-CM | POA: Insufficient documentation

## 2014-03-21 DIAGNOSIS — I159 Secondary hypertension, unspecified: Secondary | ICD-10-CM

## 2014-03-21 DIAGNOSIS — I10 Essential (primary) hypertension: Secondary | ICD-10-CM

## 2014-04-08 ENCOUNTER — Emergency Department (HOSPITAL_COMMUNITY): Payer: Medicare PPO

## 2014-04-08 ENCOUNTER — Emergency Department (HOSPITAL_COMMUNITY)
Admission: EM | Admit: 2014-04-08 | Discharge: 2014-04-08 | Disposition: A | Discharge status: Home / Self Care | Payer: Medicare PPO | Attending: Emergency Medicine | Admitting: Emergency Medicine

## 2014-04-08 ENCOUNTER — Encounter (HOSPITAL_COMMUNITY): Payer: Self-pay | Admitting: Emergency Medicine

## 2014-04-08 DIAGNOSIS — Z8781 Personal history of (healed) traumatic fracture: Secondary | ICD-10-CM | POA: Diagnosis not present

## 2014-04-08 DIAGNOSIS — Z87891 Personal history of nicotine dependence: Secondary | ICD-10-CM | POA: Diagnosis not present

## 2014-04-08 DIAGNOSIS — R05 Cough: Secondary | ICD-10-CM | POA: Insufficient documentation

## 2014-04-08 DIAGNOSIS — F319 Bipolar disorder, unspecified: Secondary | ICD-10-CM | POA: Insufficient documentation

## 2014-04-08 DIAGNOSIS — R059 Cough, unspecified: Secondary | ICD-10-CM | POA: Diagnosis present

## 2014-04-08 DIAGNOSIS — Z792 Long term (current) use of antibiotics: Secondary | ICD-10-CM | POA: Diagnosis not present

## 2014-04-08 DIAGNOSIS — Z79899 Other long term (current) drug therapy: Secondary | ICD-10-CM | POA: Insufficient documentation

## 2014-04-08 DIAGNOSIS — J40 Bronchitis, not specified as acute or chronic: Secondary | ICD-10-CM | POA: Insufficient documentation

## 2014-04-08 DIAGNOSIS — Z87828 Personal history of other (healed) physical injury and trauma: Secondary | ICD-10-CM | POA: Insufficient documentation

## 2014-04-08 LAB — CBC WITH DIFFERENTIAL/PLATELET
BASOS ABS: 0.1 10*3/uL (ref 0.0–0.1)
BASOS PCT: 1 % (ref 0–1)
Eosinophils Absolute: 0.2 10*3/uL (ref 0.0–0.7)
Eosinophils Relative: 2 % (ref 0–5)
HCT: 40.1 % (ref 39.0–52.0)
HEMOGLOBIN: 14.7 g/dL (ref 13.0–17.0)
Lymphocytes Relative: 45 % (ref 12–46)
Lymphs Abs: 3.8 10*3/uL (ref 0.7–4.0)
MCH: 30.9 pg (ref 26.0–34.0)
MCHC: 36.7 g/dL — ABNORMAL HIGH (ref 30.0–36.0)
MCV: 84.2 fL (ref 78.0–100.0)
MONOS PCT: 10 % (ref 3–12)
Monocytes Absolute: 0.8 10*3/uL (ref 0.1–1.0)
NEUTROS ABS: 3.4 10*3/uL (ref 1.7–7.7)
NEUTROS PCT: 42 % — AB (ref 43–77)
Platelets: 226 10*3/uL (ref 150–400)
RBC: 4.76 MIL/uL (ref 4.22–5.81)
RDW: 12.2 % (ref 11.5–15.5)
WBC: 8.2 10*3/uL (ref 4.0–10.5)

## 2014-04-08 LAB — BASIC METABOLIC PANEL
Anion gap: 14 (ref 5–15)
BUN: 14 mg/dL (ref 6–23)
CHLORIDE: 101 meq/L (ref 96–112)
CO2: 25 mEq/L (ref 19–32)
Calcium: 9 mg/dL (ref 8.4–10.5)
Creatinine, Ser: 0.89 mg/dL (ref 0.50–1.35)
GFR calc non Af Amer: >90 mL/min (ref >90)
Glucose, Bld: 110 mg/dL — ABNORMAL HIGH (ref 70–99)
POTASSIUM: 4 meq/L (ref 3.7–5.3)
Sodium: 140 mEq/L (ref 137–147)

## 2014-04-08 MED ORDER — AMOXICILLIN 500 MG PO CAPS: 500.0000 mg | ORAL_CAPSULE | Freq: Three times a day (TID) | ORAL | Status: DC

## 2014-04-08 MED ORDER — PROMETHAZINE HCL 25 MG PO TABS: 25.0000 mg | ORAL_TABLET | Freq: Four times a day (QID) | ORAL | Status: DC | PRN

## 2014-04-08 NOTE — ED Notes (Signed)
Pt c/o body aches, cough, chest congestion, nausea; denies vomiting or diarrhea or fever

## 2014-04-08 NOTE — ED Notes (Signed)
Patient verbalizes understanding of discharge instructions, prescription medications, and follow up care. Patient ambulatory out of department at this time. 
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
## 2014-04-08 NOTE — Discharge Instructions (Signed)
Drink plenty of fluids.  Follow up with your md if not improving.

## 2014-04-08 NOTE — ED Notes (Signed)
MD at bedside. 

## 2014-07-31 ENCOUNTER — Ambulatory Visit (HOSPITAL_COMMUNITY)
Admission: RE | Admit: 2014-07-31 | Discharge: 2014-07-31 | Disposition: A | Discharge status: Home / Self Care | Payer: Commercial Managed Care - HMO | Source: Ambulatory Visit | Attending: Internal Medicine | Admitting: Internal Medicine

## 2014-07-31 ENCOUNTER — Other Ambulatory Visit (HOSPITAL_COMMUNITY): Payer: Self-pay | Admitting: Internal Medicine

## 2014-07-31 DIAGNOSIS — R6889 Other general symptoms and signs: Secondary | ICD-10-CM | POA: Diagnosis present

## 2014-07-31 DIAGNOSIS — Z8709 Personal history of other diseases of the respiratory system: Secondary | ICD-10-CM | POA: Insufficient documentation

## 2014-07-31 DIAGNOSIS — R05 Cough: Secondary | ICD-10-CM | POA: Insufficient documentation

## 2014-07-31 DIAGNOSIS — J9809 Other diseases of bronchus, not elsewhere classified: Secondary | ICD-10-CM

## 2014-07-31 DIAGNOSIS — R079 Chest pain, unspecified: Secondary | ICD-10-CM | POA: Insufficient documentation

## 2014-08-29 ENCOUNTER — Other Ambulatory Visit (HOSPITAL_COMMUNITY): Payer: Self-pay | Admitting: Respiratory Therapy

## 2014-08-29 DIAGNOSIS — G479 Sleep disorder, unspecified: Secondary | ICD-10-CM | POA: Diagnosis not present

## 2014-08-29 DIAGNOSIS — M545 Low back pain: Secondary | ICD-10-CM | POA: Diagnosis not present

## 2014-08-29 DIAGNOSIS — I10 Essential (primary) hypertension: Secondary | ICD-10-CM | POA: Diagnosis not present

## 2014-08-29 DIAGNOSIS — R0600 Dyspnea, unspecified: Secondary | ICD-10-CM

## 2014-08-30 ENCOUNTER — Other Ambulatory Visit (HOSPITAL_COMMUNITY): Payer: Self-pay | Admitting: Respiratory Therapy

## 2014-08-30 DIAGNOSIS — G4730 Sleep apnea, unspecified: Secondary | ICD-10-CM

## 2014-09-02 DIAGNOSIS — F1020 Alcohol dependence, uncomplicated: Secondary | ICD-10-CM | POA: Diagnosis not present

## 2014-09-04 ENCOUNTER — Ambulatory Visit (HOSPITAL_COMMUNITY)
Admission: RE | Admit: 2014-09-04 | Discharge: 2014-09-04 | Disposition: A | Discharge status: Home / Self Care | Payer: Medicare PPO | Source: Ambulatory Visit | Attending: Pulmonary Disease | Admitting: Pulmonary Disease

## 2014-09-04 MED ORDER — ALBUTEROL SULFATE (2.5 MG/3ML) 0.083% IN NEBU: 2.5000 mg | INHALATION_SOLUTION | Freq: Once | RESPIRATORY_TRACT | Status: DC

## 2014-09-11 ENCOUNTER — Inpatient Hospital Stay (HOSPITAL_COMMUNITY): Admission: RE | Admit: 2014-09-11 | Payer: Medicare PPO | Source: Ambulatory Visit

## 2014-09-13 ENCOUNTER — Inpatient Hospital Stay (HOSPITAL_COMMUNITY): Admission: RE | Admit: 2014-09-13 | Payer: Medicare PPO | Source: Ambulatory Visit

## 2014-09-17 ENCOUNTER — Ambulatory Visit: Payer: Commercial Managed Care - HMO | Admitting: Sleep Medicine

## 2014-09-17 DIAGNOSIS — G4730 Sleep apnea, unspecified: Secondary | ICD-10-CM

## 2014-09-18 ENCOUNTER — Other Ambulatory Visit (HOSPITAL_COMMUNITY): Payer: Self-pay | Admitting: Respiratory Therapy

## 2014-09-18 ENCOUNTER — Other Ambulatory Visit (HOSPITAL_COMMUNITY): Payer: Self-pay

## 2014-09-18 ENCOUNTER — Ambulatory Visit (HOSPITAL_COMMUNITY)
Admission: RE | Admit: 2014-09-18 | Discharge: 2014-09-18 | Disposition: A | Discharge status: Home / Self Care | Payer: Commercial Managed Care - HMO | Source: Ambulatory Visit | Attending: Pulmonary Disease | Admitting: Pulmonary Disease

## 2014-09-18 DIAGNOSIS — R0602 Shortness of breath: Secondary | ICD-10-CM

## 2014-09-18 DIAGNOSIS — G4730 Sleep apnea, unspecified: Secondary | ICD-10-CM | POA: Diagnosis not present

## 2014-09-18 LAB — PULMONARY FUNCTION TEST
DL/VA % pred: 76 %
DL/VA: 3.43 ml/min/mmHg/L
DLCO COR % PRED: 84 %
DLCO UNC % PRED: 84 %
DLCO UNC: 23.9 ml/min/mmHg
DLCO cor: 23.9 ml/min/mmHg
FEF 25-75 PRE: 3.01 L/s
FEF 25-75 Post: 3.44 L/sec
FEF2575-%Change-Post: 14 %
FEF2575-%Pred-Post: 104 %
FEF2575-%Pred-Pre: 91 %
FEV1-%CHANGE-POST: 0 %
FEV1-%Pred-Post: 88 %
FEV1-%Pred-Pre: 87 %
FEV1-Post: 3.17 L
FEV1-Pre: 3.14 L
FEV1FVC-%Change-Post: 0 %
FEV1FVC-%Pred-Pre: 104 %
FEV6-%Change-Post: 1 %
FEV6-%PRED-PRE: 86 %
FEV6-%Pred-Post: 88 %
FEV6-Post: 3.92 L
FEV6-Pre: 3.86 L
FEV6FVC-%PRED-PRE: 103 %
FEV6FVC-%Pred-Post: 103 %
FVC-%CHANGE-POST: 1 %
FVC-%PRED-PRE: 83 %
FVC-%Pred-Post: 84 %
FVC-POST: 3.92 L
Post FEV1/FVC ratio: 81 %
Post FEV6/FVC ratio: 100 %
Pre FEV1/FVC ratio: 81 %
Pre FEV6/FVC Ratio: 100 %
RV % PRED: 127 %
RV: 2.35 L
TLC % PRED: 102 %
TLC: 6.49 L

## 2014-09-18 MED ORDER — ALBUTEROL SULFATE (2.5 MG/3ML) 0.083% IN NEBU
2.5000 mg | INHALATION_SOLUTION | Freq: Once | RESPIRATORY_TRACT | Status: AC
  Administered 2014-09-18: 2.5 mg via RESPIRATORY_TRACT

## 2014-09-28 NOTE — Sleep Study (Signed)
  [place] A. Merlene Laughter, MD     [email or web address]        NOCTURNAL POLYSOMNOGRAM    LOCATION: SLEEP LAB FACILITY: [facility]   PHYSICIAN: Mylik Pro A. Merlene Laughter, M.D.   DATE OF STUDY: 09/17/2014.   REFERRING PHYSICIAN: Sinda Du.   INDICATIONS: The patient is a 49 year old presents with snoring, fatigue, hypersomnia and witnessed apneas.  MEDICATIONS:  Prior to Admission medications   Medication Sig Start Date End Date Taking? Authorizing Provider  albuterol (PROVENTIL HFA;VENTOLIN HFA) 108 (90 BASE) MCG/ACT inhaler Inhale 2 puffs into the lungs every 4 (four) hours as needed for wheezing. 03/27/13   Janice Norrie, MD  amoxicillin (AMOXIL) 500 MG capsule Take 1 capsule (500 mg total) by mouth 3 (three) times daily. 04/08/14   Maudry Diego, MD  HYDROcodone-acetaminophen (NORCO) 10-325 MG per tablet Take 1-2 tablets by mouth every 4 (four) hours as needed for pain.    Historical Provider, MD  lisinopril (PRINIVIL,ZESTRIL) 10 MG tablet Take 10 mg by mouth daily. 03/21/14   Historical Provider, MD  morphine (KADIAN) 50 MG 24 hr capsule Take 50 mg by mouth 2 (two) times daily.    Historical Provider, MD  promethazine (PHENERGAN) 25 MG tablet Take 1 tablet (25 mg total) by mouth every 6 (six) hours as needed for nausea or vomiting. 04/08/14   Maudry Diego, MD  simvastatin (ZOCOR) 20 MG tablet Take 20 mg by mouth daily. 03/21/14   Historical Provider, MD  traZODone (DESYREL) 100 MG tablet Take 1 tablet by mouth at bedtime. 10/03/13   Historical Provider, MD      EPWORTH SLEEPINESS SCALE: 11.   BMI: 33.   ARCHITECTURAL SUMMARY: The study was split with the initial portion being a diagnostic and the second portion the titration recording. Total recording time was 391 minutes. Sleep efficiency 74 %. Sleep latency 39 minutes. REM latency 166 minutes. Stage NI 9 %, N2 63 % and N3 1 % and REM sleep 27 %.    RESPIRATORY DATA:  Baseline oxygen saturation is 95 %. The lowest  saturation is 86 %. The diagnostic AHI is 28. The patient was placed on positive pressure starting at 4 and increase of 14. The Optimal pressure is 14.   LIMB MOVEMENT SUMMARY: PLM index 0.   ELECTROCARDIOGRAM SUMMARY: Average heart rate is 75 with no significant dysrhythmias observed.   IMPRESSION:  1. Moderate obstructive sleep apnea syndrome which responds well to a CPAP of 14. 2. Abnormal sleep architecture with increase stage II non-REM sleep and reduced slow-wave sleep.  Thanks for this referral.  Natilie Krabbenhoft A. Merlene Laughter, M.D. Diplomat, Tax adviser of Sleep Medicine.

## 2014-10-01 DIAGNOSIS — J449 Chronic obstructive pulmonary disease, unspecified: Secondary | ICD-10-CM | POA: Diagnosis not present

## 2014-10-01 DIAGNOSIS — I10 Essential (primary) hypertension: Secondary | ICD-10-CM | POA: Diagnosis not present

## 2014-10-01 DIAGNOSIS — G4730 Sleep apnea, unspecified: Secondary | ICD-10-CM | POA: Diagnosis not present

## 2014-10-02 DIAGNOSIS — R0789 Other chest pain: Secondary | ICD-10-CM | POA: Diagnosis not present

## 2014-10-02 DIAGNOSIS — R079 Chest pain, unspecified: Secondary | ICD-10-CM | POA: Diagnosis not present

## 2014-10-02 DIAGNOSIS — I10 Essential (primary) hypertension: Secondary | ICD-10-CM | POA: Diagnosis not present

## 2014-10-02 DIAGNOSIS — M13861 Other specified arthritis, right knee: Secondary | ICD-10-CM | POA: Diagnosis not present

## 2014-10-02 DIAGNOSIS — Z79891 Long term (current) use of opiate analgesic: Secondary | ICD-10-CM | POA: Diagnosis not present

## 2014-10-02 DIAGNOSIS — Z8241 Family history of sudden cardiac death: Secondary | ICD-10-CM | POA: Diagnosis not present

## 2014-10-02 DIAGNOSIS — Z7951 Long term (current) use of inhaled steroids: Secondary | ICD-10-CM | POA: Diagnosis not present

## 2014-10-02 DIAGNOSIS — E785 Hyperlipidemia, unspecified: Secondary | ICD-10-CM | POA: Diagnosis not present

## 2014-10-02 DIAGNOSIS — M13862 Other specified arthritis, left knee: Secondary | ICD-10-CM | POA: Diagnosis not present

## 2014-10-03 DIAGNOSIS — Z79891 Long term (current) use of opiate analgesic: Secondary | ICD-10-CM | POA: Diagnosis not present

## 2014-10-03 DIAGNOSIS — Z7951 Long term (current) use of inhaled steroids: Secondary | ICD-10-CM | POA: Diagnosis not present

## 2014-10-03 DIAGNOSIS — R079 Chest pain, unspecified: Secondary | ICD-10-CM | POA: Diagnosis not present

## 2014-10-03 DIAGNOSIS — E785 Hyperlipidemia, unspecified: Secondary | ICD-10-CM | POA: Diagnosis not present

## 2014-10-03 DIAGNOSIS — M13861 Other specified arthritis, right knee: Secondary | ICD-10-CM | POA: Diagnosis not present

## 2014-10-03 DIAGNOSIS — R0789 Other chest pain: Secondary | ICD-10-CM | POA: Diagnosis not present

## 2014-10-03 DIAGNOSIS — M13862 Other specified arthritis, left knee: Secondary | ICD-10-CM | POA: Diagnosis not present

## 2014-10-03 DIAGNOSIS — I10 Essential (primary) hypertension: Secondary | ICD-10-CM | POA: Diagnosis not present

## 2014-10-03 DIAGNOSIS — Z8241 Family history of sudden cardiac death: Secondary | ICD-10-CM | POA: Diagnosis not present

## 2014-10-07 DIAGNOSIS — R079 Chest pain, unspecified: Secondary | ICD-10-CM | POA: Diagnosis not present

## 2014-10-08 DIAGNOSIS — R079 Chest pain, unspecified: Secondary | ICD-10-CM | POA: Diagnosis not present

## 2014-10-09 DIAGNOSIS — S32009A Unspecified fracture of unspecified lumbar vertebra, initial encounter for closed fracture: Secondary | ICD-10-CM | POA: Diagnosis not present

## 2014-10-09 DIAGNOSIS — R52 Pain, unspecified: Secondary | ICD-10-CM | POA: Diagnosis not present

## 2014-10-09 DIAGNOSIS — G039 Meningitis, unspecified: Secondary | ICD-10-CM | POA: Diagnosis not present

## 2014-10-14 DIAGNOSIS — G4733 Obstructive sleep apnea (adult) (pediatric): Secondary | ICD-10-CM | POA: Diagnosis not present

## 2014-10-31 DIAGNOSIS — R0789 Other chest pain: Secondary | ICD-10-CM | POA: Diagnosis not present

## 2014-11-01 DIAGNOSIS — J218 Acute bronchiolitis due to other specified organisms: Secondary | ICD-10-CM | POA: Diagnosis not present

## 2014-11-01 DIAGNOSIS — M549 Dorsalgia, unspecified: Secondary | ICD-10-CM | POA: Diagnosis not present

## 2014-11-01 DIAGNOSIS — I10 Essential (primary) hypertension: Secondary | ICD-10-CM | POA: Diagnosis not present

## 2014-11-01 DIAGNOSIS — E785 Hyperlipidemia, unspecified: Secondary | ICD-10-CM | POA: Diagnosis not present

## 2014-11-01 DIAGNOSIS — R739 Hyperglycemia, unspecified: Secondary | ICD-10-CM | POA: Diagnosis not present

## 2014-11-01 DIAGNOSIS — E663 Overweight: Secondary | ICD-10-CM | POA: Diagnosis not present

## 2014-11-14 DIAGNOSIS — G4733 Obstructive sleep apnea (adult) (pediatric): Secondary | ICD-10-CM | POA: Diagnosis not present

## 2014-11-28 DIAGNOSIS — R0789 Other chest pain: Secondary | ICD-10-CM | POA: Diagnosis not present

## 2014-12-02 DIAGNOSIS — G4730 Sleep apnea, unspecified: Secondary | ICD-10-CM | POA: Diagnosis not present

## 2014-12-02 DIAGNOSIS — I10 Essential (primary) hypertension: Secondary | ICD-10-CM | POA: Diagnosis not present

## 2014-12-02 DIAGNOSIS — J449 Chronic obstructive pulmonary disease, unspecified: Secondary | ICD-10-CM | POA: Diagnosis not present

## 2014-12-14 DIAGNOSIS — G4733 Obstructive sleep apnea (adult) (pediatric): Secondary | ICD-10-CM | POA: Diagnosis not present

## 2014-12-23 DIAGNOSIS — F1020 Alcohol dependence, uncomplicated: Secondary | ICD-10-CM | POA: Diagnosis not present

## 2014-12-25 DIAGNOSIS — F341 Dysthymic disorder: Secondary | ICD-10-CM | POA: Diagnosis not present

## 2014-12-25 DIAGNOSIS — J42 Unspecified chronic bronchitis: Secondary | ICD-10-CM | POA: Diagnosis not present

## 2014-12-25 DIAGNOSIS — E785 Hyperlipidemia, unspecified: Secondary | ICD-10-CM | POA: Diagnosis not present

## 2014-12-25 DIAGNOSIS — I10 Essential (primary) hypertension: Secondary | ICD-10-CM | POA: Diagnosis not present

## 2015-01-14 DIAGNOSIS — G4733 Obstructive sleep apnea (adult) (pediatric): Secondary | ICD-10-CM | POA: Diagnosis not present

## 2015-01-21 DIAGNOSIS — S32009A Unspecified fracture of unspecified lumbar vertebra, initial encounter for closed fracture: Secondary | ICD-10-CM | POA: Diagnosis not present

## 2015-01-21 DIAGNOSIS — G039 Meningitis, unspecified: Secondary | ICD-10-CM | POA: Diagnosis not present

## 2015-01-29 ENCOUNTER — Emergency Department (HOSPITAL_COMMUNITY)
Admission: EM | Admit: 2015-01-29 | Discharge: 2015-01-29 | Discharge status: Against Medical Advice | Payer: Commercial Managed Care - HMO | Attending: Emergency Medicine | Admitting: Emergency Medicine

## 2015-01-29 ENCOUNTER — Encounter (HOSPITAL_COMMUNITY): Payer: Self-pay | Admitting: Emergency Medicine

## 2015-01-29 ENCOUNTER — Emergency Department (HOSPITAL_COMMUNITY)
Admission: EM | Admit: 2015-01-29 | Discharge: 2015-01-29 | Disposition: A | Discharge status: Home / Self Care | Payer: Commercial Managed Care - HMO | Attending: Emergency Medicine | Admitting: Emergency Medicine

## 2015-01-29 ENCOUNTER — Emergency Department (HOSPITAL_COMMUNITY): Payer: Commercial Managed Care - HMO

## 2015-01-29 ENCOUNTER — Encounter (HOSPITAL_COMMUNITY): Payer: Self-pay

## 2015-01-29 DIAGNOSIS — R0600 Dyspnea, unspecified: Secondary | ICD-10-CM | POA: Insufficient documentation

## 2015-01-29 DIAGNOSIS — H578 Other specified disorders of eye and adnexa: Secondary | ICD-10-CM | POA: Diagnosis not present

## 2015-01-29 DIAGNOSIS — Z792 Long term (current) use of antibiotics: Secondary | ICD-10-CM | POA: Diagnosis not present

## 2015-01-29 DIAGNOSIS — F329 Major depressive disorder, single episode, unspecified: Secondary | ICD-10-CM | POA: Insufficient documentation

## 2015-01-29 DIAGNOSIS — Z8781 Personal history of (healed) traumatic fracture: Secondary | ICD-10-CM | POA: Diagnosis not present

## 2015-01-29 DIAGNOSIS — R079 Chest pain, unspecified: Secondary | ICD-10-CM | POA: Insufficient documentation

## 2015-01-29 DIAGNOSIS — Z87891 Personal history of nicotine dependence: Secondary | ICD-10-CM | POA: Insufficient documentation

## 2015-01-29 DIAGNOSIS — F419 Anxiety disorder, unspecified: Secondary | ICD-10-CM | POA: Diagnosis not present

## 2015-01-29 DIAGNOSIS — Z79899 Other long term (current) drug therapy: Secondary | ICD-10-CM | POA: Diagnosis not present

## 2015-01-29 DIAGNOSIS — F319 Bipolar disorder, unspecified: Secondary | ICD-10-CM | POA: Diagnosis not present

## 2015-01-29 DIAGNOSIS — G4730 Sleep apnea, unspecified: Secondary | ICD-10-CM | POA: Diagnosis not present

## 2015-01-29 DIAGNOSIS — Z8669 Personal history of other diseases of the nervous system and sense organs: Secondary | ICD-10-CM | POA: Diagnosis not present

## 2015-01-29 DIAGNOSIS — R0981 Nasal congestion: Secondary | ICD-10-CM | POA: Diagnosis not present

## 2015-01-29 DIAGNOSIS — Z9981 Dependence on supplemental oxygen: Secondary | ICD-10-CM | POA: Diagnosis not present

## 2015-01-29 DIAGNOSIS — R0602 Shortness of breath: Secondary | ICD-10-CM | POA: Diagnosis not present

## 2015-01-29 DIAGNOSIS — Z87828 Personal history of other (healed) physical injury and trauma: Secondary | ICD-10-CM | POA: Diagnosis not present

## 2015-01-29 DIAGNOSIS — R05 Cough: Secondary | ICD-10-CM | POA: Diagnosis not present

## 2015-01-29 DIAGNOSIS — R110 Nausea: Secondary | ICD-10-CM | POA: Diagnosis not present

## 2015-01-29 DIAGNOSIS — M7989 Other specified soft tissue disorders: Secondary | ICD-10-CM | POA: Diagnosis not present

## 2015-01-29 DIAGNOSIS — R059 Cough, unspecified: Secondary | ICD-10-CM
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
## 2015-01-29 NOTE — Discharge Instructions (Signed)
Please return if any changes or problems. Shortness of Breath Shortness of breath means you have trouble breathing. Shortness of breath needs medical care right away. HOME CARE   Do not smoke.  Avoid being around chemicals or things (paint fumes, dust) that may bother your breathing.  Rest as needed. Slowly begin your normal activities.  Only take medicines as told by your doctor.  Keep all doctor visits as told. GET HELP RIGHT AWAY IF:   Your shortness of breath gets worse.  You feel lightheaded, pass out (faint), or have a cough that is not helped by medicine.  You cough up blood.  You have pain with breathing.  You have pain in your chest, arms, shoulders, or belly (abdomen).  You have a fever.  You cannot walk up stairs or exercise the way you normally do.  You do not get better in the time expected.  You have a hard time doing normal activities even with rest.  You have problems with your medicines.  You have any new symptoms. MAKE SURE YOU:  Understand these instructions.  Will watch your condition.  Will get help right away if you are not doing well or get worse. Document Released: 01/05/2008 Document Revised: 07/24/2013 Document Reviewed: 10/04/2011 [facility] Patient Information 2015 [place], [place]. This information is not intended to replace advice given to you by your health care provider. Make sure you discuss any questions you have with your health care provider.

## 2015-01-29 NOTE — ED Notes (Signed)
Pt c/o worsening symptoms of bronchitis with nausea. Pt seen in ED this am, left AMA.

## 2015-01-29 NOTE — ED Notes (Signed)
Pt reports recently had bronchitis, today c/o feeling SOB, eyes burning, and felt confused.

## 2015-01-29 NOTE — ED Notes (Signed)
Pt told [name] [name], [place], that he is feeling better and no longer wants to be treated in the ED.  PA explained to pt that he has the right to a medical screening and made pt aware of risks of leaving without treatment.  Pt says feels better and says he will go to his PCP.

## 2015-01-29 NOTE — Discharge Instructions (Signed)
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
## 2015-02-13 DIAGNOSIS — G4733 Obstructive sleep apnea (adult) (pediatric): Secondary | ICD-10-CM | POA: Diagnosis not present

## 2015-03-11 ENCOUNTER — Inpatient Hospital Stay (HOSPITAL_COMMUNITY)
Admission: AD | Admit: 2015-03-11 | Discharge: 2015-03-17 | DRG: 885 | Disposition: A | Discharge status: Home / Self Care | Payer: Commercial Managed Care - HMO | Source: Intra-hospital | Attending: Psychiatry | Admitting: Psychiatry

## 2015-03-11 ENCOUNTER — Encounter (HOSPITAL_COMMUNITY): Payer: Self-pay | Admitting: *Deleted

## 2015-03-11 ENCOUNTER — Emergency Department (HOSPITAL_COMMUNITY)
Admission: EM | Admit: 2015-03-11 | Discharge: 2015-03-11 | Disposition: A | Discharge status: Other Acute Inpatient Hospital | Payer: Commercial Managed Care - HMO | Attending: Emergency Medicine | Admitting: Emergency Medicine

## 2015-03-11 ENCOUNTER — Emergency Department (HOSPITAL_COMMUNITY): Payer: Commercial Managed Care - HMO

## 2015-03-11 DIAGNOSIS — F329 Major depressive disorder, single episode, unspecified: Secondary | ICD-10-CM | POA: Diagnosis not present

## 2015-03-11 DIAGNOSIS — F3162 Bipolar disorder, current episode mixed, moderate: Secondary | ICD-10-CM | POA: Diagnosis not present

## 2015-03-11 DIAGNOSIS — R440 Auditory hallucinations: Secondary | ICD-10-CM | POA: Diagnosis not present

## 2015-03-11 DIAGNOSIS — Z8669 Personal history of other diseases of the nervous system and sense organs: Secondary | ICD-10-CM | POA: Insufficient documentation

## 2015-03-11 DIAGNOSIS — G8929 Other chronic pain: Secondary | ICD-10-CM | POA: Diagnosis present

## 2015-03-11 DIAGNOSIS — F1120 Opioid dependence, uncomplicated: Secondary | ICD-10-CM | POA: Diagnosis not present

## 2015-03-11 DIAGNOSIS — F1110 Opioid abuse, uncomplicated: Secondary | ICD-10-CM | POA: Insufficient documentation

## 2015-03-11 DIAGNOSIS — G4733 Obstructive sleep apnea (adult) (pediatric): Secondary | ICD-10-CM | POA: Diagnosis not present

## 2015-03-11 DIAGNOSIS — F3160 Bipolar disorder, current episode mixed, unspecified: Secondary | ICD-10-CM | POA: Diagnosis present

## 2015-03-11 DIAGNOSIS — R05 Cough: Secondary | ICD-10-CM | POA: Diagnosis not present

## 2015-03-11 DIAGNOSIS — F22 Delusional disorders: Secondary | ICD-10-CM | POA: Diagnosis not present

## 2015-03-11 DIAGNOSIS — Z8781 Personal history of (healed) traumatic fracture: Secondary | ICD-10-CM | POA: Diagnosis not present

## 2015-03-11 DIAGNOSIS — R0789 Other chest pain: Secondary | ICD-10-CM | POA: Insufficient documentation

## 2015-03-11 DIAGNOSIS — E221 Hyperprolactinemia: Secondary | ICD-10-CM | POA: Diagnosis not present

## 2015-03-11 DIAGNOSIS — F1490 Cocaine use, unspecified, uncomplicated: Secondary | ICD-10-CM | POA: Diagnosis not present

## 2015-03-11 DIAGNOSIS — Z87828 Personal history of other (healed) physical injury and trauma: Secondary | ICD-10-CM | POA: Insufficient documentation

## 2015-03-11 DIAGNOSIS — F1421 Cocaine dependence, in remission: Secondary | ICD-10-CM | POA: Diagnosis present

## 2015-03-11 DIAGNOSIS — Z79899 Other long term (current) drug therapy: Secondary | ICD-10-CM | POA: Diagnosis not present

## 2015-03-11 DIAGNOSIS — R079 Chest pain, unspecified: Secondary | ICD-10-CM | POA: Diagnosis not present

## 2015-03-11 DIAGNOSIS — Z87891 Personal history of nicotine dependence: Secondary | ICD-10-CM

## 2015-03-11 DIAGNOSIS — F29 Unspecified psychosis not due to a substance or known physiological condition: Secondary | ICD-10-CM | POA: Diagnosis present

## 2015-03-11 LAB — CBC WITH DIFFERENTIAL/PLATELET
Basophils Absolute: 0 10*3/uL (ref 0.0–0.1)
Basophils Relative: 0 % (ref 0–1)
Eosinophils Absolute: 0.2 10*3/uL (ref 0.0–0.7)
Eosinophils Relative: 2 % (ref 0–5)
HEMATOCRIT: 40.2 % (ref 39.0–52.0)
Hemoglobin: 14.4 g/dL (ref 13.0–17.0)
Lymphocytes Relative: 38 % (ref 12–46)
Lymphs Abs: 3.5 10*3/uL (ref 0.7–4.0)
MCH: 30.6 pg (ref 26.0–34.0)
MCHC: 35.8 g/dL (ref 30.0–36.0)
MCV: 85.4 fL (ref 78.0–100.0)
MONO ABS: 0.8 10*3/uL (ref 0.1–1.0)
MONOS PCT: 9 % (ref 3–12)
NEUTROS ABS: 4.8 10*3/uL (ref 1.7–7.7)
Neutrophils Relative %: 52 % (ref 43–77)
Platelets: 245 10*3/uL (ref 150–400)
RBC: 4.71 MIL/uL (ref 4.22–5.81)
RDW: 11.8 % (ref 11.5–15.5)
WBC: 9.2 10*3/uL (ref 4.0–10.5)

## 2015-03-11 LAB — COMPREHENSIVE METABOLIC PANEL
ALBUMIN: 4.2 g/dL (ref 3.5–5.0)
ALT: 19 U/L (ref 17–63)
AST: 16 U/L (ref 15–41)
Alkaline Phosphatase: 60 U/L (ref 38–126)
Anion gap: 10 (ref 5–15)
BUN: 15 mg/dL (ref 6–20)
CALCIUM: 8.7 mg/dL — AB (ref 8.9–10.3)
CO2: 23 mmol/L (ref 22–32)
Chloride: 104 mmol/L (ref 101–111)
Creatinine, Ser: 0.9 mg/dL (ref 0.61–1.24)
GFR calc Af Amer: >60 mL/min (ref >60)
Glucose, Bld: 111 mg/dL — ABNORMAL HIGH (ref 65–99)
POTASSIUM: 3.4 mmol/L — AB (ref 3.5–5.1)
Sodium: 137 mmol/L (ref 135–145)
TOTAL PROTEIN: 6.9 g/dL (ref 6.5–8.1)
Total Bilirubin: 0.4 mg/dL (ref 0.3–1.2)

## 2015-03-11 LAB — RAPID URINE DRUG SCREEN, HOSP PERFORMED
Amphetamines: NOT DETECTED
BENZODIAZEPINES: NOT DETECTED
Barbiturates: NOT DETECTED
Cocaine: NOT DETECTED
Opiates: POSITIVE — AB
Tetrahydrocannabinol: NOT DETECTED

## 2015-03-11 LAB — TROPONIN I: Troponin I: <0.03 ng/mL (ref <0.031)

## 2015-03-11 LAB — ETHANOL: Alcohol, Ethyl (B): <5 mg/dL (ref <5)

## 2015-03-11 MED ORDER — LISINOPRIL 10 MG PO TABS
10.0000 mg | ORAL_TABLET | Freq: Every day | ORAL | Status: DC
  Administered 2015-03-11 – 2015-03-16 (×6): 10 mg via ORAL
  Filled 2015-03-11 (×2): qty 1
  Filled 2015-03-11: qty 2
  Filled 2015-03-11 (×3): qty 1
  Filled 2015-03-11: qty 2
  Filled 2015-03-11 (×4): qty 1
  Filled 2015-03-11: qty 2

## 2015-03-11 MED ORDER — MORPHINE SULFATE ER 50 MG PO CP24: 50.0000 mg | ORAL_CAPSULE | Freq: Two times a day (BID) | ORAL | Status: DC

## 2015-03-11 MED ORDER — IPRATROPIUM-ALBUTEROL 0.5-2.5 (3) MG/3ML IN SOLN
3.0000 mL | Freq: Once | RESPIRATORY_TRACT | Status: AC
  Administered 2015-03-11: 3 mL via RESPIRATORY_TRACT
  Filled 2015-03-11: qty 3

## 2015-03-11 MED ORDER — ONDANSETRON HCL 4 MG PO TABS: 4.0000 mg | ORAL_TABLET | Freq: Three times a day (TID) | ORAL | Status: DC | PRN

## 2015-03-11 MED ORDER — HYDROCODONE-ACETAMINOPHEN 10-325 MG PO TABS: 1.0000 | ORAL_TABLET | ORAL | Status: DC | PRN

## 2015-03-11 MED ORDER — MORPHINE SULFATE ER 15 MG PO TBCR
45.0000 mg | EXTENDED_RELEASE_TABLET | Freq: Two times a day (BID) | ORAL | Status: DC
  Administered 2015-03-11: 45 mg via ORAL
  Filled 2015-03-11: qty 3

## 2015-03-11 MED ORDER — ALBUTEROL SULFATE HFA 108 (90 BASE) MCG/ACT IN AERS
2.0000 | INHALATION_SPRAY | RESPIRATORY_TRACT | Status: DC | PRN
  Administered 2015-03-11 – 2015-03-16 (×12): 2 via RESPIRATORY_TRACT
  Filled 2015-03-11 (×2): qty 6.7

## 2015-03-11 MED ORDER — ACETAMINOPHEN 325 MG PO TABS
650.0000 mg | ORAL_TABLET | Freq: Four times a day (QID) | ORAL | Status: DC | PRN
  Administered 2015-03-12: 650 mg via ORAL
  Filled 2015-03-11: qty 2

## 2015-03-11 MED ORDER — UMECLIDINIUM-VILANTEROL 62.5-25 MCG/INH IN AEPB
1.0000 | INHALATION_SPRAY | Freq: Every day | RESPIRATORY_TRACT | Status: DC
  Administered 2015-03-11: 1 via RESPIRATORY_TRACT

## 2015-03-11 MED ORDER — ACETAMINOPHEN 325 MG PO TABS: 650.0000 mg | ORAL_TABLET | ORAL | Status: DC | PRN

## 2015-03-11 MED ORDER — HYDROXYZINE HCL 25 MG PO TABS: 25.0000 mg | ORAL_TABLET | Freq: Four times a day (QID) | ORAL | Status: DC | PRN

## 2015-03-11 MED ORDER — UMECLIDINIUM-VILANTEROL 62.5-25 MCG/INH IN AEPB: 1.0000 | INHALATION_SPRAY | Freq: Every day | RESPIRATORY_TRACT | Status: DC

## 2015-03-11 MED ORDER — ALUM & MAG HYDROXIDE-SIMETH 200-200-20 MG/5ML PO SUSP
30.0000 mL | ORAL | Status: DC | PRN
Start: 2015-03-11 — End: 2015-03-11

## 2015-03-11 MED ORDER — IBUPROFEN 800 MG PO TABS
800.0000 mg | ORAL_TABLET | Freq: Four times a day (QID) | ORAL | Status: DC | PRN
  Administered 2015-03-11: 800 mg via ORAL
  Filled 2015-03-11: qty 1

## 2015-03-11 MED ORDER — IBUPROFEN 400 MG PO TABS: 200.0000 mg | ORAL_TABLET | Freq: Every evening | ORAL | Status: DC | PRN

## 2015-03-11 MED ORDER — GI COCKTAIL ~~LOC~~
30.0000 mL | Freq: Once | ORAL | Status: AC
  Administered 2015-03-11: 30 mL via ORAL
  Filled 2015-03-11: qty 30

## 2015-03-11 MED ORDER — LISINOPRIL 10 MG PO TABS
10.0000 mg | ORAL_TABLET | Freq: Every day | ORAL | Status: DC
  Administered 2015-03-11: 10 mg via ORAL
  Filled 2015-03-11: qty 1

## 2015-03-11 MED ORDER — SIMVASTATIN 10 MG PO TABS
20.0000 mg | ORAL_TABLET | Freq: Every day | ORAL | Status: DC
  Administered 2015-03-11: 20 mg via ORAL
  Filled 2015-03-11: qty 2

## 2015-03-11 MED ORDER — ALBUTEROL SULFATE (2.5 MG/3ML) 0.083% IN NEBU: 5.0000 mg | INHALATION_SOLUTION | Freq: Once | RESPIRATORY_TRACT | Status: DC

## 2015-03-11 MED ORDER — IPRATROPIUM BROMIDE 0.02 % IN SOLN: 0.5000 mg | Freq: Once | RESPIRATORY_TRACT | Status: DC

## 2015-03-11 MED ORDER — TRAZODONE HCL 100 MG PO TABS
100.0000 mg | ORAL_TABLET | Freq: Every day | ORAL | Status: DC
  Administered 2015-03-11 – 2015-03-12 (×2): 100 mg via ORAL
  Filled 2015-03-11 (×6): qty 1

## 2015-03-11 MED ORDER — DIPHENHYDRAMINE HCL 25 MG PO CAPS: 25.0000 mg | ORAL_CAPSULE | Freq: Every evening | ORAL | Status: DC | PRN

## 2015-03-11 MED ORDER — DOCUSATE SODIUM 100 MG PO CAPS
200.0000 mg | ORAL_CAPSULE | Freq: Every day | ORAL | Status: DC
  Administered 2015-03-11: 200 mg via ORAL
  Filled 2015-03-11 (×2): qty 2

## 2015-03-11 MED ORDER — TRAZODONE HCL 50 MG PO TABS: 100.0000 mg | ORAL_TABLET | Freq: Every day | ORAL | Status: DC

## 2015-03-11 MED ORDER — SIMVASTATIN 20 MG PO TABS
20.0000 mg | ORAL_TABLET | Freq: Every day | ORAL | Status: DC
  Administered 2015-03-11 – 2015-03-17 (×7): 20 mg via ORAL
  Filled 2015-03-11 (×12): qty 1

## 2015-03-11 MED ORDER — UMECLIDINIUM-VILANTEROL 62.5-25 MCG/INH IN AEPB
1.0000 | INHALATION_SPRAY | Freq: Every day | RESPIRATORY_TRACT | Status: DC
  Administered 2015-03-12 – 2015-03-17 (×6): 1 via RESPIRATORY_TRACT

## 2015-03-11 MED ORDER — ALBUTEROL SULFATE (2.5 MG/3ML) 0.083% IN NEBU
2.5000 mg | INHALATION_SOLUTION | Freq: Once | RESPIRATORY_TRACT | Status: AC
  Administered 2015-03-11: 2.5 mg via RESPIRATORY_TRACT
  Filled 2015-03-11: qty 3

## 2015-03-11 MED ORDER — DOCUSATE SODIUM 100 MG PO CAPS
200.0000 mg | ORAL_CAPSULE | Freq: Every day | ORAL | Status: DC
  Administered 2015-03-11 – 2015-03-17 (×7): 200 mg via ORAL
  Filled 2015-03-11 (×12): qty 2

## 2015-03-11 MED ORDER — IBUPROFEN 400 MG PO TABS: 600.0000 mg | ORAL_TABLET | Freq: Three times a day (TID) | ORAL | Status: DC | PRN

## 2015-03-11 MED ORDER — PANTOPRAZOLE SODIUM 40 MG PO TBEC
40.0000 mg | DELAYED_RELEASE_TABLET | Freq: Every day | ORAL | Status: DC
  Administered 2015-03-11 – 2015-03-17 (×7): 40 mg via ORAL
  Filled 2015-03-11 (×11): qty 1

## 2015-03-11 MED ORDER — NICOTINE 21 MG/24HR TD PT24
21.0000 mg | MEDICATED_PATCH | Freq: Every day | TRANSDERMAL | Status: DC
  Filled 2015-03-11 (×3): qty 1

## 2015-03-11 MED ORDER — LORAZEPAM 1 MG PO TABS: 1.0000 mg | ORAL_TABLET | Freq: Three times a day (TID) | ORAL | Status: DC | PRN

## 2015-03-11 MED ORDER — NICOTINE 21 MG/24HR TD PT24
21.0000 mg | MEDICATED_PATCH | Freq: Every day | TRANSDERMAL | Status: DC
  Filled 2015-03-11: qty 1

## 2015-03-11 MED ORDER — ZOLPIDEM TARTRATE 5 MG PO TABS: 10.0000 mg | ORAL_TABLET | Freq: Every evening | ORAL | Status: DC | PRN

## 2015-03-11 MED ORDER — PANTOPRAZOLE SODIUM 40 MG PO TBEC
40.0000 mg | DELAYED_RELEASE_TABLET | Freq: Every day | ORAL | Status: DC
  Administered 2015-03-11: 40 mg via ORAL
  Filled 2015-03-11: qty 1

## 2015-03-11 MED ORDER — ALBUTEROL SULFATE HFA 108 (90 BASE) MCG/ACT IN AERS: 2.0000 | INHALATION_SPRAY | RESPIRATORY_TRACT | Status: DC | PRN

## 2015-03-11 MED ORDER — IBUPROFEN-DIPHENHYDRAMINE HCL 200-25 MG PO CAPS: 1.0000 | ORAL_CAPSULE | Freq: Every evening | ORAL | Status: DC | PRN

## 2015-03-11 MED ORDER — ALUM & MAG HYDROXIDE-SIMETH 200-200-20 MG/5ML PO SUSP: 30.0000 mL | ORAL | Status: DC | PRN

## 2015-03-11 MED ORDER — MAGNESIUM HYDROXIDE 400 MG/5ML PO SUSP
30.0000 mL | Freq: Every day | ORAL | Status: DC | PRN
  Administered 2015-03-14: 30 mL via ORAL
  Filled 2015-03-11: qty 30

## 2015-03-11 MED ORDER — FAMOTIDINE 20 MG PO TABS
20.0000 mg | ORAL_TABLET | Freq: Once | ORAL | Status: AC
  Administered 2015-03-11: 20 mg via ORAL
  Filled 2015-03-11: qty 1
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
## 2015-03-11 NOTE — Progress Notes (Signed)
Seeking inpatient psych placement. Also considered for admission to [facility] upon bed availability.  Referred to: [facility]- per [facility]- per [facility] for waitlist [facility]- per [facility]- per [facility]- per [facility]- per [facility]  Left voicemails for [facility] and will refer if appropriate

## 2015-03-11 NOTE — ED Notes (Signed)
Pt given belongings, money and medication removed and given to male visitor to take home.
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
## 2015-03-11 NOTE — ED Notes (Signed)
Called [facility] for transport to [facility].  They were informed that the bed wiil be ready at 2pm.
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
## 2015-03-11 NOTE — ED Notes (Signed)
Spoke with [name] [name] at [facility] informed that bed is ready and will be available @ 2pm. Pt made aware and consent signed. Faxed to [facility]

## 2015-03-11 NOTE — ED Notes (Signed)
Patient placed on cardiac monitor, EKG done and given to EDP

## 2015-03-11 NOTE — Progress Notes (Signed)
Per Otila Kluver, [place], pt accepted to [facility] bed 502-1 by Dr. Shea Evans. Bed will be ready at 2pm. Admission is voluntary. Spoke with [facility] RN re: pt's placement.  Sharren Bridge, MSW, LCSW Clinical Social Work, Disposition  03/11/2015 [phone]

## 2015-03-11 NOTE — ED Notes (Signed)
KADIAN NOT GIVEN AT THIS TIME.  Pharmacy states, "We use [facility] for Kadian. However waiting for MR to clarify if actually taking Kadian twice daily since it is a 24 hour product."

## 2015-03-11 NOTE — Progress Notes (Signed)
Did not attend group 

## 2015-03-11 NOTE — ED Notes (Signed)
Patient wanded by security. 
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
## 2015-03-12 ENCOUNTER — Encounter (HOSPITAL_COMMUNITY): Payer: Self-pay | Admitting: Psychiatry

## 2015-03-12 DIAGNOSIS — F1120 Opioid dependence, uncomplicated: Secondary | ICD-10-CM | POA: Diagnosis present

## 2015-03-12 DIAGNOSIS — F3162 Bipolar disorder, current episode mixed, moderate: Principal | ICD-10-CM

## 2015-03-12 DIAGNOSIS — F1421 Cocaine dependence, in remission: Secondary | ICD-10-CM | POA: Diagnosis present

## 2015-03-12 DIAGNOSIS — F1490 Cocaine use, unspecified, uncomplicated: Secondary | ICD-10-CM

## 2015-03-12 MED ORDER — BENZTROPINE MESYLATE 0.5 MG PO TABS
0.5000 mg | ORAL_TABLET | Freq: Every day | ORAL | Status: DC
  Administered 2015-03-12 – 2015-03-16 (×5): 0.5 mg via ORAL
  Filled 2015-03-12: qty 14
  Filled 2015-03-12 (×7): qty 1

## 2015-03-12 MED ORDER — ONDANSETRON HCL 4 MG PO TABS
8.0000 mg | ORAL_TABLET | Freq: Three times a day (TID) | ORAL | Status: DC | PRN
  Administered 2015-03-12 – 2015-03-15 (×2): 8 mg via ORAL
  Filled 2015-03-12 (×2): qty 2

## 2015-03-12 MED ORDER — DIVALPROEX SODIUM ER 250 MG PO TB24
750.0000 mg | ORAL_TABLET | Freq: Every day | ORAL | Status: DC
  Administered 2015-03-12 – 2015-03-16 (×5): 750 mg via ORAL
  Filled 2015-03-12 (×6): qty 3
  Filled 2015-03-12: qty 9
  Filled 2015-03-12: qty 3

## 2015-03-12 MED ORDER — HALOPERIDOL 5 MG PO TABS
5.0000 mg | ORAL_TABLET | Freq: Every day | ORAL | Status: DC
  Administered 2015-03-12 – 2015-03-16 (×5): 5 mg via ORAL
  Filled 2015-03-12 (×4): qty 1
  Filled 2015-03-12: qty 3
  Filled 2015-03-12 (×3): qty 1

## 2015-03-12 MED ORDER — MORPHINE SULFATE ER 15 MG PO TBCR
45.0000 mg | EXTENDED_RELEASE_TABLET | Freq: Two times a day (BID) | ORAL | Status: DC
  Administered 2015-03-12 – 2015-03-17 (×11): 45 mg via ORAL
  Filled 2015-03-12 (×11): qty 3

## 2015-03-12 MED ORDER — POTASSIUM CHLORIDE CRYS ER 10 MEQ PO TBCR
10.0000 meq | EXTENDED_RELEASE_TABLET | Freq: Once | ORAL | Status: AC
  Administered 2015-03-12: 10 meq via ORAL
  Filled 2015-03-12 (×2): qty 1

## 2015-03-12 MED ORDER — HYDROCODONE-ACETAMINOPHEN 10-325 MG PO TABS
1.0000 | ORAL_TABLET | Freq: Four times a day (QID) | ORAL | Status: DC | PRN
  Administered 2015-03-12 – 2015-03-17 (×9): 1 via ORAL
  Filled 2015-03-12 (×9): qty 1
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
## 2015-03-12 NOTE — Progress Notes (Signed)
Patient ID: [name] [name], male   DOB: 05-18-1966, 49 y.o.   MRN: [id number] Complained of chronic back pain of a 9. Norco given prn for moderate pain.

## 2015-03-12 NOTE — [facility] Suicide Risk Assessment (Signed)
[facility] Admission Suicide Risk Assessment   Nursing information obtained from:  Patient Demographic factors:  Male, Caucasian Current Mental Status:    Loss Factors:  Decline in physical health Historical Factors:    Risk Reduction Factors:  Religious beliefs about death Total Time spent with patient: 30 minutes Principal Problem: Bipolar 1 disorder, mixed, moderate Diagnosis:   Patient Active Problem List   Diagnosis Date Noted  . Bipolar 1 disorder, mixed, moderate [F31.62] 03/12/2015  . Acute renal failure [N17.9] 02/17/2012  . Rhabdomyolysis [M62.82] 02/13/2012  . Dehydration [E86.0] 02/13/2012  . Hyponatremia [E87.1] 02/13/2012  . OVERWEIGHT [E66.3] 01/24/2009  . ARACHNOIDITIS [G03.9] 09/27/2008  . MALAISE AND FATIGUE [R53.81, R53.83] 09/27/2008  . HYPERLIPIDEMIA [E78.5] 07/01/2008  . BIPOLAR AFFECTIVE DISORDER [F31.9] 07/01/2008  . MIGRAINE HEADACHE [G43.909] 07/01/2008  . GERD [K21.9] 07/01/2008  . ARTHRITIS [M12.9] 07/01/2008  . DEGENERATIVE DISC DISEASE [IMO0002] 07/01/2008  . LOW BACK PAIN, CHRONIC [M54.5] 07/01/2008     Continued Clinical Symptoms:  Alcohol Use Disorder Identification Test Final Score (AUDIT): 0 The "Alcohol Use Disorders Identification Test", Guidelines for Use in Primary Care, Second Edition.  World Pharmacologist [facility]). Score between 0-7:  no or low risk or alcohol related problems. Score between 8-15:  moderate risk of alcohol related problems. Score between 16-19:  high risk of alcohol related problems. Score 20 or above:  warrants further diagnostic evaluation for alcohol dependence and treatment.   CLINICAL FACTORS:   Previous Psychiatric Diagnoses and Treatments Medical Diagnoses and Treatments/Surgeries   Musculoskeletal: Strength & Muscle Tone: within normal limits Gait & Station: normal Patient leans: N/A  Psychiatric Specialty Exam: Physical Exam  ROS  Blood pressure 116/69, pulse 76, temperature 97.9 F (36.6 C),  temperature source Oral, resp. rate 20, height 5\' 4"  (1.626 m), weight 84.823 kg (187 lb), SpO2 98 %.Body mass index is 32.08 kg/(m^2).            Please see H&P.                                              COGNITIVE FEATURES THAT CONTRIBUTE TO RISK:  Closed-mindedness, Polarized thinking and Thought constriction (tunnel vision)    SUICIDE RISK:   Moderate:  Frequent suicidal ideation with limited intensity, and duration, some specificity in terms of plans, no associated intent, good self-control, limited dysphoria/symptomatology, some risk factors present, and identifiable protective factors, including available and accessible social support.  PLAN OF CARE: Please see H&P.   Medical Decision Making:  Review of Psycho-Social Stressors (1), Review or order clinical lab tests (1), Review of Last Therapy Session (1), Review of Medication Regimen & Side Effects (2) and Review of New Medication or Change in Dosage (2)  I certify that inpatient services furnished can reasonably be expected to improve the patient's condition.   Dexter Signor MD 03/12/2015, 3:24 PM

## 2015-03-12 NOTE — Progress Notes (Signed)
Patient ID: [name] [name], male   DOB: 04/23/1966, 49 y.o.   MRN: [id number] D-Awakened for am medicines which were late due to his sleeping in. He woke up pleasant and cooperative. He reports chronic back pain and requested Tylenol for it. He is waiting to speak with the Dr re medicine for his pain. He states he is here because he has been having auditory hallucinations, which is also the reason he is on probation, for A/H and thinking his neighbors property was his. He decline the Nicotine patch because he says he is not a smoker. A- Support offered monitored for safety medications as ordered. Currently not on any psychotropic meds until he sees the Dr today. R-No complaints at this time. Out on unit but not noted to be interacting with peers.

## 2015-03-12 NOTE — [facility] Group Notes (Signed)
[place] LCSW Group Therapy 03/12/2015 1:15 PM Type of Therapy:? Group Therapy Participation Level:? Active Participation Quality:? Attentive, Sharing and Supportive Affect:?  Appropriate Cognitive:? Alert and Oriented Insight:?Improving and Engaged Engagement in Therapy:? Developing/Improving and Engaged Modes of Intervention:? Activity, Clarification, Confrontation, Discussion, Education, Exploration, Limit-setting, Orientation, Problem-solving, Rapport Building, Art therapist, Socialization and Support Summary of Progress/Problems: Patient was attentive and engaged with speaker from [facility].? Patient was attentive to speaker while they shared their story of dealing with mental health and overcoming it.? Patient expressed interest in their programs and services and received information on their agency.? Patient processed ways they can relate to the speaker.?? Patient asked speaker about similar MHA services in the [place] area.

## 2015-03-12 NOTE — Tx Team (Signed)
Interdisciplinary Treatment Plan Update (Adult)  Date:  03/12/2015   Time Reviewed:  2:44 PM   Progress in Treatment: Attending groups: Yes. Participating in groups:  Yes. Taking medication as prescribed:  Yes. Tolerating medication:  Yes. Family/Significant othe contact made:  No Patient understands diagnosis:  Yes  As evidenced by seeking help with AH, paranoia Discussing patient identified problems/goals with staff:  Yes, see initial care plan. Medical problems stabilized or resolved:  Yes. Denies suicidal/homicidal ideation: Yes. Issues/concerns per patient self-inventory:  No. Other:  New problem(s) identified:  Discharge Plan or Barriers:  Return home, follow up outpt  Reason for Continuation of Hospitalization: Hallucinations Medication stabilization Other; describe Paranoia  Comments:  Patient  states that he is afraid of the people that live behind him. He states he started hearing voices on July 4 that are getting worse. He states there are 9 male voices and one male voice. They tell him that someone is going to kill him. He thinks it is the neighbors behind him. He denies that they have actually made threats to him. He states "I am afraid for my life". He states his next appointment with his psychiatrist isn't until January. He states "I need some peace".   Anti psychotic trial  Estimated length of stay: 3-5 days  New goal(s):  Review of initial/current patient goals per problem list:   Review of initial/current patient goals per problem list:  1. Goal(s): Patient will participate in aftercare plan   Met: Yes   Target date: 3-5 days post admission date   As evidenced by: Patient will participate within aftercare plan AEB aftercare provider and housing plan at discharge being identified.  03/12/2015: Pt plans to return home, follow up outpt.       2. Goal(s): Patient will demonstrate decreased signs of psychosis  * Met: No  * Target date: 3-5 days post  admission date  * As evidenced by: Patient will demonstrate decreased frequency of AVH or return to baseline function 03/12/2015 Pt c/o AH and paranoia today.          Attendees: Patient:  03/12/2015 2:44 PM   Family:   03/12/2015 2:44 PM   Physician:  Ursula Alert, MD 03/12/2015 2:44 PM   Nursing:   Patrecia Pace, RN 03/12/2015 2:44 PM   CSW:    Roque Lias, LCSW   03/12/2015 2:44 PM   Other:  03/12/2015 2:44 PM   Other:   03/12/2015 2:44 PM   Other:  Lars Pinks, Nurse CM 03/12/2015 2:44 PM   Other:  Lucinda Dell, [place] Sessions TCT 03/12/2015 2:44 PM   Other:  Norberto Sorenson, [place]  03/12/2015 2:44 PM   Other:  03/12/2015 2:44 PM   Other:  03/12/2015 2:44 PM   Other:  03/12/2015 2:44 PM   Other:  03/12/2015 2:44 PM   Other:  03/12/2015 2:44 PM   Other:   03/12/2015 2:44 PM    Scribe for Treatment Team:   Trish Mage, 03/12/2015 2:44 PM

## 2015-03-13 LAB — LIPID PANEL
CHOL/HDL RATIO: 4.6 ratio
CHOLESTEROL: 195 mg/dL (ref 0–200)
HDL: 42 mg/dL (ref >40)
LDL Cholesterol: 111 mg/dL — ABNORMAL HIGH (ref 0–99)
Triglycerides: 211 mg/dL — ABNORMAL HIGH (ref <150)
VLDL: 42 mg/dL — AB (ref 0–40)

## 2015-03-13 LAB — BASIC METABOLIC PANEL
Anion gap: 11 (ref 5–15)
BUN: 11 mg/dL (ref 6–20)
CO2: 24 mmol/L (ref 22–32)
Calcium: 9.5 mg/dL (ref 8.9–10.3)
Chloride: 104 mmol/L (ref 101–111)
Creatinine, Ser: 0.95 mg/dL (ref 0.61–1.24)
GFR calc Af Amer: >60 mL/min (ref >60)
GFR calc non Af Amer: >60 mL/min (ref >60)
GLUCOSE: 98 mg/dL (ref 65–99)
Potassium: 3.8 mmol/L (ref 3.5–5.1)
Sodium: 139 mmol/L (ref 135–145)

## 2015-03-13 LAB — TSH: TSH: 1.144 u[IU]/mL (ref 0.350–4.500)

## 2015-03-13 MED ORDER — TRAZODONE HCL 150 MG PO TABS
150.0000 mg | ORAL_TABLET | Freq: Every day | ORAL | Status: DC
  Administered 2015-03-13: 150 mg via ORAL
  Filled 2015-03-13 (×2): qty 1

## 2015-03-13 MED ORDER — TRAZODONE HCL 50 MG PO TABS: 50.0000 mg | ORAL_TABLET | Freq: Every evening | ORAL | Status: DC | PRN

## 2015-03-13 NOTE — [facility] Group Notes (Signed)
[facility] Group Notes:  (Nursing/MHT/Case Management/Adjunct)  Date:  03/13/2015  Time:  0915am  Type of Therapy:  Nurse Education  Participation Level:  Active  Participation Quality:  Appropriate and Attentive  Affect:  Blunted  Cognitive:  Alert and Appropriate  Insight:  Appropriate and Good  Engagement in Group:  Engaged  Modes of Intervention:  Discussion, Education and Support  Summary of Progress/Problems: Patient attended group, remained engaged, and responded appropriately when prompted.  Charlyne Quale A 03/13/2015, 1:58 PM

## 2015-03-13 NOTE — [facility] Group Notes (Signed)
[facility] Group Notes:  (Counselor/Nursing/MHT/Case Management/Adjunct)  03/13/2015 1:15PM  Type of Therapy:  Group Therapy  Participation Level:  Active  Participation Quality:  Appropriate  Affect:  Flat  Cognitive:  Oriented  Insight:  Improving  Engagement in Group:  Limited  Engagement in Therapy:  Limited  Modes of Intervention:  Discussion, Exploration and Socialization  Summary of Progress/Problems: The topic for group was balance in life.  Pt participated in the discussion about when their life was in balance and out of balance and how this feels.  Pt discussed ways to get back in balance and short term goals they can work on to get where they want to be. Sat quietly.  Remained for the entire time.  When asked directly, stated that he feels unbalanced today.  Initially had a hard time saying way, but finally said that with a med change, his mood his off, so that he feels a little down.  Was given positive feedback for coming to group.  Stated that when he is unbalanced, he needs to do something physical, like garden, or clean, or do the laundry.  "Anything that makes me feel productive.  Then I'm OK."   Trish Mage 03/13/2015 2:59 PM

## 2015-03-13 NOTE — Progress Notes (Signed)
D: Pt alert and oriented X4. Presents with flat affect and depressed mood. Denies SI, HI and AVH when assessed. Reported fair sleep due to dreaming not nightmares and which he attributes to recent medication changes, "I had some medication changes so I kind of expected it". Pt rated his depression 4/10, hopelessness 2/10 and anxiety 5/10 on self inventory sheet. Pt's goal for today is "get better" which he plans to achieve by being medication compliant.  A: All medications administered as per MD's orders including PRN Hydrocodone and Albuterol for c/o back pain and SOB (see emar). Support and availability offered. Pt encouraged to voice needs. Q 15 minutes checks done as ordered to maintained safety. R: Pt receptive to care. Remains medication compliant and is cooperative with unit routines. Denies adverse drug reactions when assessed. Safety maintained on and off unit thus far this shift.

## 2015-03-13 NOTE — Progress Notes (Signed)
Patient ID: [name] [name], male   DOB: 12-20-1965, 49 y.o.   MRN: [id number] D: Client visible on the unit, interacts appropriately with staff and peers. Client reports "no voices today" Client reports pain level "a lot better today, "5" of 10. "I been up and about a little bit today" A: Writer provided emotional support, reviewed medications administered as prescribed. Client encouraged to report any concerns. Staff encouraged karaoke, will monitor q58min for safety. R: Client is safe on the unit, attended karaoke "I enjoyed it"

## 2015-03-13 NOTE — Progress Notes (Signed)
D: Patient complained of chronic back pain of 8/10. Received his scheduled Morphine 45 mg for pain. Denies SI, AH/VH at this time. Rated his depression 4/10. No behavioral issues noted  A: Support and encouragement offered. Due medications given as ordered. Every 15 minutes check for safety maintained. Will continue to monitor patient. R: Patient is cooperative and compliant. REMAINS SAFE.

## 2015-03-13 NOTE — Plan of Care (Signed)
Problem: Ineffective individual coping Goal: STG: Patient will remain free from self harm Outcome: Progressing Q 15 minutes checks maintained for safety without gestures or incident of self injurious behavior to note thus far.
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
## 2015-03-14 DIAGNOSIS — E221 Hyperprolactinemia: Secondary | ICD-10-CM | POA: Diagnosis present

## 2015-03-14 LAB — PROLACTIN: Prolactin: 28.5 ng/mL — ABNORMAL HIGH (ref 4.0–15.2)

## 2015-03-14 MED ORDER — TRAZODONE HCL 100 MG PO TABS
200.0000 mg | ORAL_TABLET | Freq: Every day | ORAL | Status: DC
  Administered 2015-03-14 – 2015-03-15 (×2): 200 mg via ORAL
  Filled 2015-03-14 (×5): qty 2
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
## 2015-03-14 NOTE — [facility] Group Notes (Signed)
[place] LCSW Group Therapy  03/14/2015  1:05 PM  Type of Therapy:  Group therapy  Participation Level:  Active  Participation Quality:  Attentive  Affect:  Flat  Cognitive:  Oriented  Insight:  Limited  Engagement in Therapy:  Limited  Modes of Intervention:  Discussion, Socialization  Summary of Progress/Problems:  Chaplain was here to lead a group on themes of hope and courage.  "I trust the Lord to give me courage, and he won't give me more than I can handle."  Talked about how he just keeps pushing on, "because I know it will get better.  It's not as bad as it was the last time." Sat quietly for the most part.  Nodded off a few times.  [name] [name] 03/14/2015 1:32 PM

## 2015-03-14 NOTE — Progress Notes (Signed)
[place] Group Notes:  (Nursing/MHT/Case Management/Adjunct)  Date:  03/14/2015  Time:  9:25 PM  Type of Therapy:  Psychoeducational Skills  Participation Level:  Active  Participation Quality:  Appropriate  Affect:  Appropriate  Cognitive:  Appropriate  Insight:  Appropriate  Engagement in Group:  Developing/Improving  Modes of Intervention:  Education  Summary of Progress/Problems: The patient shared with the group that he had a "pretty good" day overall since he was able to rest and eat his meals. As a theme for the day, his support system will be comprised of his cousin and son.   Archie Balboa S 03/14/2015, 9:25 PM
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
## 2015-03-14 NOTE — Progress Notes (Signed)
NUTRITION ASSESSMENT   INTERVENTION:  Encourage healthy diet and exercise as part of a healthy lifestyle in weekly nutrition class   Assessment:  Pt is a 60 y old CM with Bipolar do as well as chronic back pain presented with worsening mood sx and AH asking him to hurt self .  Consult received for hyperlipidemia education, deferred at this time as pt is in therapy.  Triglycerides: 211, LDL 111  49 y.o. male  Height: Ht Readings from Last 1 Encounters:  03/11/15 5\' 4"  (1.626 m)    Weight: Wt Readings from Last 1 Encounters:  03/11/15 187 lb (84.823 kg)    Weight Hx: Wt Readings from Last 10 Encounters:  03/11/15 187 lb (84.823 kg)  03/11/15 205 lb (92.987 kg)  01/29/15 210 lb (95.255 kg)  09/17/14 210 lb (95.255 kg)  04/08/14 210 lb (95.255 kg)  12/02/13 210 lb (95.255 kg)  10/25/13 210 lb (95.255 kg)  03/27/13 180 lb (81.647 kg)  02/17/12 179 lb (81.194 kg)  02/17/12 179 lb 9.6 oz (81.466 kg)    BMI:  Body mass index is 32.08 kg/(m^2). Pt meets criteria for obesity class I based on current BMI.  Estimated Nutritional Needs: Kcal: 25-30 kcal/kg Protein: > 1 gram protein/kg Fluid: 1 ml/kcal  Diet Order: Diet heart healthy/carb modified Room service appropriate?: Yes; Fluid consistency:: Thin Pt is also offered choice of unit snacks mid-morning and mid-afternoon.  Pt is eating as desired.   Lab results and medications reviewed.   [place], [place], [place] Pager [phone] After Hours Pager

## 2015-03-15 LAB — HEMOGLOBIN A1C
Hgb A1c MFr Bld: 5.5 % (ref 4.8–5.6)
Mean Plasma Glucose: 111 mg/dL
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
## 2015-03-15 NOTE — Progress Notes (Signed)
D: Patient denies SI/HI and A/V hallucinations  A: Monitored q 15 minutes;patient educated about medications; patient given medications per physician orders; patient encouraged to express feelings and/or concerns  R: Patient is cooperative and pleasant; patient is flat but brightens upon approach; patient was able to set goal to talk with staff 1:1 when having feelings of SI; patient engages minimally with peers but assertive about what he needs; patient is taking medications as prescribed and tolerating medications

## 2015-03-15 NOTE — [facility] Group Notes (Signed)
[facility] Group Notes:  (Clinical Social Work)  03/15/2015  11:15-12:00PM  Summary of Progress/Problems:   The main focus of today's process group was to discuss patients' feelings related to being hospitalized, as well as the difference between "being" and "having" a mental health diagnosis.  It was agreed in general by the group that it would be preferable to avoid future hospitalizations, and we discussed means of doing that.  As a follow-up, problems with adhering to medication recommendations were discussed.  The patient expressed his primary feeling about being hospitalized is "hopeful" because he is getting help.  He is concerned about his yard at home getting overgrown.  Type of Therapy:  Group Therapy - Process  Participation Level:  Active  Participation Quality:  Attentive  Affect:  Blunted  Cognitive:  Alert  Insight:  Developing/Improving  Engagement in Therapy:  Developing/Improving  Modes of Intervention:  Exploration, Discussion  Selmer Dominion, LCSW 03/15/2015, 12:11 PM

## 2015-03-15 NOTE — Progress Notes (Signed)
Patient ID: [name] [name], male   DOB: June 13, 1966, 49 y.o.   MRN: [id number] Pt visible in the milieu. Interacting appropriately with staff and peers. Attending group. Pt verbalized that he was positive for AH.  Pt reported "hearing his own thoughts". Pt was unable to provide specific information as to the content.  Pt denied SI, HI and VH. Support and encouragement provided. Pt receptive. Needs assessed. Pt denied. Fifteen minute checks in progress for patient safety. Pt safe on unit.

## 2015-03-15 NOTE — Progress Notes (Signed)
Morning Wellness Group 1000  Patient did not attend  The focus of this group is to educate the patient on the purpose and policies of crisis stabilization and provide a format to answer questions about their admission.  The group details unit policies and expectations of patients while admitted.

## 2015-03-15 NOTE — Progress Notes (Signed)
Report received from B. Technical sales engineer. Writer entered patients room and observed him lying in bed asleep, no distress noted. Safety maintained with 15 min checks.

## 2015-03-15 NOTE — Progress Notes (Signed)
D: Patient denies SI/HI and auditory and visual hallucinations. The patient has a pleasant mood and affect. The patient reports that he is "feeling better" and rates his depression, hopelessness, and anxiety all a 2 out of 10 (1 low/10 high). The patient denies having auditory hallucinations today and states that the voices are "gone for right now." The patient reports that his appetite and energy level are normal and that he is sleeping better at night. The patient is cooperative and pleasantly interacts with staff.  A: Patient given emotional support from RN. Patient encouraged to come to staff with concerns and/or questions. Patient's medication routine continued. Patient's orders and plan of care reviewed.  R: Patient remains appropriate and cooperative. Will continue to monitor patient q15 minutes for safety.

## 2015-03-16 LAB — VALPROIC ACID LEVEL: Valproic Acid Lvl: 59 ug/mL (ref 50.0–100.0)

## 2015-03-16 MED ORDER — TRAZODONE HCL 100 MG PO TABS
100.0000 mg | ORAL_TABLET | Freq: Every day | ORAL | Status: DC
  Administered 2015-03-16: 100 mg via ORAL
  Filled 2015-03-16: qty 3
  Filled 2015-03-16 (×2): qty 1

## 2015-03-16 NOTE — [facility] Group Notes (Signed)
[facility] Group Notes: (Clinical Social Work)  03/16/2015 11:00AM-12:00PM  Summary of Progress/Problems: The main focus of today's process group was to listen to a variety of genres of music and to identify that different types of music provoke different responses. The patient then was able to identify personally what was soothing or energizing for them, as well as how to use this knowledge in sleep habits, with depression, for voices, and with other symptoms. The patient expressed understanding of concepts, as well as knowledge of how each type of music affected him/her and how this can be used at home as a wellness/recovery tool.  He seemed to be in physical pain, kept moving around a lot, was up and down.  Type of Therapy: Music Therapy   Participation Level: Active  Participation Quality: Attentive   Affect: Appropriate  Cognitive: Oriented  Insight: Engaged  Engagement in Therapy: Engaged  Modes of Intervention: Activity, Exploration  Selmer Dominion, LCSW 03/16/2015  12:31 PM
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
## 2015-03-16 NOTE — [facility] Group Notes (Signed)
[facility] Group Notes:  Healthy support systems  Date:  03/16/2015  Time:  11:26 AM  Type of Therapy:  Group Therapy  Participation Level:  Active  Participation Quality:  Appropriate  Affect:  Appropriate  Cognitive:  Appropriate  Insight:  Appropriate  Engagement in Group:  Engaged  Modes of Intervention:  Discussion  Summary of Progress/Problems:  Delman Kitten 03/16/2015, 11:26 AM

## 2015-03-16 NOTE — Progress Notes (Signed)
Patient ID: GERHARDT GLEED, male   DOB: 1965/08/06, 49 y.o.   MRN: [id number] D: Client visible on the unit, watching TV in dayroom, quite but easy to approach. Client reports admission has been helpful "the medicine and group" "better when I'm not alone" "decreased voices" Client reports to avoid being alone he is thing about therapeutic services at [facility]. A: Writer encouraged client to continue medications and follow up with discharge plans to avoid relapse. Reviewed medications, administered as prescribed. Staff will monitor q45min for safety. R:Client is safe on the unit, attended group.

## 2015-03-16 NOTE — Progress Notes (Signed)
D- Patient is out in milieu interacting with peers and attending groups.  He reports "good" appetite and "fair" sleep.  [name] [name] continues to c/o chronic back pain and is requesting prn pain medications as an adjunct to scheduled ones.  He denies SI and feels he is making progress towards his treatment goals.  A- Support and encouragment offered.  Contine POC and evaluation of treatment goals.  Continue 15' checks for safety.  Safety maintained.

## 2015-03-16 NOTE — Progress Notes (Signed)
Adult Psychoeducational Group Note  Date:  03/16/2015 Time:  9:06 PM  Group Topic/Focus:  Wrap-Up Group:   The focus of this group is to help patients review their daily goal of treatment and discuss progress on daily workbooks.  Participation Level:  Active  Participation Quality:  Appropriate  Affect:  Appropriate  Cognitive:  Appropriate  Insight: Appropriate  Engagement in Group:  Engaged  Modes of Intervention:  Discussion  Additional Comments:The patient expressed that music therapy made him relaxed.The patient also said that he rates his day a 6.  [name] [name] 03/16/2015, 9:06 PM

## 2015-03-17 MED ORDER — ANORO ELLIPTA 62.5-25 MCG/INH IN AEPB: 1.0000 | INHALATION_SPRAY | Freq: Every day | RESPIRATORY_TRACT | Status: DC

## 2015-03-17 MED ORDER — DIVALPROEX SODIUM ER 250 MG PO TB24: 750.0000 mg | ORAL_TABLET | Freq: Every day | ORAL | Status: DC

## 2015-03-17 MED ORDER — OMEPRAZOLE 20 MG PO CPDR
20.0000 mg | DELAYED_RELEASE_CAPSULE | Freq: Every day | ORAL | Status: DC
Start: 2015-03-17 — End: 2015-09-30

## 2015-03-17 MED ORDER — BENZTROPINE MESYLATE 0.5 MG PO TABS: 0.5000 mg | ORAL_TABLET | Freq: Every day | ORAL | Status: DC

## 2015-03-17 MED ORDER — TRAZODONE HCL 100 MG PO TABS: 100.0000 mg | ORAL_TABLET | Freq: Every day | ORAL | Status: DC

## 2015-03-17 MED ORDER — LISINOPRIL 10 MG PO TABS
10.0000 mg | ORAL_TABLET | Freq: Every day | ORAL | Status: DC
Start: 2015-03-17 — End: 2015-09-30

## 2015-03-17 MED ORDER — DOCUSATE SODIUM 100 MG PO CAPS: 200.0000 mg | ORAL_CAPSULE | Freq: Every day | ORAL | Status: DC

## 2015-03-17 MED ORDER — MORPHINE SULFATE ER 50 MG PO CP24: 50.0000 mg | ORAL_CAPSULE | Freq: Two times a day (BID) | ORAL | Status: DC

## 2015-03-17 MED ORDER — SIMVASTATIN 20 MG PO TABS: 20.0000 mg | ORAL_TABLET | Freq: Every day | ORAL | Status: DC

## 2015-03-17 MED ORDER — HALOPERIDOL 5 MG PO TABS: 5.0000 mg | ORAL_TABLET | Freq: Every day | ORAL | Status: DC

## 2015-03-17 MED ORDER — IBUPROFEN 200 MG PO TABS: 800.0000 mg | ORAL_TABLET | Freq: Four times a day (QID) | ORAL | Status: DC | PRN

## 2015-03-17 NOTE — Tx Team (Signed)
Interdisciplinary Treatment Plan Update (Adult)  Date:  03/17/2015   Time Reviewed:  9:01 AM   Progress in Treatment: Attending groups: Yes. Participating in groups:  Yes. Taking medication as prescribed:  Yes. Tolerating medication:  Yes. Family/Significant othe contact made:  No Patient understands diagnosis:  Yes  As evidenced by seeking help with AH, paranoia Discussing patient identified problems/goals with staff:  Yes, see initial care plan. Medical problems stabilized or resolved:  Yes. Denies suicidal/homicidal ideation: Yes. Issues/concerns per patient self-inventory:  No. Other:  New problem(s) identified:  Discharge Plan or Barriers:  Return home, follow up outpt  Reason for Continuation of Hospitalization:   Comments:  Patient  states that he is afraid of the people that live behind him. He states he started hearing voices on July 4 that are getting worse. He states there are 19 male voices and one male voice. They tell him that someone is going to kill him. He thinks it is the neighbors behind him. He denies that they have actually made threats to him. He states "I am afraid for my life". He states his next appointment with his psychiatrist isn't until January. He states "I need some peace".   Anti psychotic trial  Estimated length of stay: D/C today  New goal(s):  Review of initial/current patient goals per problem list:   Review of initial/current patient goals per problem list:  1. Goal(s): Patient will participate in aftercare plan   Met: Yes   Target date: 3-5 days post admission date   As evidenced by: Patient will participate within aftercare plan AEB aftercare provider and housing plan at discharge being identified.  03/12/15: Pt plans to return home, follow up outpt.       2. Goal(s): Patient will demonstrate decreased signs of psychosis  * Met: Yes  * Target date: 3-5 days post admission date  * As evidenced by: Patient will demonstrate  decreased frequency of AVH or return to baseline function 8/10  Pt c/o AH and paranoia today. 03/17/2015 No signs nor symptoms of psychosis today          Attendees: Patient:  03/17/2015 9:01 AM   Family:   03/17/2015 9:01 AM   Physician:  Ursula Alert, MD 03/17/2015 9:01 AM   Nursing:   Gaylan Gerold, RN 03/17/2015 9:01 AM   CSW:    Roque Lias, LCSW   03/17/2015 9:01 AM   Other:  03/17/2015 9:01 AM   Other:   03/17/2015 9:01 AM   Other:  Lars Pinks, Nurse CM 03/17/2015 9:01 AM   Other:  Lucinda Dell, [facility] 03/17/2015 9:01 AM   Other:  Norberto Sorenson, [place]  03/17/2015 9:01 AM   Other:  03/17/2015 9:01 AM   Other:  03/17/2015 9:01 AM   Other:  03/17/2015 9:01 AM   Other:  03/17/2015 9:01 AM   Other:  03/17/2015 9:01 AM   Other:   03/17/2015 9:01 AM    Scribe for Treatment Team:   Trish Mage, 03/17/2015 9:01 AM

## 2015-03-17 NOTE — Progress Notes (Signed)
Pt. D/C from the unit to lobby where [facility] to transport him to [facility] per patient request.  He was pleasant and cooperative. Voiced no SI/HI or A/V halluciations.  Pt. Denies any pain or discomfort at time of discharge.  D/C instructions and medications reviewed with pt.  Pt. verbalized understanding of medications and all d/c instructions.   All belongings (black belt, cell phone, keys, pocket knife and shoes) returned to pt. Q 15 min checks maintained until discharge.  Pt. Left the unit in no apparent distress.
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
## 2015-03-17 NOTE — [facility] Suicide Risk Assessment (Addendum)
[facility] Discharge Suicide Risk Assessment   Demographic Factors:  Male and Caucasian  Total Time spent with patient: 30 minutes  Musculoskeletal: Strength & Muscle Tone: within normal limits Gait & Station: normal Patient leans: N/A  Psychiatric Specialty Exam: Physical Exam  Review of Systems  Psychiatric/Behavioral: Negative for depression, suicidal ideas and substance abuse.  All other systems reviewed and are negative.   Blood pressure 99/59, pulse 93, temperature 98.1 F (36.7 C), temperature source Oral, resp. rate 16, height 5\' 4"  (1.626 m), weight 84.823 kg (187 lb), SpO2 98 %.Body mass index is 32.08 kg/(m^2).  General Appearance: Casual  Eye Contact::  Good  Speech:  Clear and Coherent409  Volume:  Normal  Mood:  Euthymic  Affect:  Appropriate  Thought Process:  Coherent  Orientation:  Full (Time, Place, and Person)  Thought Content:  WDL  Suicidal Thoughts:  No  Homicidal Thoughts:  No  Memory:  Immediate;   Fair Recent;   Fair Remote;   Fair  Judgement:  Good  Insight:  Fair  Psychomotor Activity:  Normal  Concentration:  Fair  Recall:  [employer] of Knowledge:Fair  Language: Fair  Akathisia:  No  Handed:  Right  AIMS (if indicated):     Assets:  Communication Skills Desire for Improvement  Sleep:  Number of Hours: 6.75  Cognition: WNL  ADL's:  Intact   Have you used any form of tobacco in the last 30 days? (Cigarettes, Smokeless Tobacco, Cigars, and/or Pipes): No (client says he hasn't smoked in 3 years)  Has this patient used any form of tobacco in the last 30 days? (Cigarettes, Smokeless Tobacco, Cigars, and/or Pipes) No  Mental Status Per Nursing Assessment::   On Admission:     Current Mental Status by Physician: PT DENIES PSYHCOSIS, DENIES SI/HI AND REPORTS GOOD MOOD CONTROL.  Loss Factors: NA  Historical Factors: Impulsivity  Risk Reduction Factors:   Positive social support  Continued Clinical Symptoms:  Previous Psychiatric  Diagnoses and Treatments Medical Diagnoses and Treatments/Surgeries  Cognitive Features That Contribute To Risk:  None    Suicide Risk:  Minimal: No identifiable suicidal ideation.  Patients presenting with no risk factors but with morbid ruminations; may be classified as minimal risk based on the severity of the depressive symptoms  Principal Problem: Bipolar 1 disorder, mixed, moderate Discharge Diagnoses:  Patient Active Problem List   Diagnosis Date Noted  . Hyperprolactinemia [E22.1] 03/14/2015  . Bipolar 1 disorder, mixed, moderate [F31.62] 03/12/2015  . Opiate dependence, continuous [F11.20] 03/12/2015  . Cocaine use disorder, moderate, in sustained remission [F14.90] 03/12/2015  . Acute renal failure [N17.9] 02/17/2012  . Rhabdomyolysis [M62.82] 02/13/2012  . Dehydration [E86.0] 02/13/2012  . Hyponatremia [E87.1] 02/13/2012  . OVERWEIGHT [E66.3] 01/24/2009  . ARACHNOIDITIS [G03.9] 09/27/2008  . MALAISE AND FATIGUE [R53.81, R53.83] 09/27/2008  . HYPERLIPIDEMIA [E78.5] 07/01/2008  . BIPOLAR AFFECTIVE DISORDER [F31.9] 07/01/2008  . MIGRAINE HEADACHE [G43.909] 07/01/2008  . GERD [K21.9] 07/01/2008  . ARTHRITIS [M12.9] 07/01/2008  . DEGENERATIVE DISC DISEASE [IMO0002] 07/01/2008  . LOW BACK PAIN, CHRONIC [M54.5] 07/01/2008      Plan Of Care/Follow-up recommendations:  Activity:  No restrictions Diet:  regular Tests:  FOLLOW UP ON PROLACTIN LEVEL ON AN OUT PATIENT BASIS Other:  follow up with after care  Is patient on multiple antipsychotic therapies at discharge:  No   Has Patient had three or more failed trials of antipsychotic monotherapy by history:  No  Recommended Plan for Multiple Antipsychotic Therapies: NA  Madelene Kaatz MD 03/17/2015, 9:51 AM

## 2015-03-17 NOTE — Progress Notes (Signed)
  [facility] Adult Case Management Discharge Plan :  Will you be returning to the same living situation after discharge:  Yes,  home At discharge, do you have transportation home?: Yes,  [place] you have the ability to pay for your medications: Yes,  mental health  Release of information consent forms completed and in the chart;  Patient's signature needed at discharge.  Patient to Follow up at: Follow-up Information    Follow up with [facility].   Contact information:   [address] [place] [phone]      Patient denies SI/HI: Yes,  yes    Safety Planning and Suicide Prevention discussed: Yes,  yes  Have you used any form of tobacco in the last 30 days? (Cigarettes, Smokeless Tobacco, Cigars, and/or Pipes): No (client says he hasn't smoked in 3 years)  Has patient been referred to the Quitline?: N/A patient is not a smoker  [place], Barbaraann Rondo B 03/17/2015, 11:16 AM

## 2015-03-17 NOTE — [facility] Suicide Risk Assessment (Signed)
[facility] INPATIENT:  Family/Significant Other Suicide Prevention Education  Suicide Prevention Education:  Education Completed; No one has been identified by the patient as the family member/significant other with whom the patient will be residing, and identified as the person(s) who will aid the patient in the event of a mental health crisis (suicidal ideations/suicide attempt).  With written consent from the patient, the family member/significant other has been provided the following suicide prevention education, prior to the and/or following the discharge of the patient.  The suicide prevention education provided includes the following:  Suicide risk factors  Suicide prevention and interventions  National Suicide Hotline telephone number  [facility] assessment telephone number  [facility] Emergency Assistance [place] and/or Residential Mobile Crisis Unit telephone number  Request made of family/significant other to:  Remove weapons (e.g., guns, rifles, knives), all items previously/currently identified as safety concern.    Remove drugs/medications (over-the-counter, prescriptions, illicit drugs), all items previously/currently identified as a safety concern.  The family member/significant other verbalizes understanding of the suicide prevention education information provided.  The family member/significant other agrees to remove the items of safety concern listed above. The patient did not endorse SI at the time of admission, nor did the patient c/o SI during the stay here.  SPE not required.   Roque Lias B 03/17/2015, 4:07 PM

## 2015-03-21 DIAGNOSIS — F29 Unspecified psychosis not due to a substance or known physiological condition: Secondary | ICD-10-CM | POA: Diagnosis not present

## 2015-03-26 DIAGNOSIS — I10 Essential (primary) hypertension: Secondary | ICD-10-CM | POA: Diagnosis not present

## 2015-03-26 DIAGNOSIS — K219 Gastro-esophageal reflux disease without esophagitis: Secondary | ICD-10-CM | POA: Diagnosis not present

## 2015-03-26 DIAGNOSIS — E785 Hyperlipidemia, unspecified: Secondary | ICD-10-CM | POA: Diagnosis not present

## 2015-03-26 DIAGNOSIS — J42 Unspecified chronic bronchitis: Secondary | ICD-10-CM | POA: Diagnosis not present

## 2015-03-27 DIAGNOSIS — F319 Bipolar disorder, unspecified: Secondary | ICD-10-CM | POA: Diagnosis not present

## 2015-04-16 DIAGNOSIS — G4733 Obstructive sleep apnea (adult) (pediatric): Secondary | ICD-10-CM | POA: Diagnosis not present

## 2015-05-16 DIAGNOSIS — G4733 Obstructive sleep apnea (adult) (pediatric): Secondary | ICD-10-CM | POA: Diagnosis not present

## 2015-06-04 DIAGNOSIS — J449 Chronic obstructive pulmonary disease, unspecified: Secondary | ICD-10-CM | POA: Diagnosis not present

## 2015-06-04 DIAGNOSIS — I10 Essential (primary) hypertension: Secondary | ICD-10-CM | POA: Diagnosis not present

## 2015-06-04 DIAGNOSIS — G4700 Insomnia, unspecified: Secondary | ICD-10-CM | POA: Diagnosis not present

## 2015-06-04 DIAGNOSIS — G4730 Sleep apnea, unspecified: Secondary | ICD-10-CM | POA: Diagnosis not present

## 2015-06-06 DIAGNOSIS — G039 Meningitis, unspecified: Secondary | ICD-10-CM | POA: Diagnosis not present

## 2015-06-06 DIAGNOSIS — S32009A Unspecified fracture of unspecified lumbar vertebra, initial encounter for closed fracture: Secondary | ICD-10-CM | POA: Diagnosis not present

## 2015-06-16 DIAGNOSIS — G4733 Obstructive sleep apnea (adult) (pediatric): Secondary | ICD-10-CM | POA: Diagnosis not present

## 2015-06-18 DIAGNOSIS — F319 Bipolar disorder, unspecified: Secondary | ICD-10-CM | POA: Diagnosis not present

## 2015-06-25 DIAGNOSIS — Z23 Encounter for immunization: Secondary | ICD-10-CM | POA: Diagnosis not present

## 2015-06-25 DIAGNOSIS — F339 Major depressive disorder, recurrent, unspecified: Secondary | ICD-10-CM | POA: Diagnosis not present

## 2015-06-25 DIAGNOSIS — E669 Obesity, unspecified: Secondary | ICD-10-CM | POA: Diagnosis not present

## 2015-06-25 DIAGNOSIS — E785 Hyperlipidemia, unspecified: Secondary | ICD-10-CM | POA: Diagnosis not present

## 2015-06-25 DIAGNOSIS — I10 Essential (primary) hypertension: Secondary | ICD-10-CM | POA: Diagnosis not present

## 2015-07-16 DIAGNOSIS — G4733 Obstructive sleep apnea (adult) (pediatric): Secondary | ICD-10-CM | POA: Diagnosis not present

## 2015-08-16 DIAGNOSIS — G4733 Obstructive sleep apnea (adult) (pediatric): Secondary | ICD-10-CM | POA: Diagnosis not present

## 2015-09-11 IMAGING — CR DG CHEST 1V PORT
1 series · 1 of 1 positions shown · non-contrast
Comparison: 10/02/2014

CLINICAL DATA: Chronic nonproductive cough, shortness of Breath
worsening today

EXAM:
PORTABLE CHEST - 1 VIEW

[ap portable]
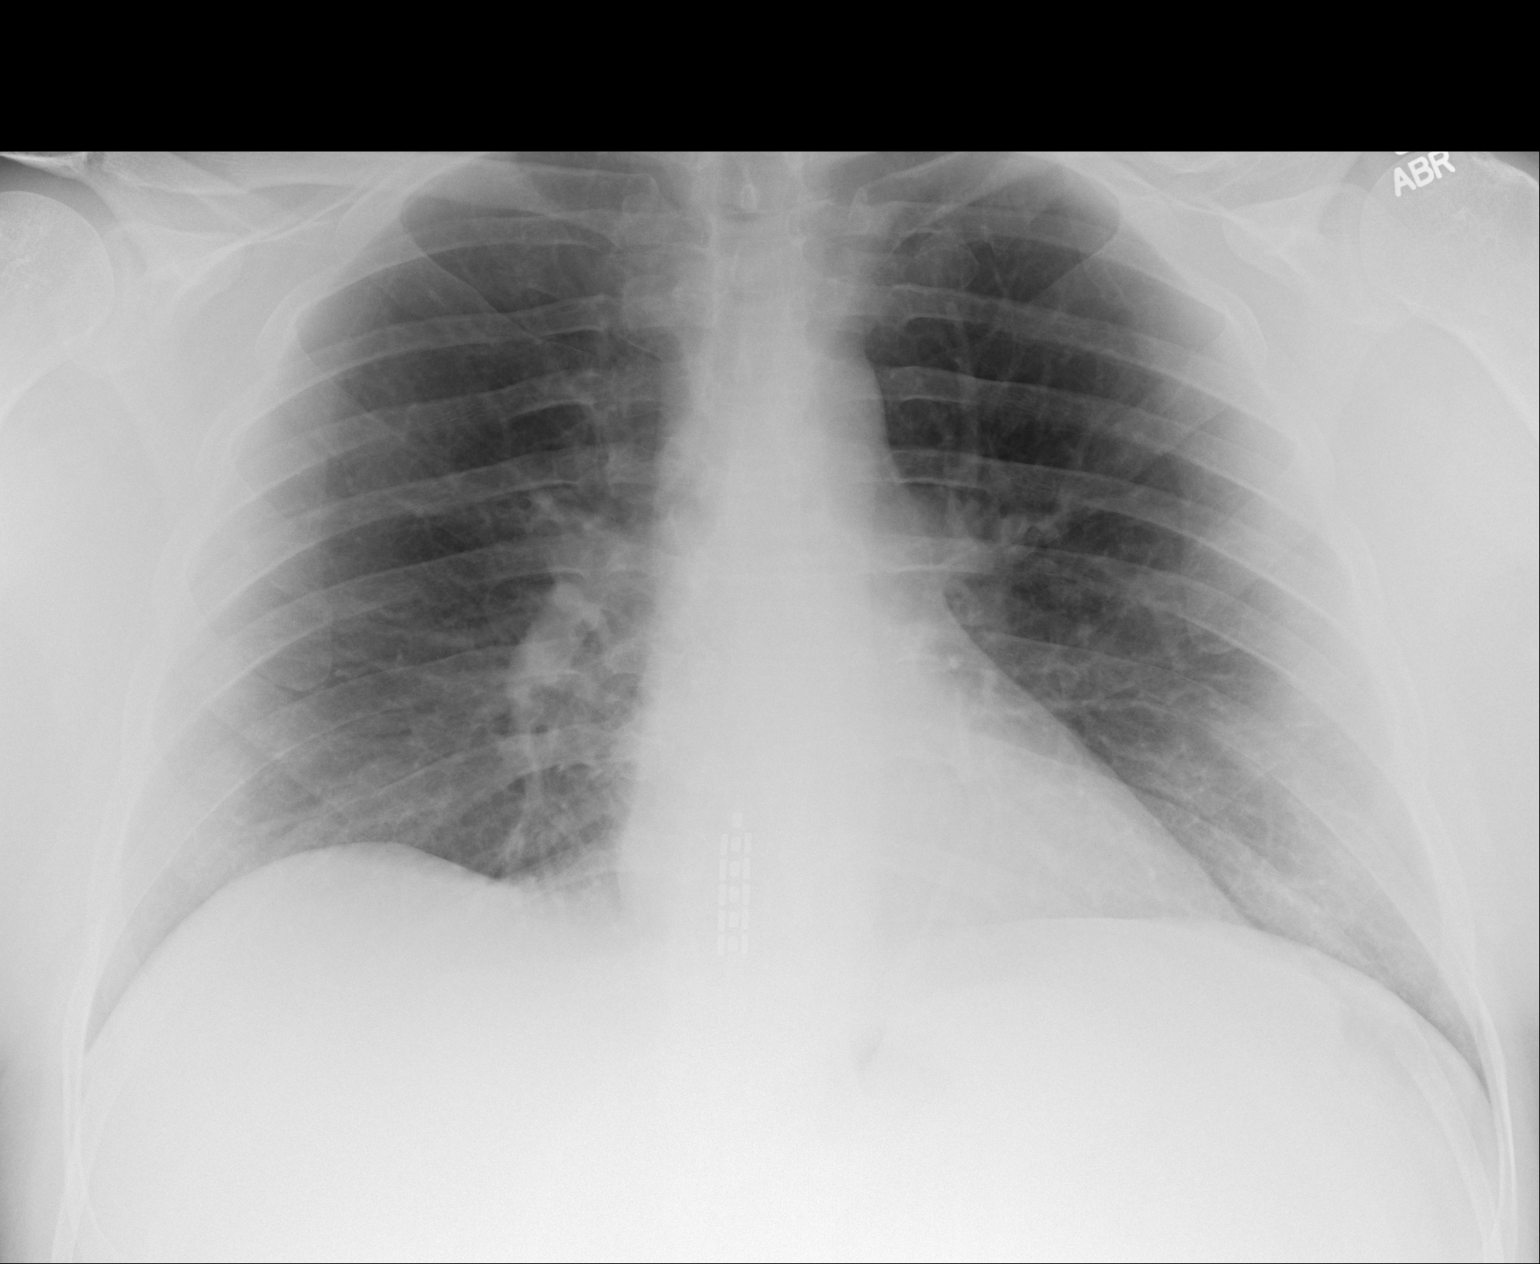

[1 of 1 positions shown; findings below may reference images not displayed]

FINDINGS: Cardiomediastinal silhouette is stable. No acute infiltrate or
pleural effusion. No pulmonary edema. Bony thorax is unremarkable.
Again noted spinal stimulator wire lower thoracic spine.
IMPRESSION: No active disease.

## 2015-09-16 DIAGNOSIS — G4733 Obstructive sleep apnea (adult) (pediatric): Secondary | ICD-10-CM | POA: Diagnosis not present

## 2015-09-24 ENCOUNTER — Ambulatory Visit (HOSPITAL_COMMUNITY)
Admission: RE | Admit: 2015-09-24 | Discharge: 2015-09-24 | Disposition: A | Discharge status: Home / Self Care | Payer: Commercial Managed Care - HMO | Source: Ambulatory Visit | Attending: Internal Medicine | Admitting: Internal Medicine

## 2015-09-24 ENCOUNTER — Other Ambulatory Visit (HOSPITAL_COMMUNITY): Payer: Self-pay | Admitting: Internal Medicine

## 2015-09-24 DIAGNOSIS — E785 Hyperlipidemia, unspecified: Secondary | ICD-10-CM | POA: Diagnosis not present

## 2015-09-24 DIAGNOSIS — I10 Essential (primary) hypertension: Secondary | ICD-10-CM | POA: Diagnosis not present

## 2015-09-24 DIAGNOSIS — M19071 Primary osteoarthritis, right ankle and foot: Secondary | ICD-10-CM | POA: Diagnosis not present

## 2015-09-24 DIAGNOSIS — M25571 Pain in right ankle and joints of right foot: Secondary | ICD-10-CM

## 2015-09-26 ENCOUNTER — Inpatient Hospital Stay (HOSPITAL_COMMUNITY)
Admission: AD | Admit: 2015-09-26 | Discharge: 2015-09-30 | DRG: 885 | Disposition: A | Discharge status: Home / Self Care | Payer: Commercial Managed Care - HMO | Source: Intra-hospital | Attending: Psychiatry | Admitting: Psychiatry

## 2015-09-26 ENCOUNTER — Encounter (HOSPITAL_COMMUNITY): Payer: Self-pay | Admitting: *Deleted

## 2015-09-26 ENCOUNTER — Encounter (HOSPITAL_COMMUNITY): Payer: Self-pay | Admitting: Emergency Medicine

## 2015-09-26 ENCOUNTER — Emergency Department (HOSPITAL_COMMUNITY)
Admission: EM | Admit: 2015-09-26 | Disposition: A | Discharge status: Home / Self Care | Payer: Self-pay | Source: Home / Self Care

## 2015-09-26 ENCOUNTER — Emergency Department (HOSPITAL_COMMUNITY)
Admission: EM | Admit: 2015-09-26 | Discharge: 2015-09-26 | Disposition: A | Discharge status: Other Acute Inpatient Hospital | Payer: Commercial Managed Care - HMO | Attending: Emergency Medicine | Admitting: Emergency Medicine

## 2015-09-26 DIAGNOSIS — Z9114 Patient's other noncompliance with medication regimen: Secondary | ICD-10-CM | POA: Diagnosis not present

## 2015-09-26 DIAGNOSIS — G478 Other sleep disorders: Secondary | ICD-10-CM | POA: Diagnosis not present

## 2015-09-26 DIAGNOSIS — Z811 Family history of alcohol abuse and dependence: Secondary | ICD-10-CM

## 2015-09-26 DIAGNOSIS — E785 Hyperlipidemia, unspecified: Secondary | ICD-10-CM | POA: Diagnosis not present

## 2015-09-26 DIAGNOSIS — F1110 Opioid abuse, uncomplicated: Secondary | ICD-10-CM | POA: Diagnosis not present

## 2015-09-26 DIAGNOSIS — F319 Bipolar disorder, unspecified: Secondary | ICD-10-CM | POA: Insufficient documentation

## 2015-09-26 DIAGNOSIS — Z87891 Personal history of nicotine dependence: Secondary | ICD-10-CM | POA: Diagnosis not present

## 2015-09-26 DIAGNOSIS — Z23 Encounter for immunization: Secondary | ICD-10-CM | POA: Diagnosis not present

## 2015-09-26 DIAGNOSIS — F3110 Bipolar disorder, current episode manic without psychotic features, unspecified: Secondary | ICD-10-CM | POA: Diagnosis not present

## 2015-09-26 DIAGNOSIS — G4700 Insomnia, unspecified: Secondary | ICD-10-CM | POA: Diagnosis present

## 2015-09-26 DIAGNOSIS — F3162 Bipolar disorder, current episode mixed, moderate: Principal | ICD-10-CM | POA: Diagnosis present

## 2015-09-26 DIAGNOSIS — F410 Panic disorder [episodic paroxysmal anxiety] without agoraphobia: Secondary | ICD-10-CM | POA: Diagnosis not present

## 2015-09-26 DIAGNOSIS — Z79899 Other long term (current) drug therapy: Secondary | ICD-10-CM | POA: Insufficient documentation

## 2015-09-26 DIAGNOSIS — R443 Hallucinations, unspecified: Secondary | ICD-10-CM | POA: Insufficient documentation

## 2015-09-26 DIAGNOSIS — Z79891 Long term (current) use of opiate analgesic: Secondary | ICD-10-CM | POA: Insufficient documentation

## 2015-09-26 DIAGNOSIS — Z8781 Personal history of (healed) traumatic fracture: Secondary | ICD-10-CM | POA: Diagnosis not present

## 2015-09-26 DIAGNOSIS — F419 Anxiety disorder, unspecified: Secondary | ICD-10-CM | POA: Diagnosis not present

## 2015-09-26 DIAGNOSIS — R440 Auditory hallucinations: Secondary | ICD-10-CM | POA: Diagnosis present

## 2015-09-26 DIAGNOSIS — R45851 Suicidal ideations: Secondary | ICD-10-CM | POA: Diagnosis present

## 2015-09-26 DIAGNOSIS — K219 Gastro-esophageal reflux disease without esophagitis: Secondary | ICD-10-CM | POA: Diagnosis not present

## 2015-09-26 DIAGNOSIS — Z008 Encounter for other general examination: Secondary | ICD-10-CM | POA: Diagnosis present

## 2015-09-26 LAB — CBC WITH DIFFERENTIAL/PLATELET
BASOS ABS: 0 10*3/uL (ref 0.0–0.1)
Basophils Relative: 0 %
EOS ABS: 0.1 10*3/uL (ref 0.0–0.7)
EOS PCT: 1 %
HCT: 43.8 % (ref 39.0–52.0)
HEMOGLOBIN: 16.1 g/dL (ref 13.0–17.0)
LYMPHS ABS: 2.4 10*3/uL (ref 0.7–4.0)
Lymphocytes Relative: 24 %
MCH: 30.7 pg (ref 26.0–34.0)
MCHC: 36.8 g/dL — ABNORMAL HIGH (ref 30.0–36.0)
MCV: 83.6 fL (ref 78.0–100.0)
Monocytes Absolute: 1.1 10*3/uL — ABNORMAL HIGH (ref 0.1–1.0)
Monocytes Relative: 12 %
Neutro Abs: 6.2 10*3/uL (ref 1.7–7.7)
Neutrophils Relative %: 63 %
PLATELETS: 271 10*3/uL (ref 150–400)
RBC: 5.24 MIL/uL (ref 4.22–5.81)
RDW: 12.3 % (ref 11.5–15.5)
WBC: 9.8 10*3/uL (ref 4.0–10.5)

## 2015-09-26 LAB — ETHANOL: Alcohol, Ethyl (B): <5 mg/dL (ref <5)

## 2015-09-26 LAB — RAPID URINE DRUG SCREEN, HOSP PERFORMED
Amphetamines: NOT DETECTED
BARBITURATES: NOT DETECTED
Benzodiazepines: NOT DETECTED
Cocaine: NOT DETECTED
Opiates: POSITIVE — AB
Tetrahydrocannabinol: NOT DETECTED

## 2015-09-26 LAB — BASIC METABOLIC PANEL
Anion gap: 12 (ref 5–15)
BUN: 6 mg/dL (ref 6–20)
CALCIUM: 9 mg/dL (ref 8.9–10.3)
CHLORIDE: 102 mmol/L (ref 101–111)
CO2: 24 mmol/L (ref 22–32)
CREATININE: 0.99 mg/dL (ref 0.61–1.24)
GFR calc non Af Amer: >60 mL/min (ref >60)
Glucose, Bld: 117 mg/dL — ABNORMAL HIGH (ref 65–99)
POTASSIUM: 3.3 mmol/L — AB (ref 3.5–5.1)
SODIUM: 138 mmol/L (ref 135–145)

## 2015-09-26 MED ORDER — MORPHINE SULFATE 15 MG PO TABS
30.0000 mg | ORAL_TABLET | Freq: Four times a day (QID) | ORAL | Status: DC | PRN
  Administered 2015-09-26 – 2015-09-30 (×11): 30 mg via ORAL
  Filled 2015-09-26 (×11): qty 2

## 2015-09-26 MED ORDER — ACETAMINOPHEN 325 MG PO TABS
650.0000 mg | ORAL_TABLET | Freq: Four times a day (QID) | ORAL | Status: DC | PRN
Start: 2015-09-26 — End: 2015-09-30

## 2015-09-26 MED ORDER — TRAZODONE HCL 100 MG PO TABS
100.0000 mg | ORAL_TABLET | Freq: Every day | ORAL | Status: DC
  Administered 2015-09-26 – 2015-09-29 (×4): 100 mg via ORAL
  Filled 2015-09-26 (×7): qty 1

## 2015-09-26 MED ORDER — SIMVASTATIN 20 MG PO TABS
20.0000 mg | ORAL_TABLET | Freq: Every day | ORAL | Status: DC
  Administered 2015-09-26 – 2015-09-29 (×4): 20 mg via ORAL
  Filled 2015-09-26 (×8): qty 1

## 2015-09-26 MED ORDER — POTASSIUM CHLORIDE CRYS ER 20 MEQ PO TBCR
40.0000 meq | EXTENDED_RELEASE_TABLET | Freq: Once | ORAL | Status: AC
  Administered 2015-09-26: 40 meq via ORAL
  Filled 2015-09-26 (×2): qty 2

## 2015-09-26 MED ORDER — ALBUTEROL SULFATE HFA 108 (90 BASE) MCG/ACT IN AERS
2.0000 | INHALATION_SPRAY | RESPIRATORY_TRACT | Status: DC | PRN
  Administered 2015-09-27 – 2015-09-30 (×8): 2 via RESPIRATORY_TRACT
  Filled 2015-09-26: qty 6.7

## 2015-09-26 MED ORDER — DIVALPROEX SODIUM ER 500 MG PO TB24
750.0000 mg | ORAL_TABLET | Freq: Every day | ORAL | Status: DC
  Administered 2015-09-26 – 2015-09-29 (×4): 750 mg via ORAL
  Filled 2015-09-26 (×7): qty 1

## 2015-09-26 MED ORDER — PANTOPRAZOLE SODIUM 40 MG PO TBEC
40.0000 mg | DELAYED_RELEASE_TABLET | Freq: Every day | ORAL | Status: DC
  Administered 2015-09-27 – 2015-09-30 (×4): 40 mg via ORAL
  Filled 2015-09-26 (×8): qty 1

## 2015-09-26 MED ORDER — INFLUENZA VAC SPLIT QUAD 0.5 ML IM SUSY
0.5000 mL | PREFILLED_SYRINGE | INTRAMUSCULAR | Status: AC
  Administered 2015-09-27: 0.5 mL via INTRAMUSCULAR
  Filled 2015-09-26: qty 0.5

## 2015-09-26 MED ORDER — PNEUMOCOCCAL VAC POLYVALENT 25 MCG/0.5ML IJ INJ
0.5000 mL | INJECTION | INTRAMUSCULAR | Status: AC
  Administered 2015-09-27: 0.5 mL via INTRAMUSCULAR

## 2015-09-26 MED ORDER — LISINOPRIL 10 MG PO TABS
10.0000 mg | ORAL_TABLET | Freq: Every day | ORAL | Status: DC
  Administered 2015-09-27 – 2015-09-30 (×4): 10 mg via ORAL
  Filled 2015-09-26 (×5): qty 1
  Filled 2015-09-26: qty 2
  Filled 2015-09-26 (×2): qty 1

## 2015-09-26 MED ORDER — MORPHINE SULFATE 15 MG PO TABS
30.0000 mg | ORAL_TABLET | Freq: Once | ORAL | Status: AC
  Administered 2015-09-26: 30 mg via ORAL
  Filled 2015-09-26: qty 2

## 2015-09-26 MED ORDER — IBUPROFEN 600 MG PO TABS
600.0000 mg | ORAL_TABLET | Freq: Four times a day (QID) | ORAL | Status: DC | PRN
  Administered 2015-09-26 – 2015-09-29 (×2): 600 mg via ORAL
  Filled 2015-09-26 (×2): qty 1

## 2015-09-26 MED ORDER — MAGNESIUM HYDROXIDE 400 MG/5ML PO SUSP
30.0000 mL | Freq: Every day | ORAL | Status: DC | PRN
  Administered 2015-09-27: 30 mL via ORAL
  Filled 2015-09-26: qty 30

## 2015-09-26 MED ORDER — ALUM & MAG HYDROXIDE-SIMETH 200-200-20 MG/5ML PO SUSP
30.0000 mL | ORAL | Status: DC | PRN
  Administered 2015-09-27: 30 mL via ORAL
  Filled 2015-09-26: qty 30

## 2015-09-26 NOTE — ED Notes (Signed)
76 yom PMHx Bipolar states he has been hearing voices for the past 2-3 days. Voices are not telling him anything specific. Also hasn't slept in 2-3 days and has had no appetite.   He denies visual hallucinations, denies SI/HI ideations. Lives at home alone. Currently sees Dr. Hoyle Barr for psychiatry and states he is compliant with his medications. Last psych admit per pt was 1 year ago.

## 2015-09-26 NOTE — Progress Notes (Signed)
Pt accepted to [facility] bed 503-2. Attending is Dr. Shea Evans. Report can be called at [phone].  Sharren Bridge, MSW, LCSW Clinical Social Work, Disposition  09/26/2015 [phone]

## 2015-09-26 NOTE — ED Notes (Signed)
Tele-psych assessment in place 

## 2015-09-26 NOTE — ED Notes (Signed)
Telepsych assessment completed.

## 2015-09-26 NOTE — Tx Team (Signed)
Initial Interdisciplinary Treatment Plan   PATIENT STRESSORS: Loss of sleep Medication change or noncompliance   PATIENT STRENGTHS: Capable of independent living Communication skills General fund of knowledge   PROBLEM LIST: Problem List/Patient Goals Date to be addressed Date deferred Reason deferred Estimated date of resolution                                                         DISCHARGE CRITERIA:  Ability to meet basic life and health needs Adequate post-discharge living arrangements Improved stabilization in mood, thinking, and/or behavior Medical problems require only outpatient monitoring Motivation to continue treatment in a less acute level of care Need for constant or close observation no longer present Reduction of life-threatening or endangering symptoms to within safe limits Safe-care adequate arrangements made Verbal commitment to aftercare and medication compliance  PRELIMINARY DISCHARGE PLAN: Outpatient therapy Return to previous living arrangement  PATIENT/FAMIILY INVOLVEMENT: This treatment plan has been presented to and reviewed with the patient, [name] [name], and/or family member, .  The patient and family have been given the opportunity to ask questions and make suggestions.  [name] [name] 09/26/2015, 7:09 PM
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
## 2015-09-26 NOTE — ED Notes (Addendum)
Report given to Nira Conn, RN at [facility] for room [phone]. [facility] called to arrange for transport.

## 2015-09-27 DIAGNOSIS — Z811 Family history of alcohol abuse and dependence: Secondary | ICD-10-CM | POA: Diagnosis not present

## 2015-09-27 DIAGNOSIS — F410 Panic disorder [episodic paroxysmal anxiety] without agoraphobia: Secondary | ICD-10-CM | POA: Diagnosis not present

## 2015-09-27 DIAGNOSIS — E785 Hyperlipidemia, unspecified: Secondary | ICD-10-CM | POA: Diagnosis not present

## 2015-09-27 DIAGNOSIS — Z87891 Personal history of nicotine dependence: Secondary | ICD-10-CM | POA: Diagnosis not present

## 2015-09-27 DIAGNOSIS — F3162 Bipolar disorder, current episode mixed, moderate: Secondary | ICD-10-CM | POA: Diagnosis not present

## 2015-09-27 DIAGNOSIS — G4700 Insomnia, unspecified: Secondary | ICD-10-CM | POA: Diagnosis not present

## 2015-09-27 DIAGNOSIS — R45851 Suicidal ideations: Secondary | ICD-10-CM | POA: Diagnosis not present

## 2015-09-27 DIAGNOSIS — K219 Gastro-esophageal reflux disease without esophagitis: Secondary | ICD-10-CM | POA: Diagnosis not present

## 2015-09-27 DIAGNOSIS — Z9114 Patient's other noncompliance with medication regimen: Secondary | ICD-10-CM | POA: Diagnosis not present

## 2015-09-27 LAB — LIPID PANEL
CHOLESTEROL: 204 mg/dL — AB (ref 0–200)
HDL: 38 mg/dL — ABNORMAL LOW (ref >40)
LDL Cholesterol: 125 mg/dL — ABNORMAL HIGH (ref 0–99)
TRIGLYCERIDES: 204 mg/dL — AB (ref <150)
Total CHOL/HDL Ratio: 5.4 RATIO
VLDL: 41 mg/dL — AB (ref 0–40)

## 2015-09-27 LAB — TSH: TSH: 1.94 u[IU]/mL (ref 0.350–4.500)

## 2015-09-27 LAB — VALPROIC ACID LEVEL: VALPROIC ACID LVL: 84 ug/mL (ref 50.0–100.0)

## 2015-09-27 MED ORDER — BENZTROPINE MESYLATE 0.5 MG PO TABS
0.5000 mg | ORAL_TABLET | Freq: Two times a day (BID) | ORAL | Status: DC
  Administered 2015-09-27 – 2015-09-30 (×6): 0.5 mg via ORAL
  Filled 2015-09-27 (×11): qty 1

## 2015-09-27 MED ORDER — RISPERIDONE 0.5 MG PO TABS
0.5000 mg | ORAL_TABLET | Freq: Two times a day (BID) | ORAL | Status: DC
  Administered 2015-09-27 – 2015-09-30 (×6): 0.5 mg via ORAL
  Filled 2015-09-27 (×11): qty 1

## 2015-09-27 NOTE — Progress Notes (Signed)
Writer spoke with patient 1:1 and he c/o back pain and received a prn of pain medication. He has been in his room resting and was compliant with his medications. He was admitted shortly before shift change and is getting familiar with the routine and his room mate. He denies si/hi or visual hallucinations but reports that he hears voices and verbally contracts for safety. Support given and safety maintained on unit.
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
## 2015-09-27 NOTE — Progress Notes (Signed)
Patient has been in bed asleep at the beginning of shift and later got up and watched tv for a short period of time. He report that apparently he needed rest and the voices are not as bad as they were when he first arrived here. He was compliant with scheduled medications and returned to the dayroom to watch tv. Safety maintained on unit with 15 min checks.,

## 2015-09-27 NOTE — BHH Group Notes (Addendum)
[facility] Group Notes:  (Nursing--Healthy Coping Skills)  Date:  09/27/2015  Time:  1330 Type of Therapy:  Nurse Education  Participation Level:  Minimal  Participation Quality:  Appropriate and Attentive  Affect:  Flat  Cognitive:  Alert, Appropriate and Oriented  Insight:  Appropriate, good  Engagement in Group:  Engaged  Modes of Intervention:  Discussion, Education and Problem-solving  Summary of Progress/Problems: Pt attended group and was engaged with peers and staff.   [place] [employer] 09/27/2015,1330

## 2015-09-27 NOTE — Progress Notes (Signed)
D: Pt observed asleep in bed at intervals during unit activities. Confirmed poor sleep for days leading to admission as per nursing report. Presents with flat affect and depressed mood but brightens up a little on interactions. Pt denies SI, HI, and VH. Reported mumbles with AH and he was medicated for chronic back pain as per RN's assessment on pt's request. Pt went off unit for meals and returned without issues.  A: Scheduled and PRN medications (Morphine & Albuterol)  administered as per MD's orders with verbal education. Writer provided emotional support and availability to pt throughout this shift. Verbal encouragement offered towards treatment compliance including group attendance. Q 15 minutes checks maintained for safety as ordered without behavioral outburst or self harm activities to note thus far. R: Pt receptive to care and cooperative with unit routines. Denies adverse drug reactions. Remains safe on and off unit. Continue POC.

## 2015-09-27 NOTE — Plan of Care (Signed)
Problem: Ineffective individual coping Goal: STG: Patient will remain free from self harm Outcome: Progressing Safety maintained on Q 15 minutes checks without self harm gestures thus far this shift.   Problem: Diagnosis: Increased Risk For Suicide Attempt Goal: STG-Patient Will Comply With Medication Regime Outcome: Progressing Pt compliant with medications as ordered when offered. Denies adverse drug reactions at this time.

## 2015-09-27 NOTE — BHH Suicide Risk Assessment (Signed)
[facility] Admission Suicide Risk Assessment   Nursing information obtained from:  Patient Demographic factors:  Divorced or widowed, Caucasian, Low socioeconomic status, Living alone, Unemployed Current Mental Status:  Worsening AH Loss Factors:  Decline in physical health, Legal issues Historical Factors:  Family history of suicide Risk Reduction Factors:  NA  Total Time spent with patient: 1 hour Principal Problem: Auditory hallucinations Diagnosis:   Patient Active Problem List   Diagnosis Date Noted  . Auditory hallucinations [R44.0] 09/26/2015  . Hyperprolactinemia ([place]) [E22.1] 03/14/2015  . Bipolar 1 disorder, mixed, moderate ([place]) [F31.62] 03/12/2015  . Opiate dependence, continuous ([place]) [F11.20] 03/12/2015  . Cocaine use disorder, moderate, in sustained remission [F14.90] 03/12/2015  . Acute renal failure ([place]) [N17.9] 02/17/2012  . Rhabdomyolysis [M62.82] 02/13/2012  . Dehydration [E86.0] 02/13/2012  . Hyponatremia [E87.1] 02/13/2012  . OVERWEIGHT [E66.3] 01/24/2009  . ARACHNOIDITIS [G03.9] 09/27/2008  . MALAISE AND FATIGUE [R53.81, R53.83] 09/27/2008  . HYPERLIPIDEMIA [E78.5] 07/01/2008  . BIPOLAR AFFECTIVE DISORDER [F31.9] 07/01/2008  . MIGRAINE HEADACHE [G43.909] 07/01/2008  . GERD [K21.9] 07/01/2008  . ARTHRITIS [M12.9] 07/01/2008  . DEGENERATIVE DISC DISEASE [IMO0002] 07/01/2008  . LOW BACK PAIN, CHRONIC [M54.5] 07/01/2008   Subjective Data: Pt reports he is having worsening AH that are causing insomnia. He is unable to give details about the voices but does appear to be responding to internal stimuli. Denies VH, SI and HI. Denies hx of previous suicide attempts. Denies depression and anxiety. Denies drug and alcohol use. States Trazodone helps with sleep.   Continued Clinical Symptoms:  Alcohol Use Disorder Identification Test Final Score (AUDIT): 2 The "Alcohol Use Disorders Identification Test", Guidelines for Use in Primary Care, Second Edition.  World Social worker [facility]). Score between 0-7:  no or low risk or alcohol related problems. Score between 8-15:  moderate risk of alcohol related problems. Score between 16-19:  high risk of alcohol related problems. Score 20 or above:  warrants further diagnostic evaluation for alcohol dependence and treatment.   CLINICAL FACTORS:   Currently Psychotic   Musculoskeletal: Strength & Muscle Tone: within normal limits Gait & Station: normal Patient leans: straight  Psychiatric Specialty Exam: Review of Systems  Cardiovascular: Negative for chest pain.  Gastrointestinal: Negative for abdominal pain.  Musculoskeletal: Positive for back pain and joint pain.  Neurological: Negative for headaches.  Psychiatric/Behavioral: Positive for hallucinations. Negative for depression, suicidal ideas and substance abuse. The patient has insomnia. The patient is not nervous/anxious.     Blood pressure 138/86, pulse 98, temperature 97.4 F (36.3 C), temperature source Oral, resp. rate 14, height 5' 5.5" (1.664 m), weight 93.895 kg (207 lb).Body mass index is 33.91 kg/(m^2).  General Appearance: Casual  Eye Contact::  Poor  Speech:  Slow  Volume:  Decreased  Mood:  Euthymic  Affect:  Blunt  Thought Process:  slowed  Orientation:  Full (Time, Place, and Person)  Thought Content:  Hallucinations: Auditory  Suicidal Thoughts:  No  Homicidal Thoughts:  No  Memory:  Immediate;   Poor Recent;   Poor Remote;   Poor  Judgement:  Fair  Insight:  Shallow  Psychomotor Activity:  Decreased  Concentration:  Poor  Recall:  Poor  Fund of Knowledge:Fair  Language: Good  Akathisia:  No  Handed:  Right  AIMS (if indicated):     Assets:  Desire for Improvement  Sleep:     Cognition: WNL  ADL's:  Intact    COGNITIVE FEATURES THAT CONTRIBUTE TO RISK:  Closed-mindedness, Polarized thinking and  Thought constriction (tunnel vision)    SUICIDE RISK:   Mild:  Suicidal ideation of limited frequency, intensity,  duration, and specificity.  There are no identifiable plans, no associated intent, mild dysphoria and related symptoms, good self-control (both objective and subjective assessment), few other risk factors, and identifiable protective factors, including available and accessible social support.  PLAN OF CARE: admit to inpt psych. See H&P.  I certify that inpatient services furnished can reasonably be expected to improve the patient's condition.   Charlcie Cradle, MD 09/27/2015, 12:47 PM

## 2015-09-27 NOTE — Progress Notes (Signed)
Did not attend group 

## 2015-09-27 NOTE — BHH Group Notes (Signed)
[facility] LCSW Group Therapy  09/27/2015 1:15 PM   Type of Therapy:  Group Therapy  Participation Level:  Did Not Attend  Regan Lemming, LCSW 09/27/2015 1:15 PM

## 2015-09-27 NOTE — BHH Suicide Risk Assessment (Signed)
[facility] Adult Inpatient Family/Significant Other Suicide Prevention Education  Suicide Prevention Education:   Patient Refusal for Family/Significant Other Suicide Prevention Education: The patient has refused to provide written consent for family/significant other to be provided Family/Significant Other Suicide Prevention Education during admission and/or prior to discharge.  Physician notified.  CSW provided suicide prevention information with patient.    The suicide prevention education provided includes the following:  Suicide risk factors  Suicide prevention and interventions  National Suicide Hotline telephone number  [facility] assessment telephone number  [facility] Emergency Assistance [place] and/or Residential Mobile Crisis Unit telephone number   Regan Lemming, [place] 09/27/2015 3:04 PM

## 2015-09-28 NOTE — Plan of Care (Signed)
Problem: Diagnosis: Increased Risk For Suicide Attempt Goal: STG-Patient Will Attend All Groups On The Unit Outcome: Not Progressing Patient did not attend goup tonight he was in the bed asleep,

## 2015-09-28 NOTE — Progress Notes (Signed)
D:Per patient self inventory form pt reports he slept good last night. Pt reports a fair appetite, low energy level, good concentration. Pt rates depression 1/10, hopelessness 1/10, anxiety 2/10- all on 0-10 scale, 10 being the worse. Pt reports chronic back pain. Pt reports his goal is "rest" and that he will meet his goal by "rest." Pt reports he feels a lot better today. Pt reports auditory hallucinations "just a little bit" Pt denies HI.  A:Special checks q 15 mins in place for safety. Medication administered per MD order (See eMAR) Encouragement and support provided. PRN medication administered per MD order for c/o pian.   R:Safety maintained. Compliant with medication regimen. Will continue to monitor.

## 2015-09-28 NOTE — Progress Notes (Signed)
Patient resting in his room at the beginning of the shift. He endorsed a good day overall. Denied SI/HI and denied Hallucinations. Mood and affects flat and depressed. He answered most assessment question with yes and no. Writer encouraged and supported patient. Q 15 minute check continues as ordered for safety.
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
## 2015-09-28 NOTE — BHH Group Notes (Signed)
[place] LCSW Group Therapy  09/28/2015   11:00 AM   Type of Therapy:  Group Therapy  Participation Level:  Minimal  Participation Quality:  Appropriate and Attentive  Affect:  Flat  Cognitive:  Alert and Appropriate  Insight:  Developing/Improving and Engaged  Engagement in Therapy:  Developing/Improving and Engaged  Modes of Intervention:  Clarification, Confrontation, Discussion, Education, Exploration, Limit-setting, Orientation, Problem-solving, Rapport Building, Art therapist, Socialization and Support  Summary of Progress/Problems: The main focus of today's process group was to identify the patient's current support system and decide on other supports that can be put in place.  An emphasis was placed on using counselor, doctor, therapy groups, 12-step groups, and problem-specific support groups to expand supports, as well as doing something different than has been done before. Pt shared that church is a big support for him and he plans to continue utilizing church as a positive support when he discharges.  Pt actively participated and was engaged in group discussion.    Regan Lemming, LCSW 09/28/2015 2:23 PM

## 2015-09-28 NOTE — BHH Group Notes (Signed)
[place] Group Notes:  (Nursing/MHT/Case Management/Adjunct)  Date:  09/28/2015  Time: 0930  Type of Therapy:  Nurse Education  Participation Level:  Active  Participation Quality:  Appropriate and Attentive  Affect:  Flat  Cognitive:  Alert and Appropriate  Insight:  Good  Engagement in Group:  Engaged  Modes of Intervention:  Activity, Clarification, Socialization and Support  Summary of Progress/Problems:  [name] [name] 09/28/2015, 12:55 PM

## 2015-09-29 DIAGNOSIS — F3162 Bipolar disorder, current episode mixed, moderate: Principal | ICD-10-CM

## 2015-09-29 LAB — HEMOGLOBIN A1C
Hgb A1c MFr Bld: 5.3 % (ref 4.8–5.6)
Mean Plasma Glucose: 105 mg/dL

## 2015-09-29 NOTE — BHH Group Notes (Signed)
[facility] LCSW Aftercare Discharge Planning Group Note  09/29/2015 8:45 AM  Participation Quality: Alert, Appropriate and Oriented  Mood/Affect: Flat  Depression Rating: 0  Anxiety Rating: 0  Thoughts of Suicide: Pt denies SI/HI  Will you contract for safety? Yes  Current AVH: Pt denies  Plan for Discharge/Comments: Pt attended discharge planning group and actively participated in group. CSW discussed suicide prevention education with the group and encouraged them to discuss discharge planning and any relevant barriers. Pt forwarded little information in group, requested transportation from [facility] to [facility].  Transportation Means: Pt reports access to transportation  Supports: No supports mentioned at this time  Peri Maris, [place] 09/29/2015 1:56 PM
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
## 2015-09-29 NOTE — BHH Group Notes (Signed)
[place] LCSW Group Therapy  09/29/2015 4:16 PM  Type of Therapy:  Group Therapy  Participation Level: Invited, did not attend  Summary of Progress/Problems:  Finding Balance in Life. Today's group focused on defining balance in one's own words, identifying things that can knock one off balance, and exploring healthy ways to maintain balance in life. Group members were asked to provide an example of a time when they felt off balance, describe how they handled that situation, and process healthier ways to regain balance in the future. Group members were asked to share the most important tool for maintaining balance that they learned while at [facility] and how they plan to apply this method after discharge.   Beverely Pace

## 2015-09-29 NOTE — Tx Team (Signed)
Interdisciplinary Treatment Plan Update (Adult) Date: 09/29/2015   Date: 09/29/2015 2:30 PM  Progress in Treatment:  Attending groups: Yes  Participating in groups: Yes  Taking medication as prescribed: Yes  Tolerating medication: Yes  Family/Significant othe contact made: No, Pt declines Patient understands diagnosis: Yes AEB seeking help with depression Discussing patient identified problems/goals with staff: Yes  Medical problems stabilized or resolved: Yes  Denies suicidal/homicidal ideation: Yes Patient has not harmed self or Others: Yes   New problem(s) identified: None identified at this time.   Discharge Plan or Barriers: Pt will return home and follow-up with [facility] in [facility]  Additional comments:  Patient and CSW reviewed pt's identified goals and treatment plan. Patient verbalized understanding and agreed to treatment plan. CSW reviewed [facility] "Discharge Process and Patient Involvement" Form. Pt verbalized understanding of information provided and signed form.   Reason for Continuation of Hospitalization:  Depression Medication stabilization Suicidal ideation Psychotic  Estimated length of stay: 1-3 days  Review of initial/current patient goals per problem list:   1.  Goal(s): Patient will participate in aftercare plan  Met:  Yes  Target date: 3-5 days from date of admission   As evidenced by: Patient will participate within aftercare plan AEB aftercare provider and housing plan at discharge being identified.   09/29/15: Pt will return home and follow-up with [facility].  2.  Goal (s): Patient will exhibit decreased depressive symptoms and suicidal ideations.  Met:  Yes  Target date: 3-5 days from date of admission   As evidenced by: Patient will utilize self rating of depression at 3 or below and demonstrate decreased signs of depression or be deemed stable for discharge by MD.  09/29/15: Pt rates depression at 0/10; denies SI  5.  Goal(s): Patient  will demonstrate decreased signs of psychosis  . Met:  Yes . Target date: 3-5 days from date of admission  . As evidenced by: Patient will demonstrate decreased frequency of AVH or return to baseline function - 09/29/15: Pt denies AVH and does not appear to be responding to internal stimuli  Attendees:  Patient:    Family:    Physician: Dr. Shea Evans, MD  09/29/2015 2:30 PM  Nursing: Lars Pinks, RN Case manager  09/29/2015 2:30 PM  Clinical Social Worker Peri Maris, [place] 09/29/2015 2:30 PM  Other:  09/29/2015 2:30 PM  Clinical: Manuella Ghazi, RN 09/29/2015 2:30 PM  Other: , RN Charge Nurse 09/29/2015 2:30 PM  Other: Hilda Lias, [place], [place] Work [phone]

## 2015-09-29 NOTE — Progress Notes (Signed)
Patient in day room interacting with staff at the beginning of the shift. He reported that he had a good day. Denied SI/Hi and denied Hallucinations. He later came to the window and requested for Ibuprofen for a pay of 4 on a scale of 1-10 with 10 the worst. He asked if he would still be able to receive morphine at 2200. Writer encouraged and supported patient ; told patient when the next prn  morphine would be due. Q 15 minute check continues as ordered for safety.

## 2015-09-29 NOTE — Progress Notes (Signed)
DAR Note: [name] [name] was up and visible on the unit this AM.  He continues to report back pain radiating down his legs.  Morphine given prn with good relief.  He denies any SI/HI or A/V hallucinations.  He attends groups and interacts with peers.  He takes his medications without difficulty.  He completed his self inventory and reports that his depression is 2/10, hopelessness 0/10 and his anxiety is 1/10.  His goal today is "get right meds" and he will accomplish his goal by going to "classes and whatever it takes."  Encouraged continued participation in group and unit activities.  Q 15 minute checks maintained for safety.  We will continue to monitor the progress towards his goals.

## 2015-09-29 NOTE — Progress Notes (Signed)
Adult Psychoeducational Group Note  Date:  09/29/2015 Time:  9:09 PM  Group Topic/Focus:  Wrap-Up Group:   The focus of this group is to help patients review their daily goal of treatment and discuss progress on daily workbooks.  Participation Level:  Active  Participation Quality:  Appropriate  Affect:  Appropriate  Cognitive:  Appropriate  Insight: Appropriate  Engagement in Group:  Engaged  Modes of Intervention:  Discussion  Additional Comments: The patient expressed that he attended group.  Nash Shearer 09/29/2015, 9:09 PM

## 2015-09-30 MED ORDER — LISINOPRIL 10 MG PO TABS: 10.0000 mg | ORAL_TABLET | Freq: Every day | ORAL | Status: DC

## 2015-09-30 MED ORDER — RISPERIDONE 0.5 MG PO TABS: 0.5000 mg | ORAL_TABLET | Freq: Two times a day (BID) | ORAL | Status: DC

## 2015-09-30 MED ORDER — DIVALPROEX SODIUM ER 250 MG PO TB24: 750.0000 mg | ORAL_TABLET | Freq: Every day | ORAL | Status: DC

## 2015-09-30 MED ORDER — SIMVASTATIN 20 MG PO TABS: 20.0000 mg | ORAL_TABLET | Freq: Every day | ORAL | Status: DC

## 2015-09-30 MED ORDER — TRAZODONE HCL 100 MG PO TABS: 100.0000 mg | ORAL_TABLET | Freq: Every day | ORAL | Status: DC

## 2015-09-30 MED ORDER — PANTOPRAZOLE SODIUM 40 MG PO TBEC: 40.0000 mg | DELAYED_RELEASE_TABLET | Freq: Every day | ORAL | Status: DC

## 2015-09-30 MED ORDER — BENZTROPINE MESYLATE 0.5 MG PO TABS: 0.5000 mg | ORAL_TABLET | Freq: Two times a day (BID) | ORAL | Status: DC

## 2015-09-30 NOTE — Progress Notes (Signed)
  [facility] Adult Case Management Discharge Plan :  Will you be returning to the same living situation after discharge:  Yes,  Pt returning home At discharge, do you have transportation home?: Yes,  Pt provided with taxi fare. Do you have the ability to pay for your medications: Yes,  Pt provided with prescriptions  Release of information consent forms completed and in the chart;  Patient's signature needed at discharge.  Patient to Follow up at: Follow-up Information    Follow up with Tamela Gammon On 10/03/2015.   Why:  Please go between 8am-11am for your hospital discharge appointment. Please bring proof of income and your social security card.   Contact information:   [place], [place], [place] [address] [place], [place] [address] Phone: [phone] Fax: [phone]      Next level of care provider has access to [place] and Suicide Prevention discussed: Yes,  with Pt; declined family contact  Have you used any form of tobacco in the last 30 days? (Cigarettes, Smokeless Tobacco, Cigars, and/or Pipes): No  Has patient been referred to the Quitline?: N/A patient is not a smoker  Patient has been referred for addiction treatment: Yes- see above  Bo Mcclintock 09/30/2015, 10:07 AM

## 2015-09-30 NOTE — BHH Group Notes (Signed)
[facility] Group Notes:  (Nursing/MHT/Case Management/Adjunct)  Date:  09/30/2015  Time:  9:30am  Type of Therapy:  Nurse Education  Participation Level:  Active  Participation Quality:  Attentive  Affect:  Appropriate  Cognitive:  Alert  Insight:  Limited  Engagement in Group:  Improving and Supportive  Modes of Intervention:  Discussion and Education  Summary of Progress/Problems:  Group topic today was recovery.  Discussed what recovery means to the group.  Discussed long term and short term goals.  Reviewed sleep hygiene.  [name] [name] agreed to comments made in group and reported that he was excited about leaving today.

## 2015-09-30 NOTE — BHH Suicide Risk Assessment (Signed)
[facility] Discharge Suicide Risk Assessment   Principal Problem: Bipolar 1 disorder, mixed, moderate ([place])- acute phase resolved Discharge Diagnoses:  Patient Active Problem List   Diagnosis Date Noted  . Hyperprolactinemia ([place]) [E22.1] 03/14/2015  . Bipolar 1 disorder, mixed, moderate ([place]) [F31.62] 03/12/2015  . Opiate dependence, continuous ([place]) [F11.20] 03/12/2015  . Cocaine use disorder, moderate, in sustained remission [F14.90] 03/12/2015  . Acute renal failure ([place]) [N17.9] 02/17/2012  . Rhabdomyolysis [M62.82] 02/13/2012  . Dehydration [E86.0] 02/13/2012  . Hyponatremia [E87.1] 02/13/2012  . OVERWEIGHT [E66.3] 01/24/2009  . ARACHNOIDITIS [G03.9] 09/27/2008  . MALAISE AND FATIGUE [R53.81, R53.83] 09/27/2008  . HYPERLIPIDEMIA [E78.5] 07/01/2008  . BIPOLAR AFFECTIVE DISORDER [F31.9] 07/01/2008  . MIGRAINE HEADACHE [G43.909] 07/01/2008  . GERD [K21.9] 07/01/2008  . ARTHRITIS [M12.9] 07/01/2008  . DEGENERATIVE DISC DISEASE [IMO0002] 07/01/2008  . LOW BACK PAIN, CHRONIC [M54.5] 07/01/2008    Total Time spent with patient: 30 minutes  Musculoskeletal: Strength & Muscle Tone: within normal limits Gait & Station: normal Patient leans: N/A  Psychiatric Specialty Exam: Review of Systems  Psychiatric/Behavioral: Negative for depression, suicidal ideas and hallucinations. The patient is not nervous/anxious.   All other systems reviewed and are negative.   Blood pressure 126/89, pulse 90, temperature 97.3 F (36.3 C), temperature source Oral, resp. rate 18, height 5' 5.5" (1.664 m), weight 93.895 kg (207 lb).Body mass index is 33.91 kg/(m^2).  General Appearance: Fairly Groomed  Engineer, water::  Fair  Speech:  Normal [id number]  Volume:  Normal  Mood:  Euthymic  Affect:  Congruent  Thought Process:  Goal Directed  Orientation:  Full (Time, Place, and Person)  Thought Content:  WDL  Suicidal Thoughts:  No  Homicidal Thoughts:  No  Memory:  Immediate;   Fair Recent;    Fair Remote;   Fair  Judgement:  Fair  Insight:  Fair  Psychomotor Activity:  Normal  Concentration:  Fair  Recall:  [employer] of Knowledge:Fair  Language: Fair  Akathisia:  No  Handed:  Right  AIMS (if indicated):   0  Assets:  Communication Skills Desire for Improvement  Sleep:  Number of Hours: 6.75  Cognition: WNL  ADL's:  Intact   Mental Status Per Nursing Assessment::   On Admission:  NA  Demographic Factors:  Male and Caucasian  Loss Factors: NA  Historical Factors: Impulsivity  Risk Reduction Factors:   Positive therapeutic relationship  Continued Clinical Symptoms:  Previous Psychiatric Diagnoses and Treatments Medical Diagnoses and Treatments/Surgeries  Cognitive Features That Contribute To Risk:  None    Suicide Risk:  Minimal: No identifiable suicidal ideation.  Patients presenting with no risk factors but with morbid ruminations; may be classified as minimal risk based on the severity of the depressive symptoms  Follow-up Information    Follow up with Tamela Gammon On 10/03/2015.   Why:  Please go between 8am-11am for your hospital discharge appointment. Please bring proof of income and your social security card.   Contact information:   [place], [place], [place] [address] [place], [place] [address] Phone: [phone] Fax: [phone]      Plan Of Care/Follow-up recommendations:  Activity:  no restrictions Diet:  regular Tests:  follow up on your Prolactin level Other:  follow up with aftercare  Terrika Zuver, MD 09/30/2015, 9:40 AM

## 2015-09-30 NOTE — Progress Notes (Signed)
[place] has been up and visible on the unit.  Attending groups.  Interacting with peers.  He denies SI/HI or A/V hallucinations.  Sleep was good last night.  He completed his self inventory and reports that his depression and hopelessness are 0/10 and his anxiety is 1/10.  His goal for today is "me" and he will accomplish his goal by "whatever it takes."  Pt. D/C from the unit to cab.  He was pleasant and cooperative.  D/C follow up paperwork reviewed with pt and copy sent as well as prescriptions.  Belongings (from locker # 34-belt, wallet, cell phone, keys, hotel keys, silver pocket knife, charger cord, sneakers and duffle bag) returned to pt. Q 15 min checks maintained until discharge.
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
## 2015-10-02 DIAGNOSIS — F319 Bipolar disorder, unspecified: Secondary | ICD-10-CM | POA: Diagnosis not present

## 2015-10-07 DIAGNOSIS — F319 Bipolar disorder, unspecified: Secondary | ICD-10-CM | POA: Diagnosis not present

## 2015-10-14 DIAGNOSIS — G4733 Obstructive sleep apnea (adult) (pediatric): Secondary | ICD-10-CM | POA: Diagnosis not present

## 2015-10-16 ENCOUNTER — Other Ambulatory Visit (HOSPITAL_COMMUNITY): Payer: Self-pay | Admitting: Pulmonary Disease

## 2015-10-16 ENCOUNTER — Ambulatory Visit (HOSPITAL_COMMUNITY)
Admission: RE | Admit: 2015-10-16 | Discharge: 2015-10-16 | Disposition: A | Discharge status: Home / Self Care | Payer: Commercial Managed Care - HMO | Source: Ambulatory Visit | Attending: Pulmonary Disease | Admitting: Pulmonary Disease

## 2015-10-16 DIAGNOSIS — I10 Essential (primary) hypertension: Secondary | ICD-10-CM | POA: Diagnosis not present

## 2015-10-16 DIAGNOSIS — R05 Cough: Secondary | ICD-10-CM | POA: Insufficient documentation

## 2015-10-16 DIAGNOSIS — J209 Acute bronchitis, unspecified: Secondary | ICD-10-CM | POA: Diagnosis not present

## 2015-10-16 DIAGNOSIS — R059 Cough, unspecified: Secondary | ICD-10-CM

## 2015-10-22 IMAGING — CR DG CHEST 1V PORT
1 series · 1 of 1 positions shown · non-contrast
Comparison: Chest radiograph performed 01/29/2015

CLINICAL DATA: Acute onset of central chest pain. Initial
encounter.

EXAM:
PORTABLE CHEST - 1 VIEW

[ap portable]
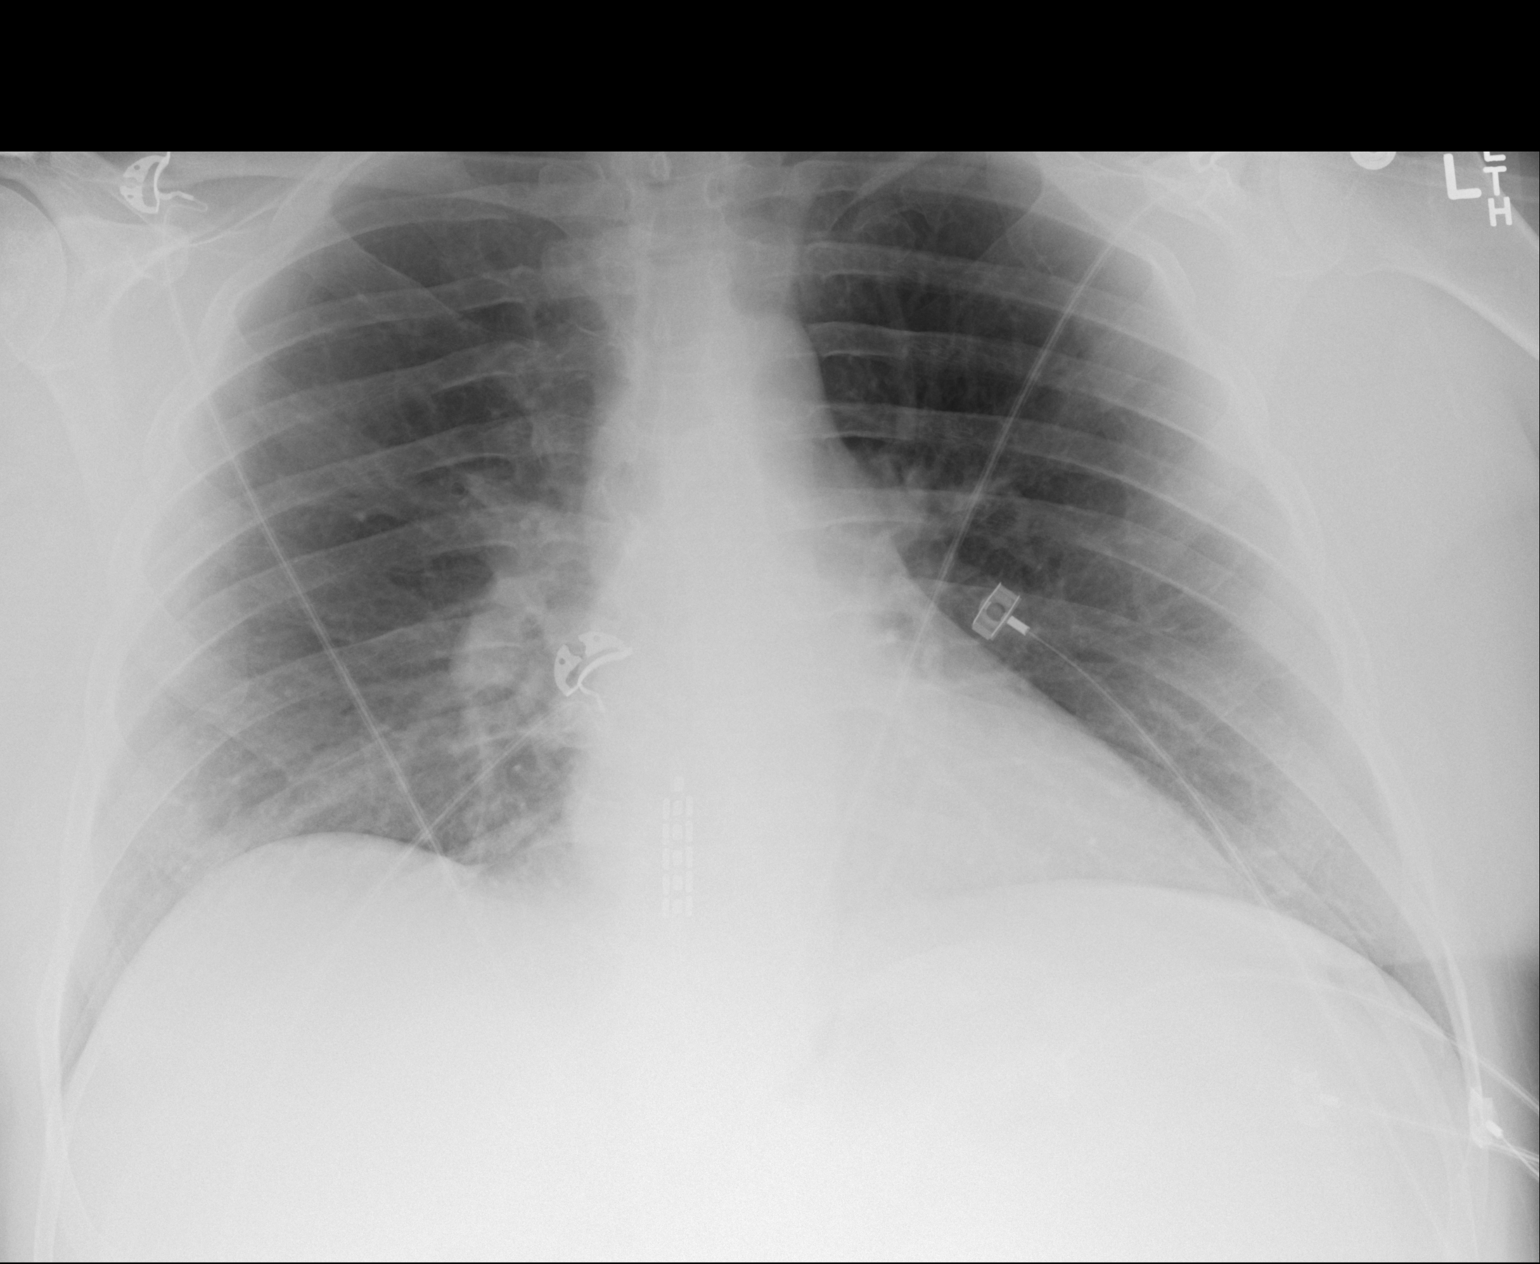

[1 of 1 positions shown; findings below may reference images not displayed]

FINDINGS: The lungs are well-aerated and clear. There is no evidence of focal
opacification, pleural effusion or pneumothorax.

The cardiomediastinal silhouette is normal in size. No acute osseous
abnormalities are seen. Thoracic spinal stimulation leads are
partially imaged.
IMPRESSION: No acute cardiopulmonary process seen.

## 2015-11-03 DIAGNOSIS — F319 Bipolar disorder, unspecified: Secondary | ICD-10-CM | POA: Diagnosis not present

## 2015-11-06 DIAGNOSIS — J209 Acute bronchitis, unspecified: Secondary | ICD-10-CM | POA: Diagnosis not present

## 2015-11-07 DIAGNOSIS — E669 Obesity, unspecified: Secondary | ICD-10-CM | POA: Diagnosis not present

## 2015-11-07 DIAGNOSIS — M549 Dorsalgia, unspecified: Secondary | ICD-10-CM | POA: Diagnosis not present

## 2015-11-07 DIAGNOSIS — J410 Simple chronic bronchitis: Secondary | ICD-10-CM | POA: Diagnosis not present

## 2015-11-07 DIAGNOSIS — E785 Hyperlipidemia, unspecified: Secondary | ICD-10-CM | POA: Diagnosis not present

## 2015-11-07 DIAGNOSIS — Z6833 Body mass index (BMI) 33.0-33.9, adult: Secondary | ICD-10-CM | POA: Diagnosis not present

## 2015-11-07 DIAGNOSIS — I10 Essential (primary) hypertension: Secondary | ICD-10-CM | POA: Diagnosis not present

## 2015-11-07 DIAGNOSIS — J42 Unspecified chronic bronchitis: Secondary | ICD-10-CM | POA: Diagnosis not present

## 2015-11-07 DIAGNOSIS — F331 Major depressive disorder, recurrent, moderate: Secondary | ICD-10-CM | POA: Diagnosis not present

## 2015-11-07 DIAGNOSIS — R51 Headache: Secondary | ICD-10-CM | POA: Diagnosis not present

## 2015-11-12 DIAGNOSIS — R52 Pain, unspecified: Secondary | ICD-10-CM | POA: Diagnosis not present

## 2015-12-01 ENCOUNTER — Ambulatory Visit (INDEPENDENT_AMBULATORY_CARE_PROVIDER_SITE_OTHER): Payer: Commercial Managed Care - HMO | Admitting: Orthopedic Surgery

## 2015-12-01 VITALS — Ht 67.0 in | Wt 245.0 lb

## 2015-12-01 DIAGNOSIS — M25571 Pain in right ankle and joints of right foot: Secondary | ICD-10-CM

## 2015-12-01 DIAGNOSIS — S92001S Unspecified fracture of right calcaneus, sequela: Secondary | ICD-10-CM | POA: Diagnosis not present

## 2015-12-01 DIAGNOSIS — M129 Arthropathy, unspecified: Secondary | ICD-10-CM | POA: Diagnosis not present

## 2015-12-01 DIAGNOSIS — S82841S Displaced bimalleolar fracture of right lower leg, sequela: Secondary | ICD-10-CM | POA: Diagnosis not present

## 2015-12-01 DIAGNOSIS — M19079 Primary osteoarthritis, unspecified ankle and foot: Secondary | ICD-10-CM
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
## 2015-12-01 NOTE — Patient Instructions (Signed)
Take prescription for brace to [facility] # [phone], [address]

## 2015-12-02 DIAGNOSIS — J441 Chronic obstructive pulmonary disease with (acute) exacerbation: Secondary | ICD-10-CM | POA: Diagnosis not present

## 2015-12-02 DIAGNOSIS — G4700 Insomnia, unspecified: Secondary | ICD-10-CM | POA: Diagnosis not present

## 2015-12-02 DIAGNOSIS — I10 Essential (primary) hypertension: Secondary | ICD-10-CM | POA: Diagnosis not present

## 2015-12-02 DIAGNOSIS — K210 Gastro-esophageal reflux disease with esophagitis: Secondary | ICD-10-CM | POA: Diagnosis not present

## 2016-01-01 DIAGNOSIS — G039 Meningitis, unspecified: Secondary | ICD-10-CM | POA: Diagnosis not present

## 2016-01-01 DIAGNOSIS — R52 Pain, unspecified: Secondary | ICD-10-CM | POA: Diagnosis not present

## 2016-01-01 DIAGNOSIS — S32009A Unspecified fracture of unspecified lumbar vertebra, initial encounter for closed fracture: Secondary | ICD-10-CM | POA: Diagnosis not present

## 2016-02-02 DIAGNOSIS — I10 Essential (primary) hypertension: Secondary | ICD-10-CM | POA: Diagnosis not present

## 2016-02-02 DIAGNOSIS — G4700 Insomnia, unspecified: Secondary | ICD-10-CM | POA: Diagnosis not present

## 2016-02-02 DIAGNOSIS — J449 Chronic obstructive pulmonary disease, unspecified: Secondary | ICD-10-CM | POA: Diagnosis not present

## 2016-02-02 DIAGNOSIS — K210 Gastro-esophageal reflux disease with esophagitis: Secondary | ICD-10-CM | POA: Diagnosis not present

## 2016-02-06 DIAGNOSIS — J410 Simple chronic bronchitis: Secondary | ICD-10-CM | POA: Diagnosis not present

## 2016-02-06 DIAGNOSIS — F331 Major depressive disorder, recurrent, moderate: Secondary | ICD-10-CM | POA: Diagnosis not present

## 2016-02-06 DIAGNOSIS — F3132 Bipolar disorder, current episode depressed, moderate: Secondary | ICD-10-CM | POA: Diagnosis not present

## 2016-02-06 DIAGNOSIS — I10 Essential (primary) hypertension: Secondary | ICD-10-CM | POA: Diagnosis not present

## 2016-03-05 DIAGNOSIS — F319 Bipolar disorder, unspecified: Secondary | ICD-10-CM | POA: Diagnosis not present

## 2016-03-31 DIAGNOSIS — J441 Chronic obstructive pulmonary disease with (acute) exacerbation: Secondary | ICD-10-CM | POA: Diagnosis not present

## 2016-03-31 DIAGNOSIS — G4733 Obstructive sleep apnea (adult) (pediatric): Secondary | ICD-10-CM | POA: Diagnosis not present

## 2016-04-21 DIAGNOSIS — G039 Meningitis, unspecified: Secondary | ICD-10-CM | POA: Diagnosis not present

## 2016-04-21 DIAGNOSIS — R52 Pain, unspecified: Secondary | ICD-10-CM | POA: Diagnosis not present

## 2016-04-21 DIAGNOSIS — S32009A Unspecified fracture of unspecified lumbar vertebra, initial encounter for closed fracture: Secondary | ICD-10-CM | POA: Diagnosis not present

## 2016-05-04 DIAGNOSIS — G4733 Obstructive sleep apnea (adult) (pediatric): Secondary | ICD-10-CM | POA: Diagnosis not present

## 2016-05-04 DIAGNOSIS — I10 Essential (primary) hypertension: Secondary | ICD-10-CM | POA: Diagnosis not present

## 2016-05-04 DIAGNOSIS — Z23 Encounter for immunization: Secondary | ICD-10-CM | POA: Diagnosis not present

## 2016-05-04 DIAGNOSIS — J449 Chronic obstructive pulmonary disease, unspecified: Secondary | ICD-10-CM | POA: Diagnosis not present

## 2016-05-07 DIAGNOSIS — F331 Major depressive disorder, recurrent, moderate: Secondary | ICD-10-CM | POA: Diagnosis not present

## 2016-05-07 DIAGNOSIS — I10 Essential (primary) hypertension: Secondary | ICD-10-CM | POA: Diagnosis not present

## 2016-05-07 DIAGNOSIS — E785 Hyperlipidemia, unspecified: Secondary | ICD-10-CM | POA: Diagnosis not present

## 2016-05-07 DIAGNOSIS — J410 Simple chronic bronchitis: Secondary | ICD-10-CM | POA: Diagnosis not present

## 2016-07-20 DIAGNOSIS — R52 Pain, unspecified: Secondary | ICD-10-CM | POA: Diagnosis not present

## 2016-07-20 DIAGNOSIS — S32009A Unspecified fracture of unspecified lumbar vertebra, initial encounter for closed fracture: Secondary | ICD-10-CM | POA: Diagnosis not present

## 2016-08-05 DIAGNOSIS — G4733 Obstructive sleep apnea (adult) (pediatric): Secondary | ICD-10-CM | POA: Diagnosis not present

## 2016-08-05 DIAGNOSIS — I10 Essential (primary) hypertension: Secondary | ICD-10-CM | POA: Diagnosis not present

## 2016-08-05 DIAGNOSIS — K210 Gastro-esophageal reflux disease with esophagitis: Secondary | ICD-10-CM | POA: Diagnosis not present

## 2016-08-05 DIAGNOSIS — J449 Chronic obstructive pulmonary disease, unspecified: Secondary | ICD-10-CM | POA: Diagnosis not present

## 2016-08-09 DIAGNOSIS — J410 Simple chronic bronchitis: Secondary | ICD-10-CM | POA: Diagnosis not present

## 2016-08-09 DIAGNOSIS — M549 Dorsalgia, unspecified: Secondary | ICD-10-CM | POA: Diagnosis not present

## 2016-08-09 DIAGNOSIS — I10 Essential (primary) hypertension: Secondary | ICD-10-CM | POA: Diagnosis not present

## 2016-08-17 ENCOUNTER — Other Ambulatory Visit (HOSPITAL_BASED_OUTPATIENT_CLINIC_OR_DEPARTMENT_OTHER): Payer: Self-pay

## 2016-08-17 DIAGNOSIS — G4730 Sleep apnea, unspecified: Secondary | ICD-10-CM

## 2016-08-29 ENCOUNTER — Ambulatory Visit: Payer: Medicare HMO | Attending: Pulmonary Disease | Admitting: Neurology

## 2016-08-29 DIAGNOSIS — G4733 Obstructive sleep apnea (adult) (pediatric): Secondary | ICD-10-CM | POA: Insufficient documentation

## 2016-08-29 DIAGNOSIS — Z79899 Other long term (current) drug therapy: Secondary | ICD-10-CM | POA: Insufficient documentation

## 2016-08-29 DIAGNOSIS — G4730 Sleep apnea, unspecified: Secondary | ICD-10-CM

## 2016-09-04 NOTE — Procedures (Signed)
Proctor A. Merlene Laughter, MD     [email or web address]             NOCTURNAL POLYSOMNOGRAPHY   LOCATION: [facility]   Patient Name: [name] [name], [name] [name] Date: 08/29/2016 Gender: Male D.O.B: 1966/07/17 Age (years): 53 Referring Provider: Lennox Solders Height (inches): 54 Interpreting Physician: Phillips Odor MD, ABSM Weight (lbs): 245 RPSGT: Rosebud Poles BMI: 38 MRN: Neck Size: 17.00 CLINICAL INFORMATION Sleep Study Type: Split Night CPAP  Indication for sleep study: N/A  Epworth Sleepiness Score: 17  SLEEP STUDY TECHNIQUE As per the AASM Manual for the Scoring of Sleep and Associated Events v2.3 (April 2016) with a hypopnea requiring 4% desaturations.  The channels recorded and monitored were frontal, central and occipital EEG, electrooculogram (EOG), submentalis EMG (chin), nasal and oral airflow, thoracic and abdominal wall motion, anterior tibialis EMG, snore microphone, electrocardiogram, and pulse oximetry. Continuous positive airway pressure (CPAP) was initiated when the patient met split night criteria and was titrated according to treat sleep-disordered breathing.  MEDICATIONS Medications self-administered by patient taken the night of the study : N/A  Current Outpatient Prescriptions:  .  benztropine (COGENTIN) 0.5 MG tablet, Take 1 tablet (0.5 mg total) by mouth 2 (two) times daily., Disp: 60 tablet, Rfl: 0 .  cyclobenzaprine (FLEXERIL) 10 MG tablet, , Disp: , Rfl:  .  divalproex (DEPAKOTE ER) 250 MG 24 hr tablet, Take 3 tablets (750 mg total) by mouth at bedtime., Disp: 90 tablet, Rfl: 0 .  lisinopril (PRINIVIL,ZESTRIL) 10 MG tablet, Take 1 tablet (10 mg total) by mouth daily., Disp: 30 tablet, Rfl: 0 .  methadone (DOLOPHINE) 10 MG tablet, , Disp: , Rfl:  .  omeprazole (PRILOSEC) 20 MG capsule, , Disp: , Rfl:  .  oxyCODONE-acetaminophen (PERCOCET/ROXICET) 5-325 MG tablet, , Disp: , Rfl:  .  risperiDONE (RISPERDAL) 0.5 MG tablet, Take 1  tablet (0.5 mg total) by mouth 2 (two) times daily., Disp: 60 tablet, Rfl: 0 .  simvastatin (ZOCOR) 20 MG tablet, Take 1 tablet (20 mg total) by mouth daily at 6 PM., Disp: 30 tablet, Rfl: 0 .  traZODone (DESYREL) 100 MG tablet, Take 1 tablet (100 mg total) by mouth at bedtime., Disp: 30 tablet, Rfl: 0   RESPIRATORY PARAMETERS Diagnostic  Total AHI (/hr): 32.8 RDI (/hr): 33.2 OA Index (/hr): 2.9 CA Index (/hr): 4.0 REM AHI (/hr): N/A NREM AHI (/hr): 32.8 Supine AHI (/hr): 31.4 Non-supine AHI (/hr): 40.80 Min O2 Sat (%): 85.00 Mean O2 (%): 90.50 Time below 88% (min): 24.8     Titration  Optimal Pressure (cm): 9 AHI at Optimal Pressure (/hr): 0 Min O2 at Optimal Pressure (%): 89.00 Supine % at Optimal (%): N/A Sleep % at Optimal (%): N/A     SLEEP ARCHITECTURE The recording time for the entire night was 428.9 minutes.  During a baseline period of 242.8 minutes, the patient slept for 166.5 minutes in REM and nonREM, yielding a sleep efficiency of 68.6%. Sleep onset after lights out was 62.3 minutes with a REM latency of N/A minutes. The patient spent 8.41% of the night in stage N1 sleep, 69.67% in stage N2 sleep, 21.92% in stage N3 and 0.00% in REM.  During the titration period of 174.3 minutes, the patient slept for 116.6 minutes in REM and nonREM, yielding a sleep efficiency of 66.9%. Sleep onset after CPAP initiation was 35.7 minutes with a REM latency of N/A minutes. The patient spent 11.58% of the night in stage N1 sleep, 78.56% in stage N2 sleep,  9.86% in stage N3 and 0.00% in REM.  CARDIAC DATA The 2 lead EKG demonstrated sinus rhythm. The mean heart rate was N/A beats per minute. Other EKG findings include: PVCs. LEG MOVEMENT DATA The total Periodic Limb Movements of Sleep (PLMS) were 0. The PLMS index was 0.00.  IMPRESSIONS - Severe obstructive sleep apnea occurred during the diagnostic portion of the study (AHI = 32.8/hour). An optimal CPAP 9 was selected for this patient based on  the available study data.   Delano Metz, MD Diplomate, American Board of Sleep Medicine.

## 2016-09-20 ENCOUNTER — Emergency Department (HOSPITAL_COMMUNITY)
Admission: EM | Admit: 2016-09-20 | Discharge: 2016-09-20 | Disposition: A | Discharge status: Home / Self Care | Payer: Medicare HMO | Attending: Emergency Medicine | Admitting: Emergency Medicine

## 2016-09-20 ENCOUNTER — Encounter (HOSPITAL_COMMUNITY): Payer: Self-pay | Admitting: *Deleted

## 2016-09-20 DIAGNOSIS — Z79891 Long term (current) use of opiate analgesic: Secondary | ICD-10-CM

## 2016-09-20 DIAGNOSIS — Z91199 Patient's noncompliance with other medical treatment and regimen due to unspecified reason: Secondary | ICD-10-CM

## 2016-09-20 DIAGNOSIS — R443 Hallucinations, unspecified: Secondary | ICD-10-CM | POA: Diagnosis present

## 2016-09-20 DIAGNOSIS — Z9114 Patient's other noncompliance with medication regimen: Secondary | ICD-10-CM | POA: Insufficient documentation

## 2016-09-20 DIAGNOSIS — Z87891 Personal history of nicotine dependence: Secondary | ICD-10-CM | POA: Insufficient documentation

## 2016-09-20 DIAGNOSIS — R440 Auditory hallucinations: Secondary | ICD-10-CM | POA: Diagnosis not present

## 2016-09-20 DIAGNOSIS — F3162 Bipolar disorder, current episode mixed, moderate: Secondary | ICD-10-CM

## 2016-09-20 DIAGNOSIS — Z811 Family history of alcohol abuse and dependence: Secondary | ICD-10-CM

## 2016-09-20 DIAGNOSIS — F1910 Other psychoactive substance abuse, uncomplicated: Secondary | ICD-10-CM | POA: Insufficient documentation

## 2016-09-20 DIAGNOSIS — Z9119 Patient's noncompliance with other medical treatment and regimen: Secondary | ICD-10-CM

## 2016-09-20 DIAGNOSIS — F319 Bipolar disorder, unspecified: Secondary | ICD-10-CM

## 2016-09-20 DIAGNOSIS — F1994 Other psychoactive substance use, unspecified with psychoactive substance-induced mood disorder: Secondary | ICD-10-CM | POA: Diagnosis not present

## 2016-09-20 DIAGNOSIS — F99 Mental disorder, not otherwise specified: Secondary | ICD-10-CM | POA: Diagnosis not present

## 2016-09-20 DIAGNOSIS — Z79899 Other long term (current) drug therapy: Secondary | ICD-10-CM | POA: Diagnosis not present

## 2016-09-20 LAB — COMPREHENSIVE METABOLIC PANEL
ALT: 44 U/L (ref 17–63)
AST: 24 U/L (ref 15–41)
Albumin: 4.3 g/dL (ref 3.5–5.0)
Alkaline Phosphatase: 70 U/L (ref 38–126)
Anion gap: 11 (ref 5–15)
BUN: 7 mg/dL (ref 6–20)
CHLORIDE: 100 mmol/L — AB (ref 101–111)
CO2: 22 mmol/L (ref 22–32)
Calcium: 9 mg/dL (ref 8.9–10.3)
Creatinine, Ser: 0.98 mg/dL (ref 0.61–1.24)
Glucose, Bld: 116 mg/dL — ABNORMAL HIGH (ref 65–99)
POTASSIUM: 3.3 mmol/L — AB (ref 3.5–5.1)
SODIUM: 133 mmol/L — AB (ref 135–145)
Total Bilirubin: 0.5 mg/dL (ref 0.3–1.2)
Total Protein: 7.2 g/dL (ref 6.5–8.1)

## 2016-09-20 LAB — CBC
HCT: 45.8 % (ref 39.0–52.0)
HEMOGLOBIN: 16.8 g/dL (ref 13.0–17.0)
MCH: 30.9 pg (ref 26.0–34.0)
MCHC: 36.7 g/dL — AB (ref 30.0–36.0)
MCV: 84.2 fL (ref 78.0–100.0)
Platelets: 283 10*3/uL (ref 150–400)
RBC: 5.44 MIL/uL (ref 4.22–5.81)
RDW: 12.7 % (ref 11.5–15.5)
WBC: 9.4 10*3/uL (ref 4.0–10.5)

## 2016-09-20 LAB — RAPID URINE DRUG SCREEN, HOSP PERFORMED
Amphetamines: NOT DETECTED
BENZODIAZEPINES: POSITIVE — AB
Barbiturates: NOT DETECTED
COCAINE: POSITIVE — AB
OPIATES: POSITIVE — AB
TETRAHYDROCANNABINOL: POSITIVE — AB

## 2016-09-20 LAB — ACETAMINOPHEN LEVEL
ACETAMINOPHEN (TYLENOL), SERUM: 12 ug/mL (ref 10–30)
Acetaminophen (Tylenol), Serum: <10 ug/mL — ABNORMAL LOW (ref 10–30)

## 2016-09-20 LAB — ETHANOL

## 2016-09-20 LAB — SALICYLATE LEVEL: Salicylate Lvl: <7 mg/dL (ref 2.8–30.0)

## 2016-09-20 MED ORDER — RISPERIDONE 0.5 MG PO TABS
0.5000 mg | ORAL_TABLET | Freq: Two times a day (BID) | ORAL | Status: DC
  Administered 2016-09-20 (×2): 0.5 mg via ORAL
  Filled 2016-09-20 (×5): qty 1

## 2016-09-20 MED ORDER — UMECLIDINIUM-VILANTEROL 62.5-25 MCG/INH IN AEPB
1.0000 | INHALATION_SPRAY | Freq: Every day | RESPIRATORY_TRACT | Status: DC
  Administered 2016-09-20: 1 via RESPIRATORY_TRACT

## 2016-09-20 MED ORDER — LISINOPRIL 10 MG PO TABS
10.0000 mg | ORAL_TABLET | Freq: Every day | ORAL | Status: DC
  Administered 2016-09-20 (×2): 10 mg via ORAL
  Filled 2016-09-20 (×2): qty 1

## 2016-09-20 MED ORDER — METHADONE HCL 10 MG PO TABS
20.0000 mg | ORAL_TABLET | Freq: Two times a day (BID) | ORAL | Status: DC
  Administered 2016-09-20: 20 mg via ORAL
  Filled 2016-09-20: qty 2

## 2016-09-20 MED ORDER — DIVALPROEX SODIUM ER 500 MG PO TB24
750.0000 mg | ORAL_TABLET | Freq: Every day | ORAL | Status: DC
  Filled 2016-09-20 (×2): qty 1

## 2016-09-20 MED ORDER — TRAZODONE HCL 50 MG PO TABS: 100.0000 mg | ORAL_TABLET | Freq: Every day | ORAL | Status: DC

## 2016-09-20 MED ORDER — RISPERIDONE 0.5 MG PO TABS
ORAL_TABLET | ORAL | Status: AC
  Filled 2016-09-20: qty 1

## 2016-09-20 MED ORDER — BENZTROPINE MESYLATE 1 MG PO TABS
0.5000 mg | ORAL_TABLET | Freq: Two times a day (BID) | ORAL | Status: DC
  Administered 2016-09-20 (×2): 0.5 mg via ORAL
  Filled 2016-09-20 (×2): qty 1

## 2016-09-20 MED ORDER — SIMVASTATIN 10 MG PO TABS: 20.0000 mg | ORAL_TABLET | Freq: Every day | ORAL | Status: DC

## 2016-09-20 MED ORDER — PANTOPRAZOLE SODIUM 40 MG PO TBEC
40.0000 mg | DELAYED_RELEASE_TABLET | Freq: Every day | ORAL | Status: DC
  Administered 2016-09-20: 40 mg via ORAL
  Filled 2016-09-20: qty 1

## 2016-09-20 NOTE — B[facility] Counselor (Addendum)
[name] Romp, NP changed pt's disposition to AM Psychiatric Evaluation. Updated disposition discussed with Ginger, RN. Per Ginger, RN she would inform Dr. Christy Gentles of pt's updated disposition.   Edd Fabian, MS, [facility], [facility] Triage Specialist [phone]

## 2016-09-20 NOTE — ED Provider Notes (Signed)
Plan at signout to f/u on APAP level and repeat psych exam   Ripley Fraise, MD 09/20/16 [phone]
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
## 2016-09-20 NOTE — BH Assessment (Signed)
Clinician spoke to [place] and expressed she attempted to complete the pt's TTS consult however she is receiving the following message: "Could not be reached, please try again later." Pt asked Marjorie Smolder if she could ask the RN is the cart is plugged in and turned on. Clinician reported she would call the cart again in about five minutes.   Edd Fabian, MS, [facility], [facility] Triage Specialist [phone]
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
## 2016-09-20 NOTE — ED Notes (Signed)
Per Dr Christy Gentles pt does not need a sitter because he is denying SI or HI.

## 2016-09-20 NOTE — ED Triage Notes (Signed)
Pt states that he missed his appointment at [facility] to have his prescription filled and has been out of his medication, pt admits to hearing voices that are telling him that they will kill him, pt alert cooperative, denies any SI or HI.

## 2016-09-21 ENCOUNTER — Emergency Department (HOSPITAL_COMMUNITY)
Admission: EM | Admit: 2016-09-21 | Discharge: 2016-09-22 | Disposition: A | Discharge status: Home / Self Care | Payer: Medicare HMO | Attending: Emergency Medicine | Admitting: Emergency Medicine

## 2016-09-21 ENCOUNTER — Emergency Department (HOSPITAL_COMMUNITY): Payer: Medicare HMO

## 2016-09-21 ENCOUNTER — Encounter (HOSPITAL_COMMUNITY): Payer: Self-pay | Admitting: Emergency Medicine

## 2016-09-21 DIAGNOSIS — T402X5A Adverse effect of other opioids, initial encounter: Secondary | ICD-10-CM | POA: Diagnosis not present

## 2016-09-21 DIAGNOSIS — R1084 Generalized abdominal pain: Secondary | ICD-10-CM | POA: Insufficient documentation

## 2016-09-21 DIAGNOSIS — F1490 Cocaine use, unspecified, uncomplicated: Secondary | ICD-10-CM | POA: Diagnosis not present

## 2016-09-21 DIAGNOSIS — R440 Auditory hallucinations: Secondary | ICD-10-CM

## 2016-09-21 DIAGNOSIS — Z87891 Personal history of nicotine dependence: Secondary | ICD-10-CM | POA: Insufficient documentation

## 2016-09-21 DIAGNOSIS — K5900 Constipation, unspecified: Secondary | ICD-10-CM | POA: Diagnosis present

## 2016-09-21 DIAGNOSIS — R1110 Vomiting, unspecified: Secondary | ICD-10-CM | POA: Insufficient documentation

## 2016-09-21 DIAGNOSIS — Z79899 Other long term (current) drug therapy: Secondary | ICD-10-CM | POA: Diagnosis not present

## 2016-09-21 DIAGNOSIS — K5903 Drug induced constipation: Secondary | ICD-10-CM | POA: Diagnosis not present

## 2016-09-21 DIAGNOSIS — R109 Unspecified abdominal pain: Secondary | ICD-10-CM | POA: Diagnosis not present

## 2016-09-21 LAB — CBC WITH DIFFERENTIAL/PLATELET
BASOS PCT: 0 %
Basophils Absolute: 0 10*3/uL (ref 0.0–0.1)
EOS ABS: 0 10*3/uL (ref 0.0–0.7)
Eosinophils Relative: 0 %
HCT: 48.3 % (ref 39.0–52.0)
HEMOGLOBIN: 17.5 g/dL — AB (ref 13.0–17.0)
Lymphocytes Relative: 26 %
Lymphs Abs: 3.8 10*3/uL (ref 0.7–4.0)
MCH: 30.6 pg (ref 26.0–34.0)
MCHC: 36.2 g/dL — AB (ref 30.0–36.0)
MCV: 84.6 fL (ref 78.0–100.0)
Monocytes Absolute: 1 10*3/uL (ref 0.1–1.0)
Monocytes Relative: 7 %
Neutro Abs: 9.6 10*3/uL — ABNORMAL HIGH (ref 1.7–7.7)
Neutrophils Relative %: 67 %
Platelets: 275 10*3/uL (ref 150–400)
RBC: 5.71 MIL/uL (ref 4.22–5.81)
RDW: 12.9 % (ref 11.5–15.5)
WBC: 14.4 10*3/uL — AB (ref 4.0–10.5)

## 2016-09-21 LAB — COMPREHENSIVE METABOLIC PANEL
ALBUMIN: 4.9 g/dL (ref 3.5–5.0)
ALK PHOS: 70 U/L (ref 38–126)
ALT: 40 U/L (ref 17–63)
ANION GAP: 12 (ref 5–15)
AST: 25 U/L (ref 15–41)
BUN: 11 mg/dL (ref 6–20)
CO2: 25 mmol/L (ref 22–32)
Calcium: 9.5 mg/dL (ref 8.9–10.3)
Chloride: 99 mmol/L — ABNORMAL LOW (ref 101–111)
Creatinine, Ser: 1.27 mg/dL — ABNORMAL HIGH (ref 0.61–1.24)
GFR calc Af Amer: >60 mL/min (ref >60)
GFR calc non Af Amer: >60 mL/min (ref >60)
GLUCOSE: 104 mg/dL — AB (ref 65–99)
POTASSIUM: 3.6 mmol/L (ref 3.5–5.1)
SODIUM: 136 mmol/L (ref 135–145)
Total Bilirubin: 0.9 mg/dL (ref 0.3–1.2)
Total Protein: 7.8 g/dL (ref 6.5–8.1)

## 2016-09-21 LAB — RAPID URINE DRUG SCREEN, HOSP PERFORMED
AMPHETAMINES: NOT DETECTED
BENZODIAZEPINES: NOT DETECTED
Barbiturates: NOT DETECTED
COCAINE: POSITIVE — AB
OPIATES: NOT DETECTED
TETRAHYDROCANNABINOL: POSITIVE — AB

## 2016-09-21 LAB — LIPASE, BLOOD: Lipase: <10 U/L — ABNORMAL LOW (ref 11–51)

## 2016-09-21 LAB — ETHANOL: Alcohol, Ethyl (B): <5 mg/dL (ref <5)

## 2016-09-21 NOTE — ED Triage Notes (Signed)
Patient complains of constipation x 7 days since last BM.
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
## 2016-09-22 DIAGNOSIS — R109 Unspecified abdominal pain: Secondary | ICD-10-CM | POA: Diagnosis not present

## 2016-09-22 MED ORDER — RISPERIDONE 0.5 MG PO TABS
ORAL_TABLET | ORAL | Status: AC
  Filled 2016-09-22: qty 1

## 2016-09-22 MED ORDER — DOCUSATE SODIUM 100 MG PO CAPS: 100.0000 mg | ORAL_CAPSULE | Freq: Every day | ORAL | 0 refills | Status: DC

## 2016-09-22 MED ORDER — POLYETHYLENE GLYCOL 3350 17 G PO PACK: 17.0000 g | PACK | Freq: Every day | ORAL | 0 refills | Status: DC

## 2016-09-22 MED ORDER — BENZTROPINE MESYLATE 0.5 MG PO TABS: 0.5000 mg | ORAL_TABLET | Freq: Two times a day (BID) | ORAL | 0 refills | Status: DC

## 2016-09-22 MED ORDER — RISPERIDONE 0.5 MG PO TABS: 0.5000 mg | ORAL_TABLET | Freq: Two times a day (BID) | ORAL | 0 refills | Status: DC

## 2016-09-22 MED ORDER — RISPERIDONE 0.5 MG PO TABS
0.5000 mg | ORAL_TABLET | ORAL | Status: AC
  Administered 2016-09-22: 0.5 mg via ORAL
  Filled 2016-09-22: qty 1

## 2016-09-22 MED ORDER — POLYETHYLENE GLYCOL 3350 17 G PO PACK: 17.0000 g | PACK | Freq: Once | ORAL | Status: DC

## 2016-09-23 ENCOUNTER — Emergency Department (HOSPITAL_COMMUNITY)
Admission: EM | Admit: 2016-09-23 | Discharge: 2016-09-23 | Disposition: A | Discharge status: Home / Self Care | Payer: Medicare HMO | Attending: Emergency Medicine | Admitting: Emergency Medicine

## 2016-09-23 ENCOUNTER — Encounter (HOSPITAL_COMMUNITY): Payer: Self-pay

## 2016-09-23 DIAGNOSIS — R440 Auditory hallucinations: Secondary | ICD-10-CM

## 2016-09-23 DIAGNOSIS — Z87891 Personal history of nicotine dependence: Secondary | ICD-10-CM | POA: Insufficient documentation

## 2016-09-23 DIAGNOSIS — R1030 Lower abdominal pain, unspecified: Secondary | ICD-10-CM | POA: Insufficient documentation

## 2016-09-23 DIAGNOSIS — Z79899 Other long term (current) drug therapy: Secondary | ICD-10-CM | POA: Insufficient documentation

## 2016-09-23 DIAGNOSIS — F319 Bipolar disorder, unspecified: Secondary | ICD-10-CM | POA: Diagnosis not present

## 2016-09-23 LAB — CBC WITH DIFFERENTIAL/PLATELET
Basophils Absolute: 0.1 10*3/uL (ref 0.0–0.1)
Basophils Relative: 1 %
EOS PCT: 1 %
Eosinophils Absolute: 0.1 10*3/uL (ref 0.0–0.7)
HCT: 44.1 % (ref 39.0–52.0)
Hemoglobin: 15.7 g/dL (ref 13.0–17.0)
LYMPHS ABS: 2.7 10*3/uL (ref 0.7–4.0)
LYMPHS PCT: 26 %
MCH: 29.7 pg (ref 26.0–34.0)
MCHC: 35.6 g/dL (ref 30.0–36.0)
MCV: 83.5 fL (ref 78.0–100.0)
MONO ABS: 1.1 10*3/uL — AB (ref 0.1–1.0)
Monocytes Relative: 11 %
Neutro Abs: 6.3 10*3/uL (ref 1.7–7.7)
Neutrophils Relative %: 61 %
Platelets: 225 10*3/uL (ref 150–400)
RBC: 5.28 MIL/uL (ref 4.22–5.81)
RDW: 12.7 % (ref 11.5–15.5)
WBC: 10.2 10*3/uL (ref 4.0–10.5)

## 2016-09-23 LAB — COMPREHENSIVE METABOLIC PANEL
ALK PHOS: 60 U/L (ref 38–126)
ALT: 32 U/L (ref 17–63)
AST: 24 U/L (ref 15–41)
Albumin: 4.5 g/dL (ref 3.5–5.0)
Anion gap: 10 (ref 5–15)
BUN: 6 mg/dL (ref 6–20)
CHLORIDE: 95 mmol/L — AB (ref 101–111)
CO2: 25 mmol/L (ref 22–32)
CREATININE: 0.96 mg/dL (ref 0.61–1.24)
Calcium: 9.6 mg/dL (ref 8.9–10.3)
GFR calc Af Amer: >60 mL/min (ref >60)
GFR calc non Af Amer: >60 mL/min (ref >60)
Glucose, Bld: 115 mg/dL — ABNORMAL HIGH (ref 65–99)
Potassium: 3.1 mmol/L — ABNORMAL LOW (ref 3.5–5.1)
Sodium: 130 mmol/L — ABNORMAL LOW (ref 135–145)
Total Bilirubin: 1 mg/dL (ref 0.3–1.2)
Total Protein: 7.3 g/dL (ref 6.5–8.1)

## 2016-09-23 LAB — ETHANOL: Alcohol, Ethyl (B): <5 mg/dL (ref <5)

## 2016-09-23 LAB — RAPID URINE DRUG SCREEN, HOSP PERFORMED
Amphetamines: NOT DETECTED
Barbiturates: NOT DETECTED
Benzodiazepines: NOT DETECTED
Cocaine: NOT DETECTED
Opiates: NOT DETECTED
Tetrahydrocannabinol: NOT DETECTED

## 2016-09-23 MED ORDER — LORAZEPAM 1 MG PO TABS
0.0000 mg | ORAL_TABLET | Freq: Four times a day (QID) | ORAL | Status: DC
  Administered 2016-09-23: 2 mg via ORAL
  Filled 2016-09-23: qty 2

## 2016-09-23 MED ORDER — LORAZEPAM 1 MG PO TABS: 0.0000 mg | ORAL_TABLET | Freq: Two times a day (BID) | ORAL | Status: DC

## 2016-09-23 NOTE — ED Notes (Signed)
Patient's belongings retrieved from locker and returned to patient.
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
## 2016-09-23 NOTE — ED Triage Notes (Signed)
Pt states he is hearing voices "one more grain of cocaine and you're dead"  Pt also states he keeps thinking his ex-wife and her daddy are the voices he is hearing telling him he will be dead.   Pt denies that he has thoughts of hurting himself.

## 2016-09-23 NOTE — Discharge Instructions (Signed)
Go to [facility] today at 1:45 - for evaluation.
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
## 2016-10-12 ENCOUNTER — Encounter (HOSPITAL_COMMUNITY): Payer: Self-pay | Admitting: Emergency Medicine

## 2016-10-12 ENCOUNTER — Emergency Department (HOSPITAL_COMMUNITY)
Admission: EM | Admit: 2016-10-12 | Discharge: 2016-10-13 | Disposition: A | Discharge status: Home / Self Care | Payer: Medicare HMO | Attending: Emergency Medicine | Admitting: Emergency Medicine

## 2016-10-12 DIAGNOSIS — F2081 Schizophreniform disorder: Secondary | ICD-10-CM | POA: Diagnosis not present

## 2016-10-12 DIAGNOSIS — F1910 Other psychoactive substance abuse, uncomplicated: Secondary | ICD-10-CM | POA: Diagnosis not present

## 2016-10-12 DIAGNOSIS — I482 Chronic atrial fibrillation: Secondary | ICD-10-CM | POA: Diagnosis not present

## 2016-10-12 DIAGNOSIS — F99 Mental disorder, not otherwise specified: Secondary | ICD-10-CM | POA: Diagnosis not present

## 2016-10-12 DIAGNOSIS — Z79899 Other long term (current) drug therapy: Secondary | ICD-10-CM | POA: Diagnosis not present

## 2016-10-12 DIAGNOSIS — J449 Chronic obstructive pulmonary disease, unspecified: Secondary | ICD-10-CM | POA: Insufficient documentation

## 2016-10-12 DIAGNOSIS — F1914 Other psychoactive substance abuse with psychoactive substance-induced mood disorder: Secondary | ICD-10-CM | POA: Insufficient documentation

## 2016-10-12 DIAGNOSIS — Z87891 Personal history of nicotine dependence: Secondary | ICD-10-CM | POA: Insufficient documentation

## 2016-10-12 DIAGNOSIS — F3162 Bipolar disorder, current episode mixed, moderate: Secondary | ICD-10-CM | POA: Diagnosis present

## 2016-10-12 DIAGNOSIS — F1994 Other psychoactive substance use, unspecified with psychoactive substance-induced mood disorder: Secondary | ICD-10-CM | POA: Diagnosis not present

## 2016-10-12 DIAGNOSIS — R443 Hallucinations, unspecified: Secondary | ICD-10-CM | POA: Diagnosis present

## 2016-10-12 DIAGNOSIS — F22 Delusional disorders: Secondary | ICD-10-CM

## 2016-10-12 DIAGNOSIS — J40 Bronchitis, not specified as acute or chronic: Secondary | ICD-10-CM | POA: Diagnosis not present

## 2016-10-12 HISTORY — DX: Chronic obstructive pulmonary disease, unspecified: J44.9

## 2016-10-12 LAB — ACETAMINOPHEN LEVEL: Acetaminophen (Tylenol), Serum: <10 ug/mL — ABNORMAL LOW (ref 10–30)

## 2016-10-12 LAB — CBC WITH DIFFERENTIAL/PLATELET
BASOS ABS: 0.1 10*3/uL (ref 0.0–0.1)
BASOS PCT: 1 %
Eosinophils Absolute: 0.1 10*3/uL (ref 0.0–0.7)
Eosinophils Relative: 1 %
HEMATOCRIT: 43.9 % (ref 39.0–52.0)
HEMOGLOBIN: 15.5 g/dL (ref 13.0–17.0)
LYMPHS PCT: 31 %
Lymphs Abs: 3.3 10*3/uL (ref 0.7–4.0)
MCH: 30.5 pg (ref 26.0–34.0)
MCHC: 35.3 g/dL (ref 30.0–36.0)
MCV: 86.4 fL (ref 78.0–100.0)
MONOS PCT: 6 %
Monocytes Absolute: 0.7 10*3/uL (ref 0.1–1.0)
NEUTROS ABS: 6.5 10*3/uL (ref 1.7–7.7)
NEUTROS PCT: 61 %
Platelets: 250 10*3/uL (ref 150–400)
RBC: 5.08 MIL/uL (ref 4.22–5.81)
RDW: 12.8 % (ref 11.5–15.5)
WBC: 10.5 10*3/uL (ref 4.0–10.5)

## 2016-10-12 LAB — SALICYLATE LEVEL

## 2016-10-12 LAB — COMPREHENSIVE METABOLIC PANEL
ALBUMIN: 4.6 g/dL (ref 3.5–5.0)
ALK PHOS: 59 U/L (ref 38–126)
ALT: 31 U/L (ref 17–63)
AST: 26 U/L (ref 15–41)
Anion gap: 9 (ref 5–15)
BILIRUBIN TOTAL: 0.4 mg/dL (ref 0.3–1.2)
BUN: 12 mg/dL (ref 6–20)
CALCIUM: 9.4 mg/dL (ref 8.9–10.3)
CO2: 26 mmol/L (ref 22–32)
CREATININE: 0.92 mg/dL (ref 0.61–1.24)
Chloride: 100 mmol/L — ABNORMAL LOW (ref 101–111)
GFR calc Af Amer: >60 mL/min (ref >60)
GFR calc non Af Amer: >60 mL/min (ref >60)
GLUCOSE: 93 mg/dL (ref 65–99)
Potassium: 3.3 mmol/L — ABNORMAL LOW (ref 3.5–5.1)
Sodium: 135 mmol/L (ref 135–145)
TOTAL PROTEIN: 7.5 g/dL (ref 6.5–8.1)

## 2016-10-12 LAB — RAPID URINE DRUG SCREEN, HOSP PERFORMED
Amphetamines: NOT DETECTED
BARBITURATES: NOT DETECTED
Benzodiazepines: POSITIVE — AB
COCAINE: NOT DETECTED
Opiates: NOT DETECTED
TETRAHYDROCANNABINOL: POSITIVE — AB

## 2016-10-12 LAB — ETHANOL: Alcohol, Ethyl (B): <5 mg/dL (ref <5)

## 2016-10-12 MED ORDER — METHADONE HCL 10 MG PO TABS
10.0000 mg | ORAL_TABLET | Freq: Two times a day (BID) | ORAL | Status: DC
  Administered 2016-10-12 – 2016-10-13 (×2): 10 mg via ORAL
  Filled 2016-10-12 (×2): qty 1

## 2016-10-12 MED ORDER — PANTOPRAZOLE SODIUM 40 MG PO TBEC
40.0000 mg | DELAYED_RELEASE_TABLET | Freq: Every day | ORAL | Status: DC
Start: 2016-10-12 — End: 2016-10-13
  Administered 2016-10-12 – 2016-10-13 (×2): 40 mg via ORAL
  Filled 2016-10-12 (×2): qty 1

## 2016-10-12 MED ORDER — UMECLIDINIUM-VILANTEROL 62.5-25 MCG/INH IN AEPB: 1.0000 | INHALATION_SPRAY | Freq: Every day | RESPIRATORY_TRACT | Status: DC

## 2016-10-12 MED ORDER — ALBUTEROL SULFATE HFA 108 (90 BASE) MCG/ACT IN AERS
1.0000 | INHALATION_SPRAY | Freq: Every day | RESPIRATORY_TRACT | Status: DC | PRN
  Administered 2016-10-13 (×2): 1 via RESPIRATORY_TRACT
  Filled 2016-10-12: qty 6.7

## 2016-10-12 MED ORDER — UMECLIDINIUM-VILANTEROL 62.5-25 MCG/INH IN AEPB
1.0000 | INHALATION_SPRAY | Freq: Every day | RESPIRATORY_TRACT | Status: DC
  Administered 2016-10-13 (×2): 1 via RESPIRATORY_TRACT

## 2016-10-12 MED ORDER — LORAZEPAM 1 MG PO TABS
1.0000 mg | ORAL_TABLET | Freq: Three times a day (TID) | ORAL | Status: DC | PRN
  Administered 2016-10-13: 1 mg via ORAL
  Filled 2016-10-12: qty 1

## 2016-10-12 MED ORDER — LISINOPRIL 10 MG PO TABS
10.0000 mg | ORAL_TABLET | Freq: Every day | ORAL | Status: DC
  Administered 2016-10-13: 10 mg via ORAL
  Filled 2016-10-12 (×2): qty 1

## 2016-10-12 MED ORDER — SIMVASTATIN 10 MG PO TABS: 40.0000 mg | ORAL_TABLET | Freq: Every day | ORAL | Status: DC

## 2016-10-12 MED ORDER — ONDANSETRON HCL 4 MG PO TABS: 4.0000 mg | ORAL_TABLET | Freq: Three times a day (TID) | ORAL | Status: DC | PRN

## 2016-10-12 MED ORDER — SIMVASTATIN 10 MG PO TABS
40.0000 mg | ORAL_TABLET | Freq: Every day | ORAL | Status: DC
  Administered 2016-10-12: 40 mg via ORAL
  Filled 2016-10-12: qty 4

## 2016-10-12 MED ORDER — CYCLOBENZAPRINE HCL 10 MG PO TABS
10.0000 mg | ORAL_TABLET | Freq: Three times a day (TID) | ORAL | Status: DC | PRN
  Administered 2016-10-13 (×2): 10 mg via ORAL
  Filled 2016-10-12 (×2): qty 1

## 2016-10-12 MED ORDER — HYDROCORTISONE ACETATE 25 MG RE SUPP
25.0000 mg | Freq: Two times a day (BID) | RECTAL | Status: DC | PRN
  Administered 2016-10-12 – 2016-10-13 (×2): 25 mg via RECTAL
  Filled 2016-10-12 (×2): qty 1

## 2016-10-12 MED ORDER — ACETAMINOPHEN 325 MG PO TABS
650.0000 mg | ORAL_TABLET | ORAL | Status: DC | PRN
  Administered 2016-10-12 – 2016-10-13 (×2): 650 mg via ORAL
  Filled 2016-10-12 (×2): qty 2

## 2016-10-12 MED ORDER — IBUPROFEN 400 MG PO TABS: 600.0000 mg | ORAL_TABLET | Freq: Three times a day (TID) | ORAL | Status: DC | PRN

## 2016-10-12 MED ORDER — TRAZODONE HCL 50 MG PO TABS
100.0000 mg | ORAL_TABLET | Freq: Every day | ORAL | Status: DC
  Administered 2016-10-12: 100 mg via ORAL
  Filled 2016-10-12: qty 2
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
## 2016-10-12 NOTE — ED Notes (Signed)
Given supper tray  

## 2016-10-12 NOTE — ED Notes (Signed)
Verified count of methadone with second nurse and paper for pharmacy completed.  1 questionable pill in with methadone.  Also foil in pill bottle with white powder inside .   Also some gauze with yellow film inside pill bottle.

## 2016-10-12 NOTE — Progress Notes (Signed)
Per Patriciaann Clan PA recommend a.m. Psych evaluation Ilene Witcher K. Nash Shearer, LPC-A, [facility]  Counselor 10/12/2016 9:43 PM
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
## 2016-10-12 NOTE — ED Notes (Signed)
Pt belongings locked up

## 2016-10-13 DIAGNOSIS — F1490 Cocaine use, unspecified, uncomplicated: Secondary | ICD-10-CM | POA: Diagnosis not present

## 2016-10-13 DIAGNOSIS — F1994 Other psychoactive substance use, unspecified with psychoactive substance-induced mood disorder: Secondary | ICD-10-CM | POA: Diagnosis not present

## 2016-10-13 DIAGNOSIS — Z87891 Personal history of nicotine dependence: Secondary | ICD-10-CM | POA: Diagnosis not present

## 2016-10-13 DIAGNOSIS — Z79899 Other long term (current) drug therapy: Secondary | ICD-10-CM | POA: Diagnosis not present

## 2016-10-13 DIAGNOSIS — Z811 Family history of alcohol abuse and dependence: Secondary | ICD-10-CM | POA: Diagnosis not present

## 2016-10-13 NOTE — Discharge Instructions (Signed)
Take your usual prescriptions as previously directed.  Call your regular medical doctor and your mental health provider today to schedule a follow up appointment within the week.  Return to the Emergency Department immediately sooner if worsening.
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
## 2016-10-13 NOTE — ED Notes (Addendum)
Pt denies SI/HI at discharge. Pt reports step-father is going to pick up pt.   Pt belongings returned to pt. Pharmacy contacted concerning pt home medication. Primary RN to pharmacy to pick up pt medication.

## 2016-10-13 NOTE — ED Notes (Signed)
Pt signed for methadone.  Given Ventolin HFA Deneise [facility] label) and Anoro [facility]) .  Pt says he is missing Ventilon just filled yesterday, verified with Pharmacy pt just picked up 10-12-16.  No other medication found with pts name on it.  Supervisor informed.

## 2016-10-13 NOTE — ED Notes (Signed)
Pt constantly asking for something to eat, a medicine to help him relax and to go to the restroom. Pt given ativan to help calm him down. Pt also c/o back pain and I gave him flexeril.

## 2016-10-13 NOTE — ED Provider Notes (Signed)
Psych staff has re-evaluated pt today: states pt can be d/c home with outpt f/u at [facility]. Will d/c stable.    Francine Graven, DO 10/13/16 1314

## 2016-10-18 DIAGNOSIS — R52 Pain, unspecified: Secondary | ICD-10-CM | POA: Diagnosis not present

## 2016-10-18 DIAGNOSIS — G039 Meningitis, unspecified: Secondary | ICD-10-CM | POA: Diagnosis not present

## 2016-10-18 DIAGNOSIS — S32009A Unspecified fracture of unspecified lumbar vertebra, initial encounter for closed fracture: Secondary | ICD-10-CM | POA: Diagnosis not present

## 2016-10-22 ENCOUNTER — Encounter (HOSPITAL_COMMUNITY): Payer: Self-pay | Admitting: Emergency Medicine

## 2016-10-22 ENCOUNTER — Emergency Department (HOSPITAL_COMMUNITY)
Admission: EM | Admit: 2016-10-22 | Discharge: 2016-10-22 | Disposition: A | Discharge status: Home / Self Care | Payer: Medicare HMO | Attending: Emergency Medicine | Admitting: Emergency Medicine

## 2016-10-22 DIAGNOSIS — R440 Auditory hallucinations: Secondary | ICD-10-CM | POA: Diagnosis present

## 2016-10-22 DIAGNOSIS — F29 Unspecified psychosis not due to a substance or known physiological condition: Secondary | ICD-10-CM | POA: Diagnosis not present

## 2016-10-22 DIAGNOSIS — Z79899 Other long term (current) drug therapy: Secondary | ICD-10-CM | POA: Insufficient documentation

## 2016-10-22 DIAGNOSIS — Z87891 Personal history of nicotine dependence: Secondary | ICD-10-CM | POA: Diagnosis not present

## 2016-10-22 DIAGNOSIS — J449 Chronic obstructive pulmonary disease, unspecified: Secondary | ICD-10-CM | POA: Insufficient documentation

## 2016-10-22 LAB — CBC WITH DIFFERENTIAL/PLATELET
BASOS PCT: 0 %
Basophils Absolute: 0 10*3/uL (ref 0.0–0.1)
EOS ABS: 0 10*3/uL (ref 0.0–0.7)
Eosinophils Relative: 0 %
HCT: 43.2 % (ref 39.0–52.0)
HEMOGLOBIN: 15.5 g/dL (ref 13.0–17.0)
Lymphocytes Relative: 15 %
Lymphs Abs: 1.7 10*3/uL (ref 0.7–4.0)
MCH: 30.8 pg (ref 26.0–34.0)
MCHC: 35.9 g/dL (ref 30.0–36.0)
MCV: 85.7 fL (ref 78.0–100.0)
MONOS PCT: 4 %
Monocytes Absolute: 0.5 10*3/uL (ref 0.1–1.0)
NEUTROS PCT: 81 %
Neutro Abs: 9 10*3/uL — ABNORMAL HIGH (ref 1.7–7.7)
Platelets: 251 10*3/uL (ref 150–400)
RBC: 5.04 MIL/uL (ref 4.22–5.81)
RDW: 12.7 % (ref 11.5–15.5)
WBC: 11.1 10*3/uL — ABNORMAL HIGH (ref 4.0–10.5)

## 2016-10-22 LAB — BASIC METABOLIC PANEL
Anion gap: 9 (ref 5–15)
BUN: 7 mg/dL (ref 6–20)
CALCIUM: 9.7 mg/dL (ref 8.9–10.3)
CO2: 25 mmol/L (ref 22–32)
CREATININE: 0.93 mg/dL (ref 0.61–1.24)
Chloride: 103 mmol/L (ref 101–111)
GFR calc Af Amer: >60 mL/min (ref >60)
GFR calc non Af Amer: >60 mL/min (ref >60)
Glucose, Bld: 108 mg/dL — ABNORMAL HIGH (ref 65–99)
Potassium: 3.9 mmol/L (ref 3.5–5.1)
SODIUM: 137 mmol/L (ref 135–145)

## 2016-10-22 LAB — RAPID URINE DRUG SCREEN, HOSP PERFORMED
Amphetamines: NOT DETECTED
Barbiturates: NOT DETECTED
Benzodiazepines: POSITIVE — AB
COCAINE: NOT DETECTED
OPIATES: NOT DETECTED
Tetrahydrocannabinol: NOT DETECTED

## 2016-10-22 LAB — URINALYSIS, ROUTINE W REFLEX MICROSCOPIC
BILIRUBIN URINE: NEGATIVE
Glucose, UA: NEGATIVE mg/dL
Hgb urine dipstick: NEGATIVE
KETONES UR: NEGATIVE mg/dL
Leukocytes, UA: NEGATIVE
Nitrite: NEGATIVE
Protein, ur: NEGATIVE mg/dL
Specific Gravity, Urine: 1.006 (ref 1.005–1.030)
pH: 7 (ref 5.0–8.0)

## 2016-10-22 MED ORDER — METHADONE HCL 10 MG PO TABS: 20.0000 mg | ORAL_TABLET | Freq: Two times a day (BID) | ORAL | Status: DC

## 2016-10-22 MED ORDER — ALBUTEROL SULFATE HFA 108 (90 BASE) MCG/ACT IN AERS: 1.0000 | INHALATION_SPRAY | Freq: Every day | RESPIRATORY_TRACT | Status: DC | PRN

## 2016-10-22 MED ORDER — PANTOPRAZOLE SODIUM 40 MG PO TBEC: 40.0000 mg | DELAYED_RELEASE_TABLET | Freq: Every day | ORAL | Status: DC

## 2016-10-22 MED ORDER — BENZTROPINE MESYLATE 1 MG PO TABS: 0.5000 mg | ORAL_TABLET | Freq: Two times a day (BID) | ORAL | Status: DC

## 2016-10-22 MED ORDER — LISINOPRIL 10 MG PO TABS: 10.0000 mg | ORAL_TABLET | Freq: Every day | ORAL | Status: DC

## 2016-10-22 MED ORDER — POLYETHYLENE GLYCOL 3350 17 G PO PACK: 17.0000 g | PACK | Freq: Every day | ORAL | Status: DC

## 2016-10-22 MED ORDER — SIMVASTATIN 10 MG PO TABS: 20.0000 mg | ORAL_TABLET | Freq: Every day | ORAL | Status: DC

## 2016-10-22 MED ORDER — OXYCODONE-ACETAMINOPHEN 5-325 MG PO TABS: 1.0000 | ORAL_TABLET | ORAL | Status: DC | PRN

## 2016-10-22 MED ORDER — RISPERIDONE 0.5 MG PO TABS
0.5000 mg | ORAL_TABLET | Freq: Two times a day (BID) | ORAL | Status: DC
  Filled 2016-10-22 (×6): qty 1

## 2016-10-22 MED ORDER — TRAZODONE HCL 50 MG PO TABS: 100.0000 mg | ORAL_TABLET | Freq: Every day | ORAL | Status: DC

## 2016-10-22 MED ORDER — UMECLIDINIUM-VILANTEROL 62.5-25 MCG/INH IN AEPB
1.0000 | INHALATION_SPRAY | Freq: Every day | RESPIRATORY_TRACT | Status: DC
  Filled 2016-10-22: qty 14

## 2016-10-22 MED ORDER — DOCUSATE SODIUM 100 MG PO CAPS: 100.0000 mg | ORAL_CAPSULE | Freq: Every day | ORAL | Status: DC

## 2016-10-22 MED ORDER — CYCLOBENZAPRINE HCL 10 MG PO TABS: 10.0000 mg | ORAL_TABLET | Freq: Three times a day (TID) | ORAL | Status: DC | PRN

## 2016-10-22 NOTE — Discharge Instructions (Signed)
Follow up with our behavior health counselor or [facility] with [facility]

## 2016-10-22 NOTE — ED Notes (Signed)
TTS in progress 
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
## 2016-10-22 NOTE — ED Triage Notes (Signed)
Pt states has been hearing voices intermittent since "bad divorce" 10-15 yrs ago. Pt has had inpt tx in past, about one yr ago. Pt states has been bad x 2 weeks now. States voices are after him. Denies them telling pt to do bad things. Pt cooperative and pleasant. States takes meds as prescribed. Denies si/hi. denies visual hallucinations.
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
## 2016-10-22 NOTE — ED Notes (Signed)
Unable to get clear EKG reading, pt has spinal stimulator. Dr Dolly Rias aware

## 2016-10-23 ENCOUNTER — Emergency Department (HOSPITAL_COMMUNITY)
Admission: EM | Admit: 2016-10-23 | Discharge: 2016-10-24 | Disposition: A | Discharge status: Home / Self Care | Payer: Medicare HMO | Attending: Emergency Medicine | Admitting: Emergency Medicine

## 2016-10-23 ENCOUNTER — Ambulatory Visit (HOSPITAL_COMMUNITY)
Admission: AD | Admit: 2016-10-23 | Discharge: 2016-10-23 | Disposition: A | Discharge status: Home / Self Care | Payer: Medicare HMO | Source: Home / Self Care | Attending: Psychiatry | Admitting: Psychiatry

## 2016-10-23 ENCOUNTER — Encounter (HOSPITAL_COMMUNITY): Payer: Self-pay | Admitting: Emergency Medicine

## 2016-10-23 DIAGNOSIS — F3162 Bipolar disorder, current episode mixed, moderate: Secondary | ICD-10-CM | POA: Insufficient documentation

## 2016-10-23 DIAGNOSIS — Z79899 Other long term (current) drug therapy: Secondary | ICD-10-CM | POA: Insufficient documentation

## 2016-10-23 DIAGNOSIS — R440 Auditory hallucinations: Secondary | ICD-10-CM

## 2016-10-23 DIAGNOSIS — J449 Chronic obstructive pulmonary disease, unspecified: Secondary | ICD-10-CM | POA: Insufficient documentation

## 2016-10-23 DIAGNOSIS — Z87891 Personal history of nicotine dependence: Secondary | ICD-10-CM | POA: Insufficient documentation

## 2016-10-23 DIAGNOSIS — Z811 Family history of alcohol abuse and dependence: Secondary | ICD-10-CM

## 2016-10-23 DIAGNOSIS — Z9660 Presence of unspecified orthopedic joint implant: Secondary | ICD-10-CM | POA: Diagnosis not present

## 2016-10-23 DIAGNOSIS — F315 Bipolar disorder, current episode depressed, severe, with psychotic features: Secondary | ICD-10-CM

## 2016-10-23 DIAGNOSIS — F319 Bipolar disorder, unspecified: Secondary | ICD-10-CM | POA: Diagnosis not present

## 2016-10-23 DIAGNOSIS — F1994 Other psychoactive substance use, unspecified with psychoactive substance-induced mood disorder: Secondary | ICD-10-CM | POA: Diagnosis not present

## 2016-10-23 LAB — RAPID URINE DRUG SCREEN, HOSP PERFORMED
Amphetamines: NOT DETECTED
Barbiturates: NOT DETECTED
Benzodiazepines: NOT DETECTED
Cocaine: NOT DETECTED
Opiates: NOT DETECTED
Tetrahydrocannabinol: NOT DETECTED

## 2016-10-23 MED ORDER — BENZTROPINE MESYLATE 0.5 MG PO TABS
0.5000 mg | ORAL_TABLET | Freq: Two times a day (BID) | ORAL | Status: DC
  Administered 2016-10-23 – 2016-10-24 (×3): 0.5 mg via ORAL
  Filled 2016-10-23 (×3): qty 1

## 2016-10-23 MED ORDER — TRAZODONE HCL 100 MG PO TABS
100.0000 mg | ORAL_TABLET | Freq: Every day | ORAL | Status: DC
  Administered 2016-10-23: 100 mg via ORAL
  Filled 2016-10-23: qty 1

## 2016-10-23 MED ORDER — POLYETHYLENE GLYCOL 3350 17 G PO PACK
17.0000 g | PACK | Freq: Every day | ORAL | Status: DC
  Administered 2016-10-24: 17 g via ORAL
  Filled 2016-10-23 (×2): qty 1

## 2016-10-23 MED ORDER — PANTOPRAZOLE SODIUM 40 MG PO TBEC
40.0000 mg | DELAYED_RELEASE_TABLET | Freq: Every day | ORAL | Status: DC
  Administered 2016-10-23 – 2016-10-24 (×2): 40 mg via ORAL
  Filled 2016-10-23 (×2): qty 1

## 2016-10-23 MED ORDER — SIMVASTATIN 40 MG PO TABS
40.0000 mg | ORAL_TABLET | Freq: Every day | ORAL | Status: DC
  Administered 2016-10-23: 40 mg via ORAL
  Filled 2016-10-23 (×2): qty 1

## 2016-10-23 MED ORDER — LISINOPRIL 10 MG PO TABS
10.0000 mg | ORAL_TABLET | Freq: Every day | ORAL | Status: DC
  Administered 2016-10-23 – 2016-10-24 (×2): 10 mg via ORAL
  Filled 2016-10-23 (×2): qty 1

## 2016-10-23 MED ORDER — RISPERIDONE 0.5 MG PO TABS
0.5000 mg | ORAL_TABLET | Freq: Two times a day (BID) | ORAL | Status: DC
Start: 2016-10-23 — End: 2016-10-24
  Administered 2016-10-23 – 2016-10-24 (×3): 0.5 mg via ORAL
  Filled 2016-10-23 (×3): qty 1

## 2016-10-23 MED ORDER — OXYCODONE-ACETAMINOPHEN 5-325 MG PO TABS
1.0000 | ORAL_TABLET | ORAL | Status: DC | PRN
  Administered 2016-10-23 – 2016-10-24 (×2): 1 via ORAL
  Filled 2016-10-23 (×2): qty 1

## 2016-10-23 MED ORDER — UMECLIDINIUM-VILANTEROL 62.5-25 MCG/INH IN AEPB
1.0000 | INHALATION_SPRAY | Freq: Every day | RESPIRATORY_TRACT | Status: DC
  Administered 2016-10-24: 1 via RESPIRATORY_TRACT
  Filled 2016-10-23: qty 14

## 2016-10-23 MED ORDER — DOCUSATE SODIUM 100 MG PO CAPS
100.0000 mg | ORAL_CAPSULE | Freq: Every day | ORAL | Status: DC
Start: 2016-10-23 — End: 2016-10-24
  Administered 2016-10-23 – 2016-10-24 (×2): 100 mg via ORAL
  Filled 2016-10-23 (×2): qty 1

## 2016-10-23 MED ORDER — ALBUTEROL SULFATE HFA 108 (90 BASE) MCG/ACT IN AERS
1.0000 | INHALATION_SPRAY | Freq: Every day | RESPIRATORY_TRACT | Status: DC | PRN
  Administered 2016-10-24: 1 via RESPIRATORY_TRACT
  Filled 2016-10-23: qty 6.7

## 2016-10-23 MED ORDER — UMECLIDINIUM-VILANTEROL 62.5-25 MCG/INH IN AEPB: 1.0000 | INHALATION_SPRAY | Freq: Every day | RESPIRATORY_TRACT | Status: DC

## 2016-10-23 MED ORDER — ONDANSETRON HCL 4 MG PO TABS: 4.0000 mg | ORAL_TABLET | Freq: Three times a day (TID) | ORAL | Status: DC | PRN

## 2016-10-23 MED ORDER — IBUPROFEN 200 MG PO TABS: 600.0000 mg | ORAL_TABLET | Freq: Three times a day (TID) | ORAL | Status: DC | PRN

## 2016-10-23 MED ORDER — LORAZEPAM 1 MG PO TABS: 1.0000 mg | ORAL_TABLET | Freq: Three times a day (TID) | ORAL | Status: DC | PRN

## 2016-10-23 NOTE — ED Notes (Signed)
Bed: [facility] Expected date:  Expected time:  Means of arrival:  Comments: TCU 30

## 2016-10-23 NOTE — ED Notes (Signed)
Patient has been pleasant and cooperative since admission to the unit.  He was oriented to the unit and to his surroundings.  States he has been hearing voices recently.  He denies thoughts of harm to self or others.
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
## 2016-10-23 NOTE — ED Triage Notes (Signed)
Pt reports hearing voices that someone is out to get him. Denies SI/HI or substance abuse. Pt calm and cooperative.

## 2016-10-23 NOTE — ED Notes (Signed)
Bed: [facility]PT4 Expected date:  Expected time:  Means of arrival:  Comments: 

## 2016-10-23 NOTE — ED Notes (Signed)
Hourly rounding reveals patient sleeping in room. No complaints, stable, in no acute distress. Q15 minute rounds and monitoring via Security Cameras to continue. 

## 2016-10-23 NOTE — ED Notes (Signed)
Report to include Situation, Background, Assessment, and Recommendations received from [facility]. Patient alert and oriented, warm and dry, in no acute distress. Patient denies SI, HI, VH and pain. Patient states hears voices without command. Patient made aware of Q15 minute rounds and security cameras for their safety. Patient instructed to come to me with needs or concerns.

## 2016-10-23 NOTE — H&P (Signed)
Behavioral Health Medical Screening Exam  [name] [name] is an 51 y.o. male.   Total Time spent with patient: 15 minutes  Psychiatric Specialty Exam: Physical Exam  Constitutional: He is oriented to person, place, and time. He appears well-developed and well-nourished.  HENT:  Head: Normocephalic and atraumatic.  Eyes: Conjunctivae are normal.  Neck: Normal range of motion.  Cardiovascular: Normal rate, regular rhythm and normal heart sounds.   Respiratory: Effort normal and breath sounds normal.  GI: Soft. Bowel sounds are normal.  Musculoskeletal: Normal range of motion.  Neurological: He is alert and oriented to person, place, and time.  Skin: Skin is dry.    Review of Systems  Psychiatric/Behavioral: Positive for depression and hallucinations. The patient is nervous/anxious and has insomnia.   All other systems reviewed and are negative.   There were no vitals taken for this visit.There is no height or weight on file to calculate BMI.  General Appearance: Casual and Fairly Groomed  Eye Contact:  Fair  Speech:  Clear and Coherent and Normal Rate  Volume:  Normal  Mood:  Anxious, Depressed and Irritable  Affect:  Appropriate, Congruent and Depressed  Thought Process:  Coherent, Goal Directed, Linear and Descriptions of Associations: Loose  Orientation:  Full (Time, Place, and Person)  Thought Content:  Focused on hearing voices, very anxious  Suicidal Thoughts:  No  Homicidal Thoughts:  No  Memory:  Immediate;   Fair Recent;   Fair Remote;   Fair  Judgement:  Fair  Insight:  Fair  Psychomotor Activity:  Normal  Concentration: Concentration: Fair and Attention Span: Fair  Recall:  [name]: Fair  Akathisia:  No  Handed:    AIMS (if indicated):     Assets:  Communication Skills Desire for Improvement Resilience Social Support  Sleep:       Musculoskeletal: Strength & Muscle Tone: within normal limits Gait & Station:  normal Patient leans: N/A  HR 108, BP 139/96 T98.8 RR 18  Recommendations:  Based on my evaluation the patient does not appear to have an emergency medical condition.  Benjamine Mola, FNP 10/23/2016, 2:11 PM  Note reviewed
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
## 2016-10-23 NOTE — ED Notes (Signed)
Hourly rounding reveals patient in room. No complaints, stable, in no acute distress. Q15 minute rounds and monitoring via Security Cameras to continue. 

## 2016-10-24 DIAGNOSIS — Z811 Family history of alcohol abuse and dependence: Secondary | ICD-10-CM

## 2016-10-24 DIAGNOSIS — Z87891 Personal history of nicotine dependence: Secondary | ICD-10-CM

## 2016-10-24 DIAGNOSIS — F22 Delusional disorders: Secondary | ICD-10-CM | POA: Diagnosis not present

## 2016-10-24 DIAGNOSIS — F3162 Bipolar disorder, current episode mixed, moderate: Secondary | ICD-10-CM

## 2016-10-24 DIAGNOSIS — I10 Essential (primary) hypertension: Secondary | ICD-10-CM | POA: Diagnosis not present

## 2016-10-24 DIAGNOSIS — F1490 Cocaine use, unspecified, uncomplicated: Secondary | ICD-10-CM

## 2016-10-24 DIAGNOSIS — F329 Major depressive disorder, single episode, unspecified: Secondary | ICD-10-CM | POA: Diagnosis not present

## 2016-10-24 DIAGNOSIS — F1994 Other psychoactive substance use, unspecified with psychoactive substance-induced mood disorder: Secondary | ICD-10-CM

## 2016-10-24 DIAGNOSIS — Z79891 Long term (current) use of opiate analgesic: Secondary | ICD-10-CM | POA: Diagnosis not present

## 2016-10-24 DIAGNOSIS — Z79899 Other long term (current) drug therapy: Secondary | ICD-10-CM

## 2016-10-24 DIAGNOSIS — R440 Auditory hallucinations: Secondary | ICD-10-CM | POA: Diagnosis not present

## 2016-10-24 MED ORDER — GABAPENTIN 100 MG PO CAPS
100.0000 mg | ORAL_CAPSULE | Freq: Three times a day (TID) | ORAL | Status: DC
  Administered 2016-10-24: 100 mg via ORAL
  Filled 2016-10-24: qty 1

## 2016-10-24 MED ORDER — HYDROXYZINE HCL 25 MG PO TABS: 50.0000 mg | ORAL_TABLET | Freq: Every day | ORAL | Status: DC

## 2016-10-24 MED ORDER — RISPERIDONE 1 MG PO TABS: 1.0000 mg | ORAL_TABLET | Freq: Two times a day (BID) | ORAL | 0 refills | Status: DC

## 2016-10-24 MED ORDER — GABAPENTIN 100 MG PO CAPS: 100.0000 mg | ORAL_CAPSULE | Freq: Three times a day (TID) | ORAL | 0 refills | Status: DC

## 2016-10-24 MED ORDER — RISPERIDONE 1 MG PO TABS: 1.0000 mg | ORAL_TABLET | Freq: Two times a day (BID) | ORAL | Status: DC

## 2016-10-24 MED ORDER — HYDROXYZINE HCL 50 MG PO TABS
50.0000 mg | ORAL_TABLET | Freq: Every day | ORAL | 0 refills | Status: DC
Start: 2016-10-24 — End: 2017-01-03
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
## 2016-10-24 NOTE — ED Notes (Signed)
Hourly rounding reveals patient in room. No complaints, stable, in no acute distress. Q15 minute rounds and monitoring via Security Cameras to continue. 

## 2016-10-24 NOTE — ED Notes (Signed)
Hourly rounding reveals patient sleeping in room. No complaints, stable, in no acute distress. Q15 minute rounds and monitoring via Security Cameras to continue. 

## 2016-10-24 NOTE — [facility] Suicide Risk Assessment (Signed)
Suicide Risk Assessment  Discharge Assessment   [facility] Discharge Suicide Risk Assessment   Principal Problem: Bipolar 1 disorder, mixed, moderate ([place]) Discharge Diagnoses:  Patient Active Problem List   Diagnosis Date Noted  . Bipolar 1 disorder, mixed, moderate ([place]) [F31.62] 03/12/2015    Priority: High  . Substance induced mood disorder ([place]) [F19.94] 09/20/2016  . Hyperprolactinemia ([place]) [E22.1] 03/14/2015  . Opiate dependence, continuous ([place]) [F11.20] 03/12/2015  . Cocaine use disorder, moderate, in sustained remission ([place]) [F14.21] 03/12/2015  . Acute renal failure ([place]) [N17.9] 02/17/2012  . Rhabdomyolysis [M62.82] 02/13/2012  . Dehydration [E86.0] 02/13/2012  . Hyponatremia [E87.1] 02/13/2012  . OVERWEIGHT [E66.3] 01/24/2009  . ARACHNOIDITIS [G03.9] 09/27/2008  . MALAISE AND FATIGUE [R53.81, R53.83] 09/27/2008  . HYPERLIPIDEMIA [E78.5] 07/01/2008  . BIPOLAR AFFECTIVE DISORDER [F31.9] 07/01/2008  . MIGRAINE HEADACHE [G43.909] 07/01/2008  . GERD [K21.9] 07/01/2008  . ARTHRITIS [M12.9] 07/01/2008  . DEGENERATIVE DISC DISEASE [IMO0002] 07/01/2008  . LOW BACK PAIN, CHRONIC [M54.5] 07/01/2008    Total Time spent with patient: 45 minutes  Musculoskeletal: Strength & Muscle Tone: within normal limits Gait & Station: normal Patient leans: N/A  Psychiatric Specialty Exam: Physical Exam  Constitutional: He is oriented to person, place, and time. He appears well-developed and well-nourished.  HENT:  Head: Normocephalic.  Neck: Normal range of motion.  Respiratory: Effort normal.  Musculoskeletal: Normal range of motion.  Neurological: He is alert and oriented to person, place, and time.  Psychiatric: He has a normal mood and affect. His speech is normal and behavior is normal. Judgment and thought content normal. Cognition and memory are normal.    Review of Systems  Psychiatric/Behavioral: Positive for substance abuse.  All other systems reviewed and are negative.    Blood pressure 107/75, pulse 98, temperature 98.2 F (36.8 C), temperature source Oral, resp. rate 18, SpO2 97 %.There is no height or weight on file to calculate BMI.  General Appearance: Casual  Eye Contact:  Good  Speech:  Normal Rate  Volume:  Normal  Mood:  Euthymic  Affect:  Congruent  Thought Process:  Coherent and Descriptions of Associations: Intact  Orientation:  Full (Time, Place, and Person)  Thought Content:  WDL and Logical  Suicidal Thoughts:  No  Homicidal Thoughts:  No  Memory:  Immediate;   Good Recent;   Good Remote;   Good  Judgement:  Fair  Insight:  Fair  Psychomotor Activity:  Normal  Concentration:  Concentration: Good and Attention Span: Good  Recall:  Good  Fund of Knowledge:  Good  Language:  Fair  Akathisia:  No  Handed:  Right  AIMS (if indicated):     Assets:  Leisure Time Physical Health Resilience Social Support  ADL's:  Intact  Cognition:  WNL  Sleep:       Mental Status Per Nursing Assessment::   On Admission:   hallucinations  Demographic Factors:  Male and Caucasian  Loss Factors: NA  Historical Factors: NA  Risk Reduction Factors:   Sense of responsibility to family, Living with another person, especially a relative, Positive social support and Positive therapeutic relationship  Continued Clinical Symptoms:  None  Cognitive Features That Contribute To Risk:  None    Suicide Risk:  Minimal: No identifiable suicidal ideation.  Patients presenting with no risk factors but with morbid ruminations; may be classified as minimal risk based on the severity of the depressive symptoms    Plan Of Care/Follow-up recommendations:  Activity:  as tolerated Diet:  heart healhty  diet  Torria Fromer, Theodoro Clock, NP 10/24/2016, 12:08 PM
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
## 2016-10-24 NOTE — ED Notes (Signed)
Hourly rounding reveals patient in room. Stable, in no acute distress. Q15 minute rounds and monitoring via Security Cameras to continue. 

## 2016-10-24 NOTE — ED Notes (Signed)
Pt. Requesting inhaler.

## 2016-10-24 NOTE — ED Notes (Signed)
Pt discharged home. Discharged instructions read to pt who verbalized understanding. All belongings returned to pt who signed for same. Denies SI/HI, is not delusional and not responding to internal stimuli. Escorted pt to the ED exit.    

## 2016-10-24 NOTE — ED Notes (Signed)
Introduced self to patient. Pt oriented to unit expectations.  Assessed pt for:  A) Anxiety &/or agitation: Pt has been calm and cooperative, mostly sleeping this morning, with no complaints voiced. Denies SI/HI and reports very mild vocal hallucinations. He believes that Methadone is causing the hallucinations and wants to stop using Methadone. He reports having chronic pain.   S) Safety: Safety maintained with q-15-minute checks and hourly rounds by staff.   A) ADLs: Pt able to perform ADLs independently.  P) Pick-Up (room cleanliness): Pt's room clean and free of clutter.

## 2016-10-25 DIAGNOSIS — R440 Auditory hallucinations: Secondary | ICD-10-CM | POA: Diagnosis not present

## 2016-10-25 DIAGNOSIS — F209 Schizophrenia, unspecified: Secondary | ICD-10-CM | POA: Diagnosis not present

## 2016-10-26 ENCOUNTER — Other Ambulatory Visit: Payer: Self-pay

## 2016-10-26 DIAGNOSIS — S32059S Unspecified fracture of fifth lumbar vertebra, sequela: Secondary | ICD-10-CM | POA: Diagnosis not present

## 2016-10-26 DIAGNOSIS — G8929 Other chronic pain: Secondary | ICD-10-CM | POA: Diagnosis not present

## 2016-10-26 DIAGNOSIS — Z79899 Other long term (current) drug therapy: Secondary | ICD-10-CM | POA: Diagnosis not present

## 2016-10-26 DIAGNOSIS — J449 Chronic obstructive pulmonary disease, unspecified: Secondary | ICD-10-CM | POA: Diagnosis not present

## 2016-10-26 DIAGNOSIS — I2510 Atherosclerotic heart disease of native coronary artery without angina pectoris: Secondary | ICD-10-CM | POA: Diagnosis not present

## 2016-10-26 DIAGNOSIS — Z79891 Long term (current) use of opiate analgesic: Secondary | ICD-10-CM | POA: Diagnosis not present

## 2016-10-26 DIAGNOSIS — M5489 Other dorsalgia: Secondary | ICD-10-CM | POA: Diagnosis not present

## 2016-10-26 DIAGNOSIS — K219 Gastro-esophageal reflux disease without esophagitis: Secondary | ICD-10-CM | POA: Diagnosis not present

## 2016-10-26 DIAGNOSIS — R45851 Suicidal ideations: Secondary | ICD-10-CM | POA: Diagnosis not present

## 2016-10-26 DIAGNOSIS — F209 Schizophrenia, unspecified: Secondary | ICD-10-CM | POA: Diagnosis not present

## 2016-10-26 DIAGNOSIS — M5136 Other intervertebral disc degeneration, lumbar region: Secondary | ICD-10-CM | POA: Diagnosis not present

## 2016-10-26 DIAGNOSIS — F1410 Cocaine abuse, uncomplicated: Secondary | ICD-10-CM | POA: Diagnosis not present

## 2016-10-26 DIAGNOSIS — F329 Major depressive disorder, single episode, unspecified: Secondary | ICD-10-CM | POA: Diagnosis not present

## 2016-10-26 DIAGNOSIS — S32049S Unspecified fracture of fourth lumbar vertebra, sequela: Secondary | ICD-10-CM | POA: Diagnosis not present

## 2016-10-26 DIAGNOSIS — I10 Essential (primary) hypertension: Secondary | ICD-10-CM | POA: Diagnosis not present

## 2016-10-26 DIAGNOSIS — F22 Delusional disorders: Secondary | ICD-10-CM | POA: Diagnosis not present

## 2016-10-27 ENCOUNTER — Telehealth (HOSPITAL_COMMUNITY): Payer: Self-pay | Admitting: *Deleted

## 2016-10-27 NOTE — Patient Outreach (Signed)
[facility]) Care Management  10/26/2016  [place] [birth year] [id number]   Telephone call to patient for ED utilization screen.  States caller not available.    Plan: RN Health Coach will attempt patient again within 10 business days.    [name], RN, MSN [place] [phone]

## 2016-10-27 NOTE — Telephone Encounter (Signed)
Office received ref from Dr. Josephine Cables off for staff to sch new pt appt. Called [phone] which was on ref paper and number was d/c. Called [phone] and call was forwarded to voicemail. lmtcb and office number provided.

## 2016-10-29 ENCOUNTER — Other Ambulatory Visit: Payer: Self-pay

## 2016-10-29 NOTE — Patient Outreach (Signed)
[facility]) Care Management  10/29/2016  [place] 1965/11/01 [id number]   Telephone call to patient for ED utilization screening. Phone states caller not available.    Plan: RN Health Coach will attempt patient again within 10 business days.  [name], RN, MSN [place] [phone]

## 2016-11-03 ENCOUNTER — Telehealth (HOSPITAL_COMMUNITY): Payer: Self-pay | Admitting: *Deleted

## 2016-11-03 NOTE — Telephone Encounter (Signed)
phone call from patient, said he received a phone call from this office.   Said he don't know what this was about.  Michela Pitcher he was seeing someone else.

## 2016-11-04 ENCOUNTER — Other Ambulatory Visit: Payer: Self-pay

## 2016-11-04 NOTE — Patient Outreach (Signed)
[facility]) Care Management  11/04/2016  [place] Dec 29, 1965 [id number]   3rd telephone call to patient for ED utilization.  States caller not available.  Plan: RN Health Coach will send letter to attempt outreach. If no response after ten business days will proceed with case closure.    [name], RN, MSN [place] [phone]

## 2016-11-11 DIAGNOSIS — F319 Bipolar disorder, unspecified: Secondary | ICD-10-CM | POA: Diagnosis not present

## 2016-11-16 DIAGNOSIS — F319 Bipolar disorder, unspecified: Secondary | ICD-10-CM | POA: Diagnosis not present

## 2016-11-19 ENCOUNTER — Other Ambulatory Visit: Payer: Self-pay

## 2016-11-19 NOTE — Patient Outreach (Signed)
[facility]) Care Management  11/19/2016  [name] [name] 05-15-1966 [id number]  Patient has not responded to calls or letter.  Will proceed with case closure.  Will notify care management assistant of case closure.    [name], RN, MSN [place] [phone]

## 2016-11-26 ENCOUNTER — Telehealth (HOSPITAL_COMMUNITY): Payer: Self-pay | Admitting: *Deleted

## 2016-11-26 DIAGNOSIS — C61 Malignant neoplasm of prostate: Secondary | ICD-10-CM | POA: Diagnosis not present

## 2016-11-26 DIAGNOSIS — E669 Obesity, unspecified: Secondary | ICD-10-CM | POA: Diagnosis not present

## 2016-11-26 DIAGNOSIS — I10 Essential (primary) hypertension: Secondary | ICD-10-CM | POA: Diagnosis not present

## 2016-11-26 DIAGNOSIS — F331 Major depressive disorder, recurrent, moderate: Secondary | ICD-10-CM | POA: Diagnosis not present

## 2016-11-26 DIAGNOSIS — J42 Unspecified chronic bronchitis: Secondary | ICD-10-CM | POA: Diagnosis not present

## 2016-11-26 DIAGNOSIS — R739 Hyperglycemia, unspecified: Secondary | ICD-10-CM | POA: Diagnosis not present

## 2016-11-26 DIAGNOSIS — R51 Headache: Secondary | ICD-10-CM | POA: Diagnosis not present

## 2016-11-26 DIAGNOSIS — Z6830 Body mass index (BMI) 30.0-30.9, adult: Secondary | ICD-10-CM | POA: Diagnosis not present

## 2016-11-26 DIAGNOSIS — J410 Simple chronic bronchitis: Secondary | ICD-10-CM | POA: Diagnosis not present

## 2016-11-26 DIAGNOSIS — M549 Dorsalgia, unspecified: Secondary | ICD-10-CM | POA: Diagnosis not present

## 2016-11-26 DIAGNOSIS — Z1389 Encounter for screening for other disorder: Secondary | ICD-10-CM | POA: Diagnosis not present

## 2016-11-26 DIAGNOSIS — E785 Hyperlipidemia, unspecified: Secondary | ICD-10-CM | POA: Diagnosis not present

## 2016-11-26 NOTE — Telephone Encounter (Signed)
LEFT VOICE MESSAGE REGARDING AN APPOINTMENT. 

## 2016-11-29 ENCOUNTER — Encounter: Payer: Self-pay | Admitting: Orthopedic Surgery

## 2016-11-29 DIAGNOSIS — J449 Chronic obstructive pulmonary disease, unspecified: Secondary | ICD-10-CM | POA: Diagnosis not present

## 2016-11-29 DIAGNOSIS — G4733 Obstructive sleep apnea (adult) (pediatric): Secondary | ICD-10-CM | POA: Diagnosis not present

## 2016-11-29 DIAGNOSIS — I10 Essential (primary) hypertension: Secondary | ICD-10-CM | POA: Diagnosis not present

## 2016-11-29 DIAGNOSIS — K210 Gastro-esophageal reflux disease with esophagitis: Secondary | ICD-10-CM | POA: Diagnosis not present

## 2016-12-07 ENCOUNTER — Telehealth: Payer: Self-pay

## 2016-12-07 NOTE — Telephone Encounter (Signed)
Pt received triage letter from DS. He isn't having any GI issues, isn't taking blood thinners and no history of heart attacks. Please call him at [phone]

## 2016-12-08 ENCOUNTER — Telehealth: Payer: Self-pay

## 2016-12-08 NOTE — Telephone Encounter (Signed)
Opened in error

## 2016-12-08 NOTE — Telephone Encounter (Signed)
Pt is scheduled for an Ov with Neil Crouch, PA on 01/03/2017 at 9:30 am due to med list.

## 2017-01-03 ENCOUNTER — Encounter: Payer: Self-pay | Admitting: Gastroenterology

## 2017-01-03 ENCOUNTER — Ambulatory Visit (INDEPENDENT_AMBULATORY_CARE_PROVIDER_SITE_OTHER): Payer: Medicare HMO | Admitting: Gastroenterology

## 2017-01-03 ENCOUNTER — Other Ambulatory Visit: Payer: Self-pay

## 2017-01-03 VITALS — BP 112/78 | HR 89 | Temp 96.8°F | Ht 67.0 in | Wt 218.4 lb

## 2017-01-03 DIAGNOSIS — Z1211 Encounter for screening for malignant neoplasm of colon: Secondary | ICD-10-CM

## 2017-01-03 DIAGNOSIS — K219 Gastro-esophageal reflux disease without esophagitis: Secondary | ICD-10-CM | POA: Diagnosis not present

## 2017-01-03 DIAGNOSIS — R1310 Dysphagia, unspecified: Secondary | ICD-10-CM

## 2017-01-03 DIAGNOSIS — R1319 Other dysphagia: Secondary | ICD-10-CM | POA: Insufficient documentation

## 2017-01-03 MED ORDER — PEG 3350-KCL-NA BICARB-NACL 420 G PO SOLR: 4000.0000 mL | ORAL | 0 refills | Status: DC

## 2017-01-03 NOTE — Assessment & Plan Note (Signed)
51 year old gentleman due for her first ever screening colonoscopy. Family history of colon cancer in a paternal uncle but no first-degree relatives. Denies lower GI symptoms. Manages mild constipation with MiraLAX very well per patient. Colonoscopy in the near future with deep sedation in the OR given polypharmacy and history of polysubstance abuse.  I have discussed the risks, alternatives, benefits with regards to but not limited to the risk of reaction to medication, bleeding, infection, perforation and the patient is agreeable to proceed. Written consent to be obtained.
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
## 2017-01-03 NOTE — Progress Notes (Signed)
CC'ED TO PCP 

## 2017-01-03 NOTE — Patient Instructions (Signed)
NO PA is needed for TCS/EGD ref:# [id number]

## 2017-01-03 NOTE — Assessment & Plan Note (Signed)
Chronic GERD, progressive esophageal dysphagia, epigastric pain. Denies recent illicit drug use. Plan for EGD with dilation in the near future at time of colonoscopy. Deep sedation in the OR.  I have discussed the risks, alternatives, benefits with regards to but not limited to the risk of reaction to medication, bleeding, infection, perforation and the patient is agreeable to proceed. Written consent to be obtained.  Continue current PPI for now.

## 2017-01-03 NOTE — Patient Instructions (Addendum)
1. Colonoscopy and upper endoscopy as scheduled. Please see separate instructions. 2. Make sure you take miralax one to two times daily to keep your bowel movements regular and soft. If you don't, it will be hard to get you cleaned out for the colonoscopy.

## 2017-01-19 DIAGNOSIS — G039 Meningitis, unspecified: Secondary | ICD-10-CM | POA: Diagnosis not present

## 2017-01-19 DIAGNOSIS — R52 Pain, unspecified: Secondary | ICD-10-CM | POA: Diagnosis not present

## 2017-01-19 DIAGNOSIS — S32009A Unspecified fracture of unspecified lumbar vertebra, initial encounter for closed fracture: Secondary | ICD-10-CM | POA: Diagnosis not present

## 2017-01-28 NOTE — Patient Instructions (Signed)
[name] [name]  01/28/2017     @PREFPERIOPPHARMACY @   Your procedure is scheduled on  02/14/2017  Report to [facility] at  615  A.M.  Call this number if you have problems the morning of surgery:  [phone]   Remember:  Do not eat food or drink liquids after midnight.  Take these medicines the morning of surgery with A SIP OF WATER  Flexaril,lisinopril, methadone or oxycodone, prilosec. Use your inhalers before you come.   Do not wear jewelry, make-up or nail polish.  Do not wear lotions, powders, or perfumes, or deoderant.  Do not shave 48 hours prior to surgery.  Men may shave face and neck.  Do not bring valuables to the hospital.  [facility] is not responsible for any belongings or valuables.  Contacts, dentures or bridgework may not be worn into surgery.  Leave your suitcase in the car.  After surgery it may be brought to your room.  For patients admitted to the hospital, discharge time will be determined by your treatment team.  Patients discharged the day of surgery will not be allowed to drive home.   Name and phone number of your driver:   family Special instructions:  Follow the diet and prep instructions given to you by Dr Roseanne Kaufman office.  Please read over the following fact sheets that you were given. Anesthesia Post-op Instructions and Care and Recovery After Surgery       Esophagogastroduodenoscopy Esophagogastroduodenoscopy (EGD) is a procedure to examine the lining of the esophagus, stomach, and first part of the small intestine (duodenum). This procedure is done to check for problems such as inflammation, bleeding, ulcers, or growths. During this procedure, a long, flexible, lighted tube with a camera attached (endoscope) is inserted down the throat. Tell a health care provider about:  Any allergies you have.  All medicines you are taking, including vitamins, herbs, eye drops, creams, and over-the-counter  medicines.  Any problems you or family members have had with anesthetic medicines.  Any blood disorders you have.  Any surgeries you have had.  Any medical conditions you have.  Whether you are pregnant or may be pregnant. What are the risks? Generally, this is a safe procedure. However, problems may occur, including:  Infection.  Bleeding.  A tear (perforation) in the esophagus, stomach, or duodenum.  Trouble breathing.  Excessive sweating.  Spasms of the larynx.  A slowed heartbeat.  Low blood pressure.  What happens before the procedure?  Follow instructions from your health care provider about eating or drinking restrictions.  Ask your health care provider about: ? Changing or stopping your regular medicines. This is especially important if you are taking diabetes medicines or blood thinners. ? Taking medicines such as aspirin and ibuprofen. These medicines can thin your blood. Do not take these medicines before your procedure if your health care provider instructs you not to.  Plan to have someone take you home after the procedure.  If you wear dentures, be ready to remove them before the procedure. What happens during the procedure?  To reduce your risk of infection, your health care team will wash or sanitize their hands.  An IV tube will be put in a vein in your hand or arm. You will get medicines and fluids through this tube.  You will be given one or more of the following: ? A medicine to help you relax (sedative). ?  A medicine to numb the area (local anesthetic). This medicine may be sprayed into your throat. It will make you feel more comfortable and keep you from gagging or coughing during the procedure. ? A medicine for pain.  A mouth guard may be placed in your mouth to protect your teeth and to keep you from biting on the endoscope.  You will be asked to lie on your left side.  The endoscope will be lowered down your throat into your esophagus,  stomach, and duodenum.  Air will be put into the endoscope. This will help your health care provider see better.  The lining of your esophagus, stomach, and duodenum will be examined.  Your health care provider may: ? Take a tissue sample so it can be looked at in a lab (biopsy). ? Remove growths. ? Remove objects (foreign bodies) that are stuck. ? Treat any bleeding with medicines or other devices that stop tissue from bleeding. ? Widen (dilate) or stretch narrowed areas of your esophagus and stomach.  The endoscope will be taken out. The procedure may vary among health care providers and hospitals. What happens after the procedure?  Your blood pressure, heart rate, breathing rate, and blood oxygen level will be monitored often until the medicines you were given have worn off.  Do not eat or drink anything until the numbing medicine has worn off and your gag reflex has returned. This information is not intended to replace advice given to you by your health care provider. Make sure you discuss any questions you have with your health care provider. Document Released: 11/19/2004 Document Revised: 12/25/2015 Document Reviewed: 06/12/2015 Elsevier Interactive Patient Education  2018 [employer]. Esophagogastroduodenoscopy, Care After Refer to this sheet in the next few weeks. These instructions provide you with information about caring for yourself after your procedure. Your health care provider may also give you more specific instructions. Your treatment has been planned according to current medical practices, but problems sometimes occur. Call your health care provider if you have any problems or questions after your procedure. What can I expect after the procedure? After the procedure, it is common to have:  A sore throat.  Nausea.  Bloating.  Dizziness.  Fatigue.  Follow these instructions at home:  Do not eat or drink anything until the numbing medicine (local anesthetic)  has worn off and your gag reflex has returned. You will know that the local anesthetic has worn off when you can swallow comfortably.  Do not drive for 24 hours if you received a medicine to help you relax (sedative).  If your health care provider took a tissue sample for testing during the procedure, make sure to get your test results. This is your responsibility. Ask your health care provider or the department performing the test when your results will be ready.  Keep all follow-up visits as told by your health care provider. This is important. Contact a health care provider if:  You cannot stop coughing.  You are not urinating.  You are urinating less than usual. Get help right away if:  You have trouble swallowing.  You cannot eat or drink.  You have throat or chest pain that gets worse.  You are dizzy or light-headed.  You faint.  You have nausea or vomiting.  You have chills.  You have a fever.  You have severe abdominal pain.  You have black, tarry, or bloody stools. This information is not intended to replace advice given to you by your health  care provider. Make sure you discuss any questions you have with your health care provider. Document Released: 07/05/2012 Document Revised: 12/25/2015 Document Reviewed: 06/12/2015 Elsevier Interactive Patient Education  2018 [employer].  Esophageal Dilatation Esophageal dilatation is a procedure to open a blocked or narrowed part of the esophagus. The esophagus is the long tube in your throat that carries food and liquid from your mouth to your stomach. The procedure is also called esophageal dilation. You may need this procedure if you have a buildup of scar tissue in your esophagus that makes it difficult, painful, or even impossible to swallow. This can be caused by gastroesophageal reflux disease (GERD). In rare cases, people need this procedure because they have cancer of the esophagus or a problem with the way food  moves through the esophagus. Sometimes you may need to have another dilatation to enlarge the opening of the esophagus gradually. Tell a health care provider about:  Any allergies you have.  All medicines you are taking, including vitamins, herbs, eye drops, creams, and over-the-counter medicines.  Any problems you or family members have had with anesthetic medicines.  Any blood disorders you have.  Any surgeries you have had.  Any medical conditions you have.  Any antibiotic medicines you are required to take before dental procedures. What are the risks? Generally, this is a safe procedure. However, problems can occur and include:  Bleeding from a tear in the lining of the esophagus.  A hole (perforation) in the esophagus.  What happens before the procedure?  Do not eat or drink anything after midnight on the night before the procedure or as directed by your health care provider.  Ask your health care provider about changing or stopping your regular medicines. This is especially important if you are taking diabetes medicines or blood thinners.  Plan to have someone take you home after the procedure. What happens during the procedure?  You will be given a medicine that makes you relaxed and sleepy (sedative).  A medicine may be sprayed or gargled to numb the back of the throat.  Your health care provider can use various instruments to do an esophageal dilatation. During the procedure, the instrument used will be placed in your mouth and passed down into your esophagus. Options include: ? Simple dilators. This instrument is carefully placed in the esophagus to stretch it. ? Guided wire bougies. In this method, a flexible tube (endoscope) is used to insert a wire into the esophagus. The dilator is passed over this wire to enlarge the esophagus. Then the wire is removed. ? Balloon dilators. An endoscope with a small balloon at the end is passed down into the esophagus. Inflating  the balloon gently stretches the esophagus and opens it up. What happens after the procedure?  Your blood pressure, heart rate, breathing rate, and blood oxygen level will be monitored often until the medicines you were given have worn off.  Your throat may feel slightly sore and will probably still feel numb. This will improve slowly over time.  You will not be allowed to eat or drink until the throat numbness has resolved.  If this is a same-day procedure, you may be allowed to go home once you have been able to drink, urinate, and sit on the edge of the bed without nausea or dizziness.  If this is a same-day procedure, you should have a friend or family member with you for the next 24 hours after the procedure. This information is not intended to  replace advice given to you by your health care provider. Make sure you discuss any questions you have with your health care provider. Document Released: 09/09/2005 Document Revised: 12/25/2015 Document Reviewed: 11/28/2013 Elsevier Interactive Patient Education  2017 [employer].  Colonoscopy, Adult A colonoscopy is an exam to look at the entire large intestine. During the exam, a lubricated, bendable tube is inserted into the anus and then passed into the rectum, colon, and other parts of the large intestine. A colonoscopy is often done as a part of normal colorectal screening or in response to certain symptoms, such as anemia, persistent diarrhea, abdominal pain, and blood in the stool. The exam can help screen for and diagnose medical problems, including:  Tumors.  Polyps.  Inflammation.  Areas of bleeding.  Tell a health care provider about:  Any allergies you have.  All medicines you are taking, including vitamins, herbs, eye drops, creams, and over-the-counter medicines.  Any problems you or family members have had with anesthetic medicines.  Any blood disorders you have.  Any surgeries you have had.  Any medical  conditions you have.  Any problems you have had passing stool. What are the risks? Generally, this is a safe procedure. However, problems may occur, including:  Bleeding.  A tear in the intestine.  A reaction to medicines given during the exam.  Infection (rare).  What happens before the procedure? Eating and drinking restrictions Follow instructions from your health care provider about eating and drinking, which may include:  A few days before the procedure - follow a low-fiber diet. Avoid nuts, seeds, dried fruit, raw fruits, and vegetables.  1-3 days before the procedure - follow a clear liquid diet. Drink only clear liquids, such as clear broth or bouillon, black coffee or tea, clear juice, clear soft drinks or sports drinks, gelatin dessert, and popsicles. Avoid any liquids that contain red or purple dye.  On the day of the procedure - do not eat or drink anything during the 2 hours before the procedure, or within the time period that your health care provider recommends.  Bowel prep If you were prescribed an oral bowel prep to clean out your colon:  Take it as told by your health care provider. Starting the day before your procedure, you will need to drink a large amount of medicated liquid. The liquid will cause you to have multiple loose stools until your stool is almost clear or light green.  If your skin or anus gets irritated from diarrhea, you may use these to relieve the irritation: ? Medicated wipes, such as adult wet wipes with aloe and vitamin E. ? A skin soothing-product like petroleum jelly.  If you vomit while drinking the bowel prep, take a break for up to 60 minutes and then begin the bowel prep again. If vomiting continues and you cannot take the bowel prep without vomiting, call your health care provider.  General instructions  Ask your health care provider about changing or stopping your regular medicines. This is especially important if you are taking  diabetes medicines or blood thinners.  Plan to have someone take you home from the hospital or clinic. What happens during the procedure?  An IV tube may be inserted into one of your veins.  You will be given medicine to help you relax (sedative).  To reduce your risk of infection: ? Your health care team will wash or sanitize their hands. ? Your anal area will be washed with soap.  You will be  asked to lie on your side with your knees bent.  Your health care provider will lubricate a long, thin, flexible tube. The tube will have a camera and a light on the end.  The tube will be inserted into your anus.  The tube will be gently eased through your rectum and colon.  Air will be delivered into your colon to keep it open. You may feel some pressure or cramping.  The camera will be used to take images during the procedure.  A small tissue sample may be removed from your body to be examined under a microscope (biopsy). If any potential problems are found, the tissue will be sent to a lab for testing.  If small polyps are found, your health care provider may remove them and have them checked for cancer cells.  The tube that was inserted into your anus will be slowly removed. The procedure may vary among health care providers and hospitals. What happens after the procedure?  Your blood pressure, heart rate, breathing rate, and blood oxygen level will be monitored until the medicines you were given have worn off.  Do not drive for 24 hours after the exam.  You may have a small amount of blood in your stool.  You may pass gas and have mild abdominal cramping or bloating due to the air that was used to inflate your colon during the exam.  It is up to you to get the results of your procedure. Ask your health care provider, or the department performing the procedure, when your results will be ready. This information is not intended to replace advice given to you by your health care  provider. Make sure you discuss any questions you have with your health care provider. Document Released: 07/16/2000 Document Revised: 05/19/2016 Document Reviewed: 09/30/2015 Elsevier Interactive Patient Education  2018 [employer].  Colonoscopy, Adult, Care After This sheet gives you information about how to care for yourself after your procedure. Your health care provider may also give you more specific instructions. If you have problems or questions, contact your health care provider. What can I expect after the procedure? After the procedure, it is common to have:  A small amount of blood in your stool for 24 hours after the procedure.  Some gas.  Mild abdominal cramping or bloating.  Follow these instructions at home: General instructions   For the first 24 hours after the procedure: ? Do not drive or use machinery. ? Do not sign important documents. ? Do not drink alcohol. ? Do your regular daily activities at a slower pace than normal. ? Eat soft, easy-to-digest foods. ? Rest often.  Take over-the-counter or prescription medicines only as told by your health care provider.  It is up to you to get the results of your procedure. Ask your health care provider, or the department performing the procedure, when your results will be ready. Relieving cramping and bloating  Try walking around when you have cramps or feel bloated.  Apply heat to your abdomen as told by your health care provider. Use a heat source that your health care provider recommends, such as a moist heat pack or a heating pad. ? Place a towel between your skin and the heat source. ? Leave the heat on for 20-30 minutes. ? Remove the heat if your skin turns bright red. This is especially important if you are unable to feel pain, heat, or cold. You may have a greater risk of getting burned. Eating and drinking  Drink enough fluid to keep your urine clear or pale yellow.  Resume your normal diet as  instructed by your health care provider. Avoid heavy or fried foods that are hard to digest.  Avoid drinking alcohol for as long as instructed by your health care provider. Contact a health care provider if:  You have blood in your stool 2-3 days after the procedure. Get help right away if:  You have more than a small spotting of blood in your stool.  You pass large blood clots in your stool.  Your abdomen is swollen.  You have nausea or vomiting.  You have a fever.  You have increasing abdominal pain that is not relieved with medicine. This information is not intended to replace advice given to you by your health care provider. Make sure you discuss any questions you have with your health care provider. Document Released: 03/02/2004 Document Revised: 04/12/2016 Document Reviewed: 09/30/2015 Elsevier Interactive Patient Education  2018 [place] Anesthesia is a term that refers to techniques, procedures, and medicines that help a person stay safe and comfortable during a medical procedure. Monitored anesthesia care, or sedation, is one type of anesthesia. Your anesthesia specialist may recommend sedation if you will be having a procedure that does not require you to be unconscious, such as:  Cataract surgery.  A dental procedure.  A biopsy.  A colonoscopy.  During the procedure, you may receive a medicine to help you relax (sedative). There are three levels of sedation:  Mild sedation. At this level, you may feel awake and relaxed. You will be able to follow directions.  Moderate sedation. At this level, you will be sleepy. You may not remember the procedure.  Deep sedation. At this level, you will be asleep. You will not remember the procedure.  The more medicine you are given, the deeper your level of sedation will be. Depending on how you respond to the procedure, the anesthesia specialist may change your level of sedation or the type of  anesthesia to fit your needs. An anesthesia specialist will monitor you closely during the procedure. Let your health care provider know about:  Any allergies you have.  All medicines you are taking, including vitamins, herbs, eye drops, creams, and over-the-counter medicines.  Any use of steroids (by mouth or as a cream).  Any problems you or family members have had with sedatives and anesthetic medicines.  Any blood disorders you have.  Any surgeries you have had.  Any medical conditions you have, such as sleep apnea.  Whether you are pregnant or may be pregnant.  Any use of cigarettes, alcohol, or street drugs. What are the risks? Generally, this is a safe procedure. However, problems may occur, including:  Getting too much medicine (oversedation).  Nausea.  Allergic reaction to medicines.  Trouble breathing. If this happens, a breathing tube may be used to help with breathing. It will be removed when you are awake and breathing on your own.  Heart trouble.  Lung trouble.  Before the procedure Staying hydrated Follow instructions from your health care provider about hydration, which may include:  Up to 2 hours before the procedure - you may continue to drink clear liquids, such as water, clear fruit juice, black coffee, and plain tea.  Eating and drinking restrictions Follow instructions from your health care provider about eating and drinking, which may include:  8 hours before the procedure - stop eating heavy meals or foods such as meat, fried foods,  or fatty foods.  6 hours before the procedure - stop eating light meals or foods, such as toast or cereal.  6 hours before the procedure - stop drinking milk or drinks that contain milk.  2 hours before the procedure - stop drinking clear liquids.  Medicines Ask your health care provider about:  Changing or stopping your regular medicines. This is especially important if you are taking diabetes medicines or  blood thinners.  Taking medicines such as aspirin and ibuprofen. These medicines can thin your blood. Do not take these medicines before your procedure if your health care provider instructs you not to.  Tests and exams  You will have a physical exam.  You may have blood tests done to show: ? How well your kidneys and liver are working. ? How well your blood can clot.  General instructions  Plan to have someone take you home from the hospital or clinic.  If you will be going home right after the procedure, plan to have someone with you for 24 hours.  What happens during the procedure?  Your blood pressure, heart rate, breathing, level of pain and overall condition will be monitored.  An IV tube will be inserted into one of your veins.  Your anesthesia specialist will give you medicines as needed to keep you comfortable during the procedure. This may mean changing the level of sedation.  The procedure will be performed. After the procedure  Your blood pressure, heart rate, breathing rate, and blood oxygen level will be monitored until the medicines you were given have worn off.  Do not drive for 24 hours if you received a sedative.  You may: ? Feel sleepy, clumsy, or nauseous. ? Feel forgetful about what happened after the procedure. ? Have a sore throat if you had a breathing tube during the procedure. ? Vomit. This information is not intended to replace advice given to you by your health care provider. Make sure you discuss any questions you have with your health care provider. Document Released: 04/14/2005 Document Revised: 12/26/2015 Document Reviewed: 11/09/2015 Elsevier Interactive Patient Education  2018 [place], Care After These instructions provide you with information about caring for yourself after your procedure. Your health care provider may also give you more specific instructions. Your treatment has been planned according to  current medical practices, but problems sometimes occur. Call your health care provider if you have any problems or questions after your procedure. What can I expect after the procedure? After your procedure, it is common to:  Feel sleepy for several hours.  Feel clumsy and have poor balance for several hours.  Feel forgetful about what happened after the procedure.  Have poor judgment for several hours.  Feel nauseous or vomit.  Have a sore throat if you had a breathing tube during the procedure.  Follow these instructions at home: For at least 24 hours after the procedure:   Do not: ? Participate in activities in which you could fall or become injured. ? Drive. ? Use heavy machinery. ? Drink alcohol. ? Take sleeping pills or medicines that cause drowsiness. ? Make important decisions or sign legal documents. ? Take care of children on your own.  Rest. Eating and drinking  Follow the diet that is recommended by your health care provider.  If you vomit, drink water, juice, or soup when you can drink without vomiting.  Make sure you have little or no nausea before eating solid foods. General instructions  Have  a responsible adult stay with you until you are awake and alert.  Take over-the-counter and prescription medicines only as told by your health care provider.  If you smoke, do not smoke without supervision.  Keep all follow-up visits as told by your health care provider. This is important. Contact a health care provider if:  You keep feeling nauseous or you keep vomiting.  You feel light-headed.  You develop a rash.  You have a fever. Get help right away if:  You have trouble breathing. This information is not intended to replace advice given to you by your health care provider. Make sure you discuss any questions you have with your health care provider. Document Released: 11/09/2015 Document Revised: 03/10/2016 Document Reviewed: 11/09/2015 Elsevier  Interactive Patient Education  [employer].

## 2017-02-08 ENCOUNTER — Encounter (HOSPITAL_COMMUNITY): Payer: Self-pay

## 2017-02-08 ENCOUNTER — Other Ambulatory Visit: Payer: Self-pay

## 2017-02-08 ENCOUNTER — Encounter (HOSPITAL_COMMUNITY)
Admission: RE | Admit: 2017-02-08 | Discharge: 2017-02-08 | Disposition: A | Discharge status: Home / Self Care | Payer: Medicare HMO | Source: Ambulatory Visit | Attending: Internal Medicine | Admitting: Internal Medicine

## 2017-02-08 DIAGNOSIS — Z01818 Encounter for other preprocedural examination: Secondary | ICD-10-CM | POA: Diagnosis not present

## 2017-02-08 DIAGNOSIS — Z01812 Encounter for preprocedural laboratory examination: Secondary | ICD-10-CM | POA: Diagnosis not present

## 2017-02-08 DIAGNOSIS — R1310 Dysphagia, unspecified: Secondary | ICD-10-CM | POA: Diagnosis not present

## 2017-02-08 DIAGNOSIS — K219 Gastro-esophageal reflux disease without esophagitis: Secondary | ICD-10-CM | POA: Insufficient documentation

## 2017-02-08 HISTORY — DX: Sleep apnea, unspecified: G47.30

## 2017-02-08 HISTORY — DX: Dyspnea, unspecified: R06.00

## 2017-02-08 LAB — BASIC METABOLIC PANEL
Anion gap: 7 (ref 5–15)
BUN: 11 mg/dL (ref 6–20)
CALCIUM: 8.9 mg/dL (ref 8.9–10.3)
CO2: 27 mmol/L (ref 22–32)
CREATININE: 0.93 mg/dL (ref 0.61–1.24)
Chloride: 105 mmol/L (ref 101–111)
GFR calc Af Amer: >60 mL/min (ref >60)
GFR calc non Af Amer: >60 mL/min (ref >60)
GLUCOSE: 90 mg/dL (ref 65–99)
Potassium: 3.7 mmol/L (ref 3.5–5.1)
Sodium: 139 mmol/L (ref 135–145)

## 2017-02-08 LAB — CBC
HEMATOCRIT: 38 % — AB (ref 39.0–52.0)
Hemoglobin: 13.6 g/dL (ref 13.0–17.0)
MCH: 29.8 pg (ref 26.0–34.0)
MCHC: 35.8 g/dL (ref 30.0–36.0)
MCV: 83.3 fL (ref 78.0–100.0)
Platelets: 230 10*3/uL (ref 150–400)
RBC: 4.56 MIL/uL (ref 4.22–5.81)
RDW: 12.1 % (ref 11.5–15.5)
WBC: 8.1 10*3/uL (ref 4.0–10.5)

## 2017-02-14 ENCOUNTER — Ambulatory Visit (HOSPITAL_COMMUNITY): Payer: Medicare HMO | Admitting: Anesthesiology

## 2017-02-14 ENCOUNTER — Encounter (HOSPITAL_COMMUNITY): Payer: Self-pay | Admitting: *Deleted

## 2017-02-14 ENCOUNTER — Ambulatory Visit (HOSPITAL_COMMUNITY)
Admission: RE | Admit: 2017-02-14 | Discharge: 2017-02-14 | Disposition: A | Discharge status: Home / Self Care | Payer: Medicare HMO | Source: Ambulatory Visit | Attending: Internal Medicine | Admitting: Internal Medicine

## 2017-02-14 ENCOUNTER — Encounter (HOSPITAL_COMMUNITY)
Admission: RE | Disposition: A | Discharge status: Home / Self Care | Payer: Self-pay | Source: Ambulatory Visit | Attending: Internal Medicine

## 2017-02-14 DIAGNOSIS — Z1211 Encounter for screening for malignant neoplasm of colon: Secondary | ICD-10-CM | POA: Insufficient documentation

## 2017-02-14 DIAGNOSIS — Z800 Family history of malignant neoplasm of digestive organs: Secondary | ICD-10-CM | POA: Diagnosis not present

## 2017-02-14 DIAGNOSIS — Z79899 Other long term (current) drug therapy: Secondary | ICD-10-CM | POA: Insufficient documentation

## 2017-02-14 DIAGNOSIS — J449 Chronic obstructive pulmonary disease, unspecified: Secondary | ICD-10-CM | POA: Insufficient documentation

## 2017-02-14 DIAGNOSIS — K2970 Gastritis, unspecified, without bleeding: Secondary | ICD-10-CM | POA: Diagnosis not present

## 2017-02-14 DIAGNOSIS — F319 Bipolar disorder, unspecified: Secondary | ICD-10-CM | POA: Insufficient documentation

## 2017-02-14 DIAGNOSIS — I10 Essential (primary) hypertension: Secondary | ICD-10-CM | POA: Insufficient documentation

## 2017-02-14 DIAGNOSIS — G4730 Sleep apnea, unspecified: Secondary | ICD-10-CM | POA: Insufficient documentation

## 2017-02-14 DIAGNOSIS — K210 Gastro-esophageal reflux disease with esophagitis: Secondary | ICD-10-CM | POA: Diagnosis not present

## 2017-02-14 DIAGNOSIS — K219 Gastro-esophageal reflux disease without esophagitis: Secondary | ICD-10-CM

## 2017-02-14 DIAGNOSIS — Z87891 Personal history of nicotine dependence: Secondary | ICD-10-CM | POA: Insufficient documentation

## 2017-02-14 DIAGNOSIS — Z9049 Acquired absence of other specified parts of digestive tract: Secondary | ICD-10-CM | POA: Insufficient documentation

## 2017-02-14 DIAGNOSIS — Z1212 Encounter for screening for malignant neoplasm of rectum: Secondary | ICD-10-CM | POA: Diagnosis not present

## 2017-02-14 DIAGNOSIS — R1310 Dysphagia, unspecified: Secondary | ICD-10-CM | POA: Diagnosis not present

## 2017-02-14 DIAGNOSIS — F1490 Cocaine use, unspecified, uncomplicated: Secondary | ICD-10-CM | POA: Diagnosis not present

## 2017-02-14 DIAGNOSIS — F1120 Opioid dependence, uncomplicated: Secondary | ICD-10-CM | POA: Diagnosis not present

## 2017-02-14 HISTORY — PX: COLONOSCOPY WITH PROPOFOL: SHX5780

## 2017-02-14 HISTORY — DX: Essential (primary) hypertension: I10

## 2017-02-14 HISTORY — PX: ESOPHAGOGASTRODUODENOSCOPY (EGD) WITH PROPOFOL: SHX5813

## 2017-02-14 HISTORY — PX: MALONEY DILATION: SHX5535

## 2017-02-14 LAB — RAPID URINE DRUG SCREEN, HOSP PERFORMED
AMPHETAMINES: NOT DETECTED
BENZODIAZEPINES: NOT DETECTED
Barbiturates: NOT DETECTED
COCAINE: NOT DETECTED
OPIATES: NOT DETECTED
Tetrahydrocannabinol: NOT DETECTED

## 2017-02-14 SURGERY — COLONOSCOPY WITH PROPOFOL
Anesthesia: Monitor Anesthesia Care

## 2017-02-14 MED ORDER — ONDANSETRON HCL 4 MG/2ML IJ SOLN
INTRAMUSCULAR | Status: AC
  Filled 2017-02-14: qty 2

## 2017-02-14 MED ORDER — PHENYLEPHRINE 40 MCG/ML (10ML) SYRINGE FOR IV PUSH (FOR BLOOD PRESSURE SUPPORT)
PREFILLED_SYRINGE | INTRAVENOUS | Status: AC
  Filled 2017-02-14: qty 10

## 2017-02-14 MED ORDER — CHLORHEXIDINE GLUCONATE CLOTH 2 % EX PADS: 6.0000 | MEDICATED_PAD | Freq: Once | CUTANEOUS | Status: DC

## 2017-02-14 MED ORDER — LIDOCAINE VISCOUS 2 % MT SOLN
OROMUCOSAL | Status: AC
  Filled 2017-02-14: qty 15

## 2017-02-14 MED ORDER — GLYCOPYRROLATE 0.2 MG/ML IJ SOLN
0.2000 mg | Freq: Once | INTRAMUSCULAR | Status: AC
Start: 2017-02-14 — End: 2017-02-14
  Administered 2017-02-14: 0.2 mg via INTRAVENOUS

## 2017-02-14 MED ORDER — SUCCINYLCHOLINE CHLORIDE 20 MG/ML IJ SOLN
INTRAMUSCULAR | Status: AC
  Filled 2017-02-14: qty 3

## 2017-02-14 MED ORDER — LACTATED RINGERS IV SOLN
INTRAVENOUS | Status: DC
  Administered 2017-02-14: 1000 mL via INTRAVENOUS

## 2017-02-14 MED ORDER — GLYCOPYRROLATE 0.2 MG/ML IJ SOLN
INTRAMUSCULAR | Status: AC
  Filled 2017-02-14: qty 1

## 2017-02-14 MED ORDER — LIDOCAINE HCL (PF) 1 % IJ SOLN
INTRAMUSCULAR | Status: AC
  Filled 2017-02-14: qty 5

## 2017-02-14 MED ORDER — PROPOFOL 10 MG/ML IV BOLUS
INTRAVENOUS | Status: AC
  Filled 2017-02-14: qty 60

## 2017-02-14 MED ORDER — LIDOCAINE VISCOUS 2 % MT SOLN
15.0000 mL | Freq: Once | OROMUCOSAL | Status: AC
  Administered 2017-02-14: 15 mL via OROMUCOSAL

## 2017-02-14 MED ORDER — SUCCINYLCHOLINE CHLORIDE 20 MG/ML IJ SOLN
INTRAMUSCULAR | Status: AC
  Filled 2017-02-14: qty 2

## 2017-02-14 MED ORDER — PROPOFOL 500 MG/50ML IV EMUL
INTRAVENOUS | Status: DC | PRN
  Administered 2017-02-14: 08:00:00 via INTRAVENOUS
  Administered 2017-02-14: 150 ug/kg/min via INTRAVENOUS

## 2017-02-14 MED ORDER — ONDANSETRON HCL 4 MG/2ML IJ SOLN
4.0000 mg | Freq: Once | INTRAMUSCULAR | Status: AC
  Administered 2017-02-14: 4 mg via INTRAVENOUS

## 2017-02-14 MED ORDER — MIDAZOLAM HCL 2 MG/2ML IJ SOLN
1.0000 mg | INTRAMUSCULAR | Status: AC
  Administered 2017-02-14: 2 mg via INTRAVENOUS

## 2017-02-14 MED ORDER — MIDAZOLAM HCL 2 MG/2ML IJ SOLN
INTRAMUSCULAR | Status: AC
  Filled 2017-02-14: qty 2

## 2017-02-14 MED ORDER — LIDOCAINE HCL (CARDIAC) 10 MG/ML IV SOLN
INTRAVENOUS | Status: DC | PRN
  Administered 2017-02-14: 30 mg via INTRAVENOUS

## 2017-02-14 NOTE — Op Note (Signed)
[facility] Patient Name: [name] [name] Procedure Date: 02/14/2017 7:45 AM MRN: [id number] Date of Birth: 1965-09-05 Attending MD: Norvel Richards , MD CSN: [id number] Age: 51 Admit Type: Outpatient Procedure:                Colonoscopy Indications:              Screening for colorectal malignant neoplasm Providers:                Norvel Richards, MD, Jeanann Lewandowsky. Sharon Seller, RN,                            Aram Candela Referring MD:             Rosita Fire MD, MD Medicines:                Propofol per Anesthesia Complications:            No immediate complications. Estimated Blood Loss:     Estimated blood loss: none. Procedure:                Pre-Anesthesia Assessment:                           - Prior to the procedure, a History and Physical                            was performed, and patient medications and                            allergies were reviewed. The patient's tolerance of                            previous anesthesia was also reviewed. The risks                            and benefits of the procedure and the sedation                            options and risks were discussed with the patient.                            All questions were answered, and informed consent                            was obtained. Prior Anticoagulants: The patient has                            taken no previous anticoagulant or antiplatelet                            agents. ASA Grade Assessment: II - A patient with                            mild systemic disease. After reviewing the risks  and benefits, the patient was deemed in                            satisfactory condition to undergo the procedure.                           After obtaining informed consent, the colonoscope                            was passed under direct vision. Throughout the                            procedure, the patient's blood pressure, pulse, and              oxygen saturations were monitored continuously. The                            [id number] ([id number]) scope was introduced through                            the anus and advanced to the the cecum, identified                            by appendiceal orifice and ileocecal valve. The                            colonoscopy was performed without difficulty. The                            patient tolerated the procedure well. The entire                            colon was well visualized. The ileocecal valve,                            appendiceal orifice, and rectum were photographed.                            The quality of the bowel preparation was adequate. Scope In: 7:49:39 AM Scope Out: 8:00:12 AM Scope Withdrawal Time: 0 hours 6 minutes 20 seconds  Total Procedure Duration: 0 hours 10 minutes 33 seconds  Findings:      The perianal and digital rectal examinations were normal.      The colon (entire examined portion) appeared normal.      No additional abnormalities were found on retroflexion. Impression:               - The entire examined colon is normal.                           - No specimens collected. Moderate Sedation:      Moderate (conscious) sedation was personally administered by an       anesthesia professional. The following parameters were monitored: oxygen       saturation, heart rate, blood pressure, respiratory rate, EKG, adequacy       of  pulmonary ventilation, and response to care. Total physician       intraservice time was 25 minutes. Recommendation:           - Patient has a contact number available for                            emergencies. The signs and symptoms of potential                            delayed complications were discussed with the                            patient. Return to normal activities tomorrow.                            Written discharge instructions were provided to the                            patient.                            - Resume previous diet.                           - Continue present medications.                           - Repeat colonoscopy in 10 years for screening                            purposes. See EGD report.                           - Return to GI office in 3 months. Procedure Code(s):        --- Professional ---                           [phone], Colonoscopy, flexible; diagnostic, including                            collection of specimen(s) by brushing or washing,                            when performed (separate procedure) Diagnosis Code(s):        --- Professional ---                           Z12.11, Encounter for screening for malignant                            neoplasm of colon CPT copyright 2016 American Medical Association. All rights reserved. The codes documented in this report are preliminary and upon coder review may  be revised to meet current compliance requirements. [name]. Meryn Sarracino, MD Norvel Richards, MD 02/14/2017 8:08:47 AM This report has been signed electronically. Number of Addenda: 0

## 2017-02-14 NOTE — Anesthesia Preprocedure Evaluation (Signed)
Anesthesia Evaluation  Patient identified by MRN, date of birth, ID band Patient awake    Reviewed: Allergy & Precautions, NPO status , Patient's Chart, lab work & pertinent test results  Airway Mallampati: II  TM Distance: >3 FB Neck ROM: Full    Dental  (+) Teeth Intact, Implants   Pulmonary shortness of breath, sleep apnea , COPD, former smoker,    breath sounds clear to auscultation       Cardiovascular hypertension, Pt. on medications  Rhythm:Regular Rate:Normal     Neuro/Psych  Headaches, PSYCHIATRIC DISORDERS Depression Bipolar Disorder  Neuromuscular disease    GI/Hepatic GERD  ,(+)     substance abuse  cocaine use,   Endo/Other    Renal/GU Renal disease     Musculoskeletal   Abdominal   Peds  Hematology   Anesthesia Other Findings Opiate dependence Cocaine use disorder  Reproductive/Obstetrics                             Anesthesia Physical Anesthesia Plan  ASA: III  Anesthesia Plan: MAC   Post-op Pain Management:    Induction: Intravenous  PONV Risk Score and Plan:   Airway Management Planned: Simple Face Mask  Additional Equipment:   Intra-op Plan:   Post-operative Plan:   Informed Consent: I have reviewed the patients History and Physical, chart, labs and discussed the procedure including the risks, benefits and alternatives for the proposed anesthesia with the patient or authorized representative who has indicated his/her understanding and acceptance.     Plan Discussed with:   Anesthesia Plan Comments:         Anesthesia Quick Evaluation

## 2017-02-14 NOTE — Anesthesia Procedure Notes (Signed)
Procedure Name: MAC Date/Time: 02/14/2017 7:33 AM Performed by: Andree Elk, AMY A Pre-anesthesia Checklist: Patient identified, Emergency Drugs available, Suction available, Patient being monitored and Timeout performed Oxygen Delivery Method: Simple face mask

## 2017-02-14 NOTE — Discharge Instructions (Addendum)
°Colonoscopy °Discharge Instructions ° °Read the instructions outlined below and refer to this sheet in the next few weeks. These discharge instructions provide you with general information on caring for yourself after you leave the hospital. Your doctor may also give you specific instructions. While your treatment has been planned according to the most current medical practices available, unavoidable complications occasionally occur. If you have any problems or questions after discharge, call Dr. Rourk at [phone]. °ACTIVITY °· You may resume your regular activity, but move at a slower pace for the next 24 hours.  °· Take frequent rest periods for the next 24 hours.  °· Walking will help get rid of the air and reduce the bloated feeling in your belly (abdomen).  °· No driving for 24 hours (because of the medicine (anesthesia) used during the test).   °· Do not sign any important legal documents or operate any machinery for 24 hours (because of the anesthesia used during the test).  °NUTRITION °· Drink plenty of fluids.  °· You may resume your normal diet as instructed by your doctor.  °· Begin with a light meal and progress to your normal diet. Heavy or fried foods are harder to digest and may make you feel sick to your stomach (nauseated).  °· Avoid alcoholic beverages for 24 hours or as instructed.  °MEDICATIONS °· You may resume your normal medications unless your doctor tells you otherwise.  °WHAT YOU CAN EXPECT TODAY °· Some feelings of bloating in the abdomen.  °· Passage of more gas than usual.  °· Spotting of blood in your stool or on the toilet paper.  °IF YOU HAD POLYPS REMOVED DURING THE COLONOSCOPY: °· No aspirin products for 7 days or as instructed.  °· No alcohol for 7 days or as instructed.  °· Eat a soft diet for the next 24 hours.  °FINDING OUT THE RESULTS OF YOUR TEST °Not all test results are available during your visit. If your test results are not back during the visit, make an appointment  with your caregiver to find out the results. Do not assume everything is normal if you have not heard from your caregiver or the medical facility. It is important for you to follow up on all of your test results.  °SEEK IMMEDIATE MEDICAL ATTENTION IF: °· You have more than a spotting of blood in your stool.  °· Your belly is swollen (abdominal distention).  °· You are nauseated or vomiting.  °· You have a temperature over 101.  °· You have abdominal pain or discomfort that is severe or gets worse throughout the day.  °EGD °Discharge instructions °Please read the instructions outlined below and refer to this sheet in the next few weeks. These discharge instructions provide you with general information on caring for yourself after you leave the hospital. Your doctor may also give you specific instructions. While your treatment has been planned according to the most current medical practices available, unavoidable complications occasionally occur. If you have any problems or questions after discharge, please call your doctor. °ACTIVITY °· You may resume your regular activity but move at a slower pace for the next 24 hours.  °· Take frequent rest periods for the next 24 hours.  °· Walking will help expel (get rid of) the air and reduce the bloated feeling in your abdomen.  °· No driving for 24 hours (because of the anesthesia (medicine) used during the test).  °· You may shower.  °· Do not sign any important   legal documents or operate any machinery for 24 hours (because of the anesthesia used during the test).  NUTRITION  Drink plenty of fluids.   You may resume your normal diet.   Begin with a light meal and progress to your normal diet.   Avoid alcoholic beverages for 24 hours or as instructed by your caregiver.  MEDICATIONS  You may resume your normal medications unless your caregiver tells you otherwise.  WHAT YOU CAN EXPECT TODAY  You may experience abdominal discomfort such as a feeling of fullness  or gas pains.  FOLLOW-UP  Your doctor will discuss the results of your test with you.  SEEK IMMEDIATE MEDICAL ATTENTION IF ANY OF THE FOLLOWING OCCUR:  Excessive nausea (feeling sick to your stomach) and/or vomiting.   Severe abdominal pain and distention (swelling).   Trouble swallowing.   Temperature over 101 F (37.8 C).   Rectal bleeding or vomiting of blood.    GERD information provided  Continue current medications  Repeat colonoscopy in 10 years for screening purposes  Office visit with [place] in 3 months

## 2017-02-14 NOTE — Anesthesia Postprocedure Evaluation (Signed)
Anesthesia Post Note  Patient: [name] [name]  Procedure(s) Performed: Procedure(s) (LRB): COLONOSCOPY WITH PROPOFOL (N/A) ESOPHAGOGASTRODUODENOSCOPY (EGD) WITH PROPOFOL (N/A) MALONEY DILATION (N/A)  Patient location during evaluation: PACU Anesthesia Type: MAC Level of consciousness: awake and alert and oriented Pain management: pain level controlled Vital Signs Assessment: post-procedure vital signs reviewed and stable Respiratory status: spontaneous breathing, respiratory function stable and patient connected to face mask oxygen Cardiovascular status: stable Postop Assessment: no signs of nausea or vomiting Anesthetic complications: no     Last Vitals:  Vitals:   02/14/17 0725 02/14/17 0730  BP: (!) 139/98   Pulse:    Resp: 13 16  Temp:      Last Pain:  Vitals:   02/14/17 0710  TempSrc: Oral                 ADAMS, AMY A

## 2017-02-14 NOTE — Transfer of Care (Signed)
Immediate Anesthesia Transfer of Care Note  Patient: [name] [name]  Procedure(s) Performed: Procedure(s) with comments: COLONOSCOPY WITH PROPOFOL (N/A) - 730  ESOPHAGOGASTRODUODENOSCOPY (EGD) WITH PROPOFOL (N/A) MALONEY DILATION (N/A)  Patient Location: PACU  Anesthesia Type:MAC  Level of Consciousness: awake, alert , oriented and patient cooperative  Airway & Oxygen Therapy: Patient Spontanous Breathing and Patient connected to face mask oxygen  Post-op Assessment: Report given to RN and Post -op Vital signs reviewed and stable  Post vital signs: Reviewed and stable  Last Vitals:  Vitals:   02/14/17 0725 02/14/17 0730  BP: (!) 139/98   Pulse:    Resp: 13 16  Temp:      Last Pain:  Vitals:   02/14/17 0710  TempSrc: Oral      Patients Stated Pain Goal: 4 (50/35/46 5681)  Complications: No apparent anesthesia complications

## 2017-02-14 NOTE — Op Note (Signed)
[facility] Patient Name: [name] [name] Procedure Date: 02/14/2017 7:04 AM MRN: [id number] Date of Birth: [birth year] Attending MD: Norvel Richards , MD CSN: [id number] Age: 51 Admit Type: Outpatient Procedure:                Upper GI endoscopy Indications:              Dysphagia Providers:                Norvel Richards, MD, Jeanann Lewandowsky. Sharon Seller, RN,                            Aram Candela Referring MD:             Rosita Fire MD, MD Medicines:                Propofol per Anesthesia Complications:            No immediate complications. Estimated Blood Loss:     Estimated blood loss was minimal. Procedure:                Pre-Anesthesia Assessment:                           - Prior to the procedure, a History and Physical                            was performed, and patient medications and                            allergies were reviewed. The patient's tolerance of                            previous anesthesia was also reviewed. The risks                            and benefits of the procedure and the sedation                            options and risks were discussed with the patient.                            All questions were answered, and informed consent                            was obtained. Prior Anticoagulants: The patient has                            taken no previous anticoagulant or antiplatelet                            agents. ASA Grade Assessment: II - A patient with                            mild systemic disease. After reviewing the risks  and benefits, the patient was deemed in                            satisfactory condition to undergo the procedure.                           After obtaining informed consent, the endoscope was                            passed under direct vision. Throughout the                            procedure, the patient's blood pressure, pulse, and                            oxygen  saturations were monitored continuously. The                            [id number] ([id number]) scope was introduced through the                            and advanced to the second part of duodenum. The                            upper GI endoscopy was accomplished without                            difficulty. The patient tolerated the procedure                            well. Scope In: 7:40:06 AM Scope Out: 7:44:59 AM Total Procedure Duration: 0 hours 4 minutes 53 seconds  Findings:      Esophagitis was found. 5 mm erosions at the GE junction. The esophagus       appeared patent throughout its course. No Barrett's epithelium.      The entire examined stomach was normal.      The duodenal bulb and second portion of the duodenum were normal. The       scope was withdrawn. Dilation was performed with a Maloney dilator with       mild resistance at 56 Fr. The dilation site was examined following       endoscope reinsertion and showed no change. Estimated blood loss was       minimal. Impression:               - Esophagitis. Dilated.                           - Normal stomach.                           - Normal duodenal bulb and second portion of the                            duodenum.                           -  No specimens collected. Moderate Sedation:      Moderate (conscious) sedation was personally administered by an       anesthesia professional. The following parameters were monitored: oxygen       saturation, heart rate, blood pressure, respiratory rate, EKG, adequacy       of pulmonary ventilation, and response to care. Total physician       intraservice time was 9 minutes. Recommendation:           - Patient has a contact number available for                            emergencies. The signs and symptoms of potential                            delayed complications were discussed with the                            patient. Return to normal activities tomorrow.                             Written discharge instructions were provided to the                            patient.                           - Continue present medications.                           - No repeat upper endoscopy.                           - Return to GI office in 3 months.                           - Patient has a contact number available for                            emergencies. The signs and symptoms of potential                            delayed complications were discussed with the                            patient. Return to normal activities tomorrow.                            Written discharge instructions were provided to the                            patient.                           - Resume previous diet. Procedure Code(s):        --- Professional ---  63893, Esophagogastroduodenoscopy, flexible,                            transoral; diagnostic, including collection of                            specimen(s) by brushing or washing, when performed                            (separate procedure)                           43450, Dilation of esophagus, by unguided sound or                            bougie, single or multiple passes Diagnosis Code(s):        --- Professional ---                           K20.9, Esophagitis, unspecified                           R13.10, Dysphagia, unspecified CPT copyright 2016 [employer] Association. All rights reserved. The codes documented in this report are preliminary and upon coder review may  be revised to meet current compliance requirements. [name]. [name], MD Norvel Richards, MD 02/14/2017 8:03:02 AM This report has been signed electronically. Number of Addenda: 0
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
## 2017-02-16 ENCOUNTER — Encounter (HOSPITAL_COMMUNITY): Payer: Self-pay | Admitting: Internal Medicine

## 2017-02-24 DIAGNOSIS — I10 Essential (primary) hypertension: Secondary | ICD-10-CM | POA: Diagnosis not present

## 2017-02-24 DIAGNOSIS — G4733 Obstructive sleep apnea (adult) (pediatric): Secondary | ICD-10-CM | POA: Diagnosis not present

## 2017-02-24 DIAGNOSIS — K210 Gastro-esophageal reflux disease with esophagitis: Secondary | ICD-10-CM | POA: Diagnosis not present

## 2017-02-24 DIAGNOSIS — J441 Chronic obstructive pulmonary disease with (acute) exacerbation: Secondary | ICD-10-CM | POA: Diagnosis not present

## 2017-03-03 DIAGNOSIS — J410 Simple chronic bronchitis: Secondary | ICD-10-CM | POA: Diagnosis not present

## 2017-03-03 DIAGNOSIS — M549 Dorsalgia, unspecified: Secondary | ICD-10-CM | POA: Diagnosis not present

## 2017-03-03 DIAGNOSIS — I10 Essential (primary) hypertension: Secondary | ICD-10-CM | POA: Diagnosis not present

## 2017-03-03 DIAGNOSIS — E785 Hyperlipidemia, unspecified: Secondary | ICD-10-CM | POA: Diagnosis not present

## 2017-03-09 DIAGNOSIS — F319 Bipolar disorder, unspecified: Secondary | ICD-10-CM | POA: Diagnosis not present

## 2017-04-19 DIAGNOSIS — S32009D Unspecified fracture of unspecified lumbar vertebra, subsequent encounter for fracture with routine healing: Secondary | ICD-10-CM | POA: Diagnosis not present

## 2017-04-19 DIAGNOSIS — G039 Meningitis, unspecified: Secondary | ICD-10-CM | POA: Diagnosis not present

## 2017-04-19 DIAGNOSIS — R52 Pain, unspecified: Secondary | ICD-10-CM | POA: Diagnosis not present

## 2017-05-17 ENCOUNTER — Ambulatory Visit (INDEPENDENT_AMBULATORY_CARE_PROVIDER_SITE_OTHER): Payer: Medicare HMO | Admitting: Gastroenterology

## 2017-05-17 ENCOUNTER — Encounter: Payer: Self-pay | Admitting: Gastroenterology

## 2017-05-17 VITALS — BP 136/90 | HR 75 | Temp 96.3°F | Ht 67.0 in | Wt 215.8 lb

## 2017-05-17 DIAGNOSIS — K2100 Gastro-esophageal reflux disease with esophagitis, without bleeding: Secondary | ICD-10-CM

## 2017-05-17 MED ORDER — PANTOPRAZOLE SODIUM 40 MG PO TBEC: 40.0000 mg | DELAYED_RELEASE_TABLET | Freq: Every day | ORAL | 3 refills | Status: DC

## 2017-05-17 NOTE — Patient Instructions (Addendum)
Stop Prilosec. Start taking Protonix once each morning, 30 minutes before breakfast. I sent this to the pharmacy.  You can take Zantac as needed at bedtime. This best works when taken only as needed  Don't eat 3 hours before laying down for sleep.    Food Choices for Gastroesophageal Reflux Disease, Adult When you have gastroesophageal reflux disease (GERD), the foods you eat and your eating habits are very important. Choosing the right foods can help ease your discomfort. What guidelines do I need to follow?  Choose fruits, vegetables, whole grains, and low-fat dairy products.  Choose low-fat meat, fish, and poultry.  Limit fats such as oils, salad dressings, butter, nuts, and avocado.  Keep a food diary. This helps you identify foods that cause symptoms.  Avoid foods that cause symptoms. These may be different for everyone.  Eat small meals often instead of 3 large meals a day.  Eat your meals slowly, in a place where you are relaxed.  Limit fried foods.  Cook foods using methods other than frying.  Avoid drinking alcohol.  Avoid drinking large amounts of liquids with your meals.  Avoid bending over or lying down until 2-3 hours after eating. What foods are not recommended? These are some foods and drinks that may make your symptoms worse: Vegetables Tomatoes. Tomato juice. Tomato and spaghetti sauce. Chili peppers. Onion and garlic. Horseradish. Fruits Oranges, grapefruit, and lemon (fruit and juice). Meats High-fat meats, fish, and poultry. This includes hot dogs, ribs, ham, sausage, salami, and bacon. Dairy Whole milk and chocolate milk. Sour cream. Cream. Butter. Ice cream. Cream cheese. Drinks Coffee and tea. Bubbly (carbonated) drinks or energy drinks. Condiments Hot sauce. Barbecue sauce. Sweets/Desserts Chocolate and cocoa. Donuts. Peppermint and spearmint. Fats and Oils High-fat foods. This includes [place] fries and potato chips. Other Vinegar. Strong  spices. This includes black pepper, white pepper, red pepper, cayenne, curry powder, cloves, ginger, and chili powder. The items listed above may not be a complete list of foods and drinks to avoid. Contact your dietitian for more information. This information is not intended to replace advice given to you by your health care provider. Make sure you discuss any questions you have with your health care provider. Document Released: 01/18/2012 Document Revised: 12/25/2015 Document Reviewed: 05/23/2013 Elsevier Interactive Patient Education  2017 [employer].

## 2017-05-17 NOTE — Progress Notes (Signed)
CC'D TO PCP °
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
## 2017-05-17 NOTE — Assessment & Plan Note (Signed)
51 year old male with esophagitis on EGD, empiric dilatation, chronically on Prilosec. Notes improvement in solid food dysphagia but still with pill dysphagia. Some exacerbation of GERD at night likely behavior-related. Discussed avoidance of eating late at night and provided GERD diet handout. Will trial Protonix now, as he has been on Prilosec chronically. Use Zantac only as needed. Return in 3 months.  Next colonoscopy in 10 years.

## 2017-05-30 DIAGNOSIS — Z23 Encounter for immunization: Secondary | ICD-10-CM | POA: Diagnosis not present

## 2017-05-30 DIAGNOSIS — I10 Essential (primary) hypertension: Secondary | ICD-10-CM | POA: Diagnosis not present

## 2017-05-30 DIAGNOSIS — I781 Nevus, non-neoplastic: Secondary | ICD-10-CM | POA: Diagnosis not present

## 2017-05-30 DIAGNOSIS — F331 Major depressive disorder, recurrent, moderate: Secondary | ICD-10-CM | POA: Diagnosis not present

## 2017-05-30 DIAGNOSIS — E785 Hyperlipidemia, unspecified: Secondary | ICD-10-CM | POA: Diagnosis not present

## 2017-06-01 DIAGNOSIS — L820 Inflamed seborrheic keratosis: Secondary | ICD-10-CM | POA: Diagnosis not present

## 2017-06-14 DIAGNOSIS — I10 Essential (primary) hypertension: Secondary | ICD-10-CM | POA: Diagnosis not present

## 2017-06-14 DIAGNOSIS — G8929 Other chronic pain: Secondary | ICD-10-CM | POA: Diagnosis not present

## 2017-06-14 DIAGNOSIS — Z79899 Other long term (current) drug therapy: Secondary | ICD-10-CM | POA: Diagnosis not present

## 2017-06-14 DIAGNOSIS — M545 Low back pain: Secondary | ICD-10-CM | POA: Diagnosis not present

## 2017-06-14 DIAGNOSIS — G039 Meningitis, unspecified: Secondary | ICD-10-CM | POA: Diagnosis not present

## 2017-06-21 DIAGNOSIS — M5136 Other intervertebral disc degeneration, lumbar region: Secondary | ICD-10-CM | POA: Diagnosis not present

## 2017-06-21 DIAGNOSIS — M545 Low back pain: Secondary | ICD-10-CM | POA: Diagnosis not present

## 2017-06-21 DIAGNOSIS — G8929 Other chronic pain: Secondary | ICD-10-CM | POA: Diagnosis not present

## 2017-06-21 DIAGNOSIS — Z1389 Encounter for screening for other disorder: Secondary | ICD-10-CM | POA: Diagnosis not present

## 2017-06-28 DIAGNOSIS — F319 Bipolar disorder, unspecified: Secondary | ICD-10-CM | POA: Diagnosis not present

## 2017-07-21 DIAGNOSIS — Z79899 Other long term (current) drug therapy: Secondary | ICD-10-CM | POA: Diagnosis not present

## 2017-07-21 DIAGNOSIS — M5136 Other intervertebral disc degeneration, lumbar region: Secondary | ICD-10-CM | POA: Diagnosis not present

## 2017-08-17 ENCOUNTER — Encounter: Payer: Self-pay | Admitting: Gastroenterology

## 2017-08-17 ENCOUNTER — Ambulatory Visit: Payer: Medicare HMO | Admitting: Gastroenterology

## 2017-08-17 VITALS — BP 124/82 | HR 78 | Temp 97.9°F | Ht 67.0 in | Wt 228.4 lb

## 2017-08-17 DIAGNOSIS — K2100 Gastro-esophageal reflux disease with esophagitis, without bleeding: Secondary | ICD-10-CM

## 2017-08-17 NOTE — Patient Instructions (Addendum)
Continue taking Protonix once daily, 30 minutes before breakfast.  I am glad you are doing well! Please call if any recurrent issues swallowing or issues with reflux.   We will see you back in 1 year!  It was a pleasure to see you today. I strive to create trusting relationships with patients to provide genuine, compassionate, and quality care. I value your feedback. If you receive a survey regarding your visit,  I greatly appreciate you the taking time to fill this out.   Annitta Needs, PhD, ANP-BC [facility]    Food Choices for Gastroesophageal Reflux Disease, Adult When you have gastroesophageal reflux disease (GERD), the foods you eat and your eating habits are very important. Choosing the right foods can help ease your discomfort. What guidelines do I need to follow?  Choose fruits, vegetables, whole grains, and low-fat dairy products.  Choose low-fat meat, fish, and poultry.  Limit fats such as oils, salad dressings, butter, nuts, and avocado.  Keep a food diary. This helps you identify foods that cause symptoms.  Avoid foods that cause symptoms. These may be different for everyone.  Eat small meals often instead of 3 large meals a day.  Eat your meals slowly, in a place where you are relaxed.  Limit fried foods.  Cook foods using methods other than frying.  Avoid drinking alcohol.  Avoid drinking large amounts of liquids with your meals.  Avoid bending over or lying down until 2-3 hours after eating. What foods are not recommended? These are some foods and drinks that may make your symptoms worse: Vegetables Tomatoes. Tomato juice. Tomato and spaghetti sauce. Chili peppers. Onion and garlic. Horseradish. Fruits Oranges, grapefruit, and lemon (fruit and juice). Meats High-fat meats, fish, and poultry. This includes hot dogs, ribs, ham, sausage, salami, and bacon. Dairy Whole milk and chocolate milk. Sour cream. Cream. Butter. Ice cream. Cream  cheese. Drinks Coffee and tea. Bubbly (carbonated) drinks or energy drinks. Condiments Hot sauce. Barbecue sauce. Sweets/Desserts Chocolate and cocoa. Donuts. Peppermint and spearmint. Fats and Oils High-fat foods. This includes [place] fries and potato chips. Other Vinegar. Strong spices. This includes black pepper, white pepper, red pepper, cayenne, curry powder, cloves, ginger, and chili powder. The items listed above may not be a complete list of foods and drinks to avoid. Contact your dietitian for more information. This information is not intended to replace advice given to you by your health care provider. Make sure you discuss any questions you have with your health care provider. Document Released: 01/18/2012 Document Revised: 12/25/2015 Document Reviewed: 05/23/2013 Elsevier Interactive Patient Education  2017 [employer].
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
## 2017-08-18 NOTE — Progress Notes (Signed)
CC'D TO PCP °

## 2017-08-18 NOTE — Assessment & Plan Note (Signed)
With esophagitis, noted on EGD. Empiric dilation performed July 2018. Dysphagia resolved, doing well on Protonix once daily. No GI concerns today. Return in 1 year. Colonoscopy in 2028.

## 2017-08-19 DIAGNOSIS — M5136 Other intervertebral disc degeneration, lumbar region: Secondary | ICD-10-CM | POA: Diagnosis not present

## 2017-08-19 DIAGNOSIS — G8929 Other chronic pain: Secondary | ICD-10-CM | POA: Diagnosis not present

## 2017-08-19 DIAGNOSIS — M545 Low back pain: Secondary | ICD-10-CM | POA: Diagnosis not present

## 2017-08-31 DIAGNOSIS — I10 Essential (primary) hypertension: Secondary | ICD-10-CM | POA: Diagnosis not present

## 2017-08-31 DIAGNOSIS — E785 Hyperlipidemia, unspecified: Secondary | ICD-10-CM | POA: Diagnosis not present

## 2017-08-31 DIAGNOSIS — F331 Major depressive disorder, recurrent, moderate: Secondary | ICD-10-CM | POA: Diagnosis not present

## 2017-09-16 DIAGNOSIS — H538 Other visual disturbances: Secondary | ICD-10-CM | POA: Diagnosis not present

## 2017-09-16 DIAGNOSIS — I10 Essential (primary) hypertension: Secondary | ICD-10-CM | POA: Diagnosis not present

## 2017-09-16 DIAGNOSIS — G4733 Obstructive sleep apnea (adult) (pediatric): Secondary | ICD-10-CM | POA: Diagnosis not present

## 2017-09-16 DIAGNOSIS — J449 Chronic obstructive pulmonary disease, unspecified: Secondary | ICD-10-CM | POA: Diagnosis not present

## 2017-09-19 DIAGNOSIS — M5136 Other intervertebral disc degeneration, lumbar region: Secondary | ICD-10-CM | POA: Diagnosis not present

## 2017-09-19 DIAGNOSIS — M545 Low back pain: Secondary | ICD-10-CM | POA: Diagnosis not present

## 2017-09-19 DIAGNOSIS — G8929 Other chronic pain: Secondary | ICD-10-CM | POA: Diagnosis not present

## 2017-09-19 DIAGNOSIS — Z79899 Other long term (current) drug therapy: Secondary | ICD-10-CM | POA: Diagnosis not present

## 2017-10-16 ENCOUNTER — Ambulatory Visit (HOSPITAL_COMMUNITY)
Admission: EM | Admit: 2017-10-16 | Discharge: 2017-10-16 | Disposition: A | Discharge status: Home / Self Care | Payer: Medicare HMO | Attending: Emergency Medicine | Admitting: Emergency Medicine

## 2017-10-16 ENCOUNTER — Emergency Department (HOSPITAL_COMMUNITY): Payer: Medicare HMO | Admitting: Certified Registered Nurse Anesthetist

## 2017-10-16 ENCOUNTER — Emergency Department (HOSPITAL_COMMUNITY): Payer: Medicare HMO

## 2017-10-16 ENCOUNTER — Encounter (HOSPITAL_COMMUNITY): Payer: Self-pay | Admitting: Emergency Medicine

## 2017-10-16 ENCOUNTER — Other Ambulatory Visit: Payer: Self-pay

## 2017-10-16 ENCOUNTER — Ambulatory Visit: Admit: 2017-10-16 | Payer: Medicare HMO | Admitting: Student

## 2017-10-16 ENCOUNTER — Encounter (HOSPITAL_COMMUNITY)
Admission: EM | Disposition: A | Discharge status: Home / Self Care | Payer: Self-pay | Source: Home / Self Care | Attending: Emergency Medicine

## 2017-10-16 DIAGNOSIS — Z87891 Personal history of nicotine dependence: Secondary | ICD-10-CM | POA: Insufficient documentation

## 2017-10-16 DIAGNOSIS — S61401A Unspecified open wound of right hand, initial encounter: Secondary | ICD-10-CM | POA: Diagnosis not present

## 2017-10-16 DIAGNOSIS — E785 Hyperlipidemia, unspecified: Secondary | ICD-10-CM | POA: Diagnosis not present

## 2017-10-16 DIAGNOSIS — M1990 Unspecified osteoarthritis, unspecified site: Secondary | ICD-10-CM | POA: Diagnosis not present

## 2017-10-16 DIAGNOSIS — Y93H1 Activity, digging, shoveling and raking: Secondary | ICD-10-CM | POA: Diagnosis not present

## 2017-10-16 DIAGNOSIS — S63264A Dislocation of metacarpophalangeal joint of right ring finger, initial encounter: Secondary | ICD-10-CM | POA: Diagnosis not present

## 2017-10-16 DIAGNOSIS — F319 Bipolar disorder, unspecified: Secondary | ICD-10-CM | POA: Insufficient documentation

## 2017-10-16 DIAGNOSIS — S66901A Unspecified injury of unspecified muscle, fascia and tendon at wrist and hand level, right hand, initial encounter: Secondary | ICD-10-CM

## 2017-10-16 DIAGNOSIS — K219 Gastro-esophageal reflux disease without esophagitis: Secondary | ICD-10-CM | POA: Insufficient documentation

## 2017-10-16 DIAGNOSIS — S61419A Laceration without foreign body of unspecified hand, initial encounter: Secondary | ICD-10-CM | POA: Diagnosis not present

## 2017-10-16 DIAGNOSIS — S66929A Laceration of unspecified muscle, fascia and tendon at wrist and hand level, unspecified hand, initial encounter: Secondary | ICD-10-CM | POA: Diagnosis not present

## 2017-10-16 DIAGNOSIS — Z79891 Long term (current) use of opiate analgesic: Secondary | ICD-10-CM | POA: Insufficient documentation

## 2017-10-16 DIAGNOSIS — S56521A Laceration of other extensor muscle, fascia and tendon at forearm level, right arm, initial encounter: Secondary | ICD-10-CM | POA: Diagnosis not present

## 2017-10-16 DIAGNOSIS — Z79899 Other long term (current) drug therapy: Secondary | ICD-10-CM | POA: Diagnosis not present

## 2017-10-16 DIAGNOSIS — Z791 Long term (current) use of non-steroidal anti-inflammatories (NSAID): Secondary | ICD-10-CM | POA: Insufficient documentation

## 2017-10-16 DIAGNOSIS — Z885 Allergy status to narcotic agent status: Secondary | ICD-10-CM | POA: Insufficient documentation

## 2017-10-16 DIAGNOSIS — S66324A Laceration of extensor muscle, fascia and tendon of right ring finger at wrist and hand level, initial encounter: Secondary | ICD-10-CM | POA: Insufficient documentation

## 2017-10-16 DIAGNOSIS — S61411A Laceration without foreign body of right hand, initial encounter: Secondary | ICD-10-CM

## 2017-10-16 DIAGNOSIS — S62394B Other fracture of fourth metacarpal bone, right hand, initial encounter for open fracture: Secondary | ICD-10-CM | POA: Diagnosis not present

## 2017-10-16 DIAGNOSIS — I10 Essential (primary) hypertension: Secondary | ICD-10-CM | POA: Diagnosis not present

## 2017-10-16 DIAGNOSIS — J449 Chronic obstructive pulmonary disease, unspecified: Secondary | ICD-10-CM | POA: Insufficient documentation

## 2017-10-16 DIAGNOSIS — S61204A Unspecified open wound of right ring finger without damage to nail, initial encounter: Secondary | ICD-10-CM | POA: Diagnosis not present

## 2017-10-16 DIAGNOSIS — S62304A Unspecified fracture of fourth metacarpal bone, right hand, initial encounter for closed fracture: Secondary | ICD-10-CM | POA: Diagnosis not present

## 2017-10-16 DIAGNOSIS — W228XXA Striking against or struck by other objects, initial encounter: Secondary | ICD-10-CM | POA: Insufficient documentation

## 2017-10-16 HISTORY — PX: TENDON EXPLORATION: SHX5112

## 2017-10-16 LAB — BASIC METABOLIC PANEL
Anion gap: 12 (ref 5–15)
BUN: 16 mg/dL (ref 6–20)
CALCIUM: 9.5 mg/dL (ref 8.9–10.3)
CO2: 23 mmol/L (ref 22–32)
CREATININE: 0.97 mg/dL (ref 0.61–1.24)
Chloride: 105 mmol/L (ref 101–111)
GFR calc Af Amer: >60 mL/min (ref >60)
GLUCOSE: 102 mg/dL — AB (ref 65–99)
Potassium: 3.8 mmol/L (ref 3.5–5.1)
Sodium: 140 mmol/L (ref 135–145)

## 2017-10-16 LAB — CBC WITH DIFFERENTIAL/PLATELET
Basophils Absolute: 0 10*3/uL (ref 0.0–0.1)
Basophils Relative: 0 %
EOS ABS: 0.1 10*3/uL (ref 0.0–0.7)
Eosinophils Relative: 1 %
HEMATOCRIT: 44.6 % (ref 39.0–52.0)
HEMOGLOBIN: 15.4 g/dL (ref 13.0–17.0)
LYMPHS ABS: 2.5 10*3/uL (ref 0.7–4.0)
Lymphocytes Relative: 26 %
MCH: 30 pg (ref 26.0–34.0)
MCHC: 34.5 g/dL (ref 30.0–36.0)
MCV: 86.8 fL (ref 78.0–100.0)
MONOS PCT: 7 %
Monocytes Absolute: 0.7 10*3/uL (ref 0.1–1.0)
NEUTROS ABS: 6.7 10*3/uL (ref 1.7–7.7)
NEUTROS PCT: 66 %
PLATELETS: 227 10*3/uL (ref 150–400)
RBC: 5.14 MIL/uL (ref 4.22–5.81)
RDW: 12 % (ref 11.5–15.5)
WBC: 9.9 10*3/uL (ref 4.0–10.5)

## 2017-10-16 SURGERY — EXPLORATION, TENDON
Anesthesia: General | Site: Hand | Laterality: Right

## 2017-10-16 MED ORDER — PROPOFOL 10 MG/ML IV BOLUS
INTRAVENOUS | Status: DC | PRN
  Administered 2017-10-16: 200 mg via INTRAVENOUS

## 2017-10-16 MED ORDER — MORPHINE SULFATE (PF) 4 MG/ML IV SOLN
INTRAVENOUS | Status: AC
  Filled 2017-10-16: qty 1

## 2017-10-16 MED ORDER — LIDOCAINE HCL (CARDIAC) 20 MG/ML IV SOLN
INTRAVENOUS | Status: DC | PRN
  Administered 2017-10-16: 60 mg via INTRAVENOUS

## 2017-10-16 MED ORDER — SODIUM CHLORIDE 0.9 % IR SOLN
Status: DC | PRN
  Administered 2017-10-16: 3000 mL

## 2017-10-16 MED ORDER — MORPHINE SULFATE (PF) 4 MG/ML IV SOLN
INTRAVENOUS | Status: AC
  Administered 2017-10-16: 2 mg via INTRAVENOUS
  Filled 2017-10-16: qty 1

## 2017-10-16 MED ORDER — OXYCODONE HCL 5 MG PO TABS: 10.0000 mg | ORAL_TABLET | Freq: Once | ORAL | Status: DC | PRN

## 2017-10-16 MED ORDER — VANCOMYCIN HCL 1000 MG IV SOLR
INTRAVENOUS | Status: AC
  Filled 2017-10-16: qty 1000

## 2017-10-16 MED ORDER — PHENYLEPHRINE HCL 10 MG/ML IJ SOLN
INTRAMUSCULAR | Status: DC | PRN
Start: 2017-10-16 — End: 2017-10-16
  Administered 2017-10-16 (×4): 40 ug via INTRAVENOUS

## 2017-10-16 MED ORDER — POVIDONE-IODINE 10 % EX SOLN
CUTANEOUS | Status: AC
  Filled 2017-10-16: qty 15

## 2017-10-16 MED ORDER — BACITRACIN ZINC 500 UNIT/GM EX OINT
TOPICAL_OINTMENT | CUTANEOUS | Status: AC
  Filled 2017-10-16: qty 28.35

## 2017-10-16 MED ORDER — MIDAZOLAM HCL 5 MG/5ML IJ SOLN
INTRAMUSCULAR | Status: DC | PRN
  Administered 2017-10-16 (×2): 1 mg via INTRAVENOUS

## 2017-10-16 MED ORDER — ROCURONIUM BROMIDE 100 MG/10ML IV SOLN
INTRAVENOUS | Status: DC | PRN
Start: 2017-10-16 — End: 2017-10-16
  Administered 2017-10-16: 40 mg via INTRAVENOUS

## 2017-10-16 MED ORDER — MIDAZOLAM HCL 2 MG/2ML IJ SOLN
INTRAMUSCULAR | Status: AC
  Filled 2017-10-16: qty 2

## 2017-10-16 MED ORDER — ONDANSETRON HCL 4 MG/2ML IJ SOLN
INTRAMUSCULAR | Status: DC | PRN
  Administered 2017-10-16: 4 mg via INTRAVENOUS

## 2017-10-16 MED ORDER — OXYCODONE-ACETAMINOPHEN 5-325 MG PO TABS: 1.0000 | ORAL_TABLET | ORAL | 0 refills | Status: DC | PRN

## 2017-10-16 MED ORDER — OXYCODONE HCL 5 MG/5ML PO SOLN: 5.0000 mg | Freq: Once | ORAL | Status: DC | PRN

## 2017-10-16 MED ORDER — VANCOMYCIN HCL 500 MG IV SOLR
INTRAVENOUS | Status: AC
  Filled 2017-10-16: qty 500

## 2017-10-16 MED ORDER — TETANUS-DIPHTH-ACELL PERTUSSIS 5-2.5-18.5 LF-MCG/0.5 IM SUSP
0.5000 mL | Freq: Once | INTRAMUSCULAR | Status: AC
  Administered 2017-10-16: 0.5 mL via INTRAMUSCULAR
  Filled 2017-10-16: qty 0.5

## 2017-10-16 MED ORDER — CEFAZOLIN SODIUM-DEXTROSE 2-4 GM/100ML-% IV SOLN
INTRAVENOUS | Status: AC
  Filled 2017-10-16: qty 100

## 2017-10-16 MED ORDER — DEXAMETHASONE SODIUM PHOSPHATE 10 MG/ML IJ SOLN
INTRAMUSCULAR | Status: DC | PRN
  Administered 2017-10-16: 10 mg via INTRAVENOUS

## 2017-10-16 MED ORDER — LACTATED RINGERS IV SOLN
INTRAVENOUS | Status: DC | PRN
  Administered 2017-10-16: 15:00:00 via INTRAVENOUS

## 2017-10-16 MED ORDER — VANCOMYCIN HCL 1000 MG IV SOLR
INTRAVENOUS | Status: DC | PRN
  Administered 2017-10-16: 1000 mg

## 2017-10-16 MED ORDER — MORPHINE SULFATE (PF) 4 MG/ML IV SOLN
2.0000 mg | INTRAVENOUS | Status: DC | PRN
  Administered 2017-10-16 (×2): 2 mg via INTRAVENOUS

## 2017-10-16 MED ORDER — ONDANSETRON HCL 4 MG/2ML IJ SOLN
4.0000 mg | Freq: Once | INTRAMUSCULAR | Status: AC
Start: 2017-10-16 — End: 2017-10-16
  Administered 2017-10-16: 4 mg via INTRAVENOUS
  Filled 2017-10-16: qty 2

## 2017-10-16 MED ORDER — MORPHINE SULFATE 10 MG/ML IJ SOLN
INTRAMUSCULAR | Status: DC | PRN
  Administered 2017-10-16 (×4): 2 mg via INTRAVENOUS

## 2017-10-16 MED ORDER — 0.9 % SODIUM CHLORIDE (POUR BTL) OPTIME
TOPICAL | Status: DC | PRN
  Administered 2017-10-16: 1000 mL

## 2017-10-16 MED ORDER — MORPHINE SULFATE (PF) 4 MG/ML IV SOLN
4.0000 mg | Freq: Once | INTRAVENOUS | Status: AC
  Administered 2017-10-16: 4 mg via INTRAVENOUS
  Filled 2017-10-16: qty 1

## 2017-10-16 MED ORDER — LIDOCAINE HCL (PF) 1 % IJ SOLN
INTRAMUSCULAR | Status: AC
  Filled 2017-10-16: qty 10

## 2017-10-16 MED ORDER — SUGAMMADEX SODIUM 200 MG/2ML IV SOLN
INTRAVENOUS | Status: DC | PRN
  Administered 2017-10-16: 200 mg via INTRAVENOUS

## 2017-10-16 MED ORDER — SUCCINYLCHOLINE CHLORIDE 20 MG/ML IJ SOLN
INTRAMUSCULAR | Status: DC | PRN
  Administered 2017-10-16: 80 mg via INTRAVENOUS

## 2017-10-16 MED ORDER — CEFAZOLIN SODIUM-DEXTROSE 2-3 GM-%(50ML) IV SOLR
INTRAVENOUS | Status: DC | PRN
  Administered 2017-10-16: 2 g via INTRAVENOUS

## 2017-10-16 SURGICAL SUPPLY — 52 items
BANDAGE ACE 3X5.8 VEL STRL LF (GAUZE/BANDAGES/DRESSINGS) IMPLANT
BANDAGE ACE 4X5 VEL STRL LF (GAUZE/BANDAGES/DRESSINGS) IMPLANT
BANDAGE ELASTIC 3 VELCRO ST LF (GAUZE/BANDAGES/DRESSINGS) ×3 IMPLANT
BNDG COHESIVE 1X5 TAN STRL LF (GAUZE/BANDAGES/DRESSINGS) IMPLANT
BNDG GAUZE ELAST 4 BULKY (GAUZE/BANDAGES/DRESSINGS) ×6 IMPLANT
CAST PADDING SYN 3 (CAST SUPPLIES) ×3 IMPLANT
CORDS BIPOLAR (ELECTRODE) ×3 IMPLANT
COVER SURGICAL LIGHT HANDLE (MISCELLANEOUS) ×3 IMPLANT
CUFF TOURNIQUET SINGLE 18IN (TOURNIQUET CUFF) ×3 IMPLANT
CUFF TOURNIQUET SINGLE 24IN (TOURNIQUET CUFF) IMPLANT
DECANTER SPIKE VIAL GLASS SM (MISCELLANEOUS) ×3 IMPLANT
DRAPE SURG 17X23 STRL (DRAPES) ×3 IMPLANT
DRESSING ADAPTIC 1/2  N-ADH (PACKING) ×3 IMPLANT
GAUZE SPONGE 2X2 8PLY STRL LF (GAUZE/BANDAGES/DRESSINGS) IMPLANT
GAUZE SPONGE 4X4 12PLY STRL (GAUZE/BANDAGES/DRESSINGS) IMPLANT
GAUZE SPONGE 4X4 16PLY XRAY LF (GAUZE/BANDAGES/DRESSINGS) ×3 IMPLANT
GAUZE XEROFORM 1X8 LF (GAUZE/BANDAGES/DRESSINGS) IMPLANT
GLOVE BIOGEL M 8.0 STRL (GLOVE) ×3 IMPLANT
GLOVE SS BIOGEL STRL SZ 8 (GLOVE) ×1 IMPLANT
GLOVE SUPERSENSE BIOGEL SZ 8 (GLOVE) ×2
GOWN STRL REUS W/ TWL LRG LVL3 (GOWN DISPOSABLE) ×2 IMPLANT
GOWN STRL REUS W/ TWL XL LVL3 (GOWN DISPOSABLE) ×3 IMPLANT
GOWN STRL REUS W/TWL LRG LVL3 (GOWN DISPOSABLE) ×4
GOWN STRL REUS W/TWL XL LVL3 (GOWN DISPOSABLE) ×6
KIT BASIN OR (CUSTOM PROCEDURE TRAY) ×3 IMPLANT
KIT ROOM TURNOVER OR (KITS) ×3 IMPLANT
MANIFOLD NEPTUNE II (INSTRUMENTS) ×3 IMPLANT
NEEDLE HYPO 25GX1X1/2 BEV (NEEDLE) IMPLANT
NS IRRIG 1000ML POUR BTL (IV SOLUTION) ×3 IMPLANT
PACK ORTHO EXTREMITY (CUSTOM PROCEDURE TRAY) ×3 IMPLANT
PAD ARMBOARD 7.5X6 YLW CONV (MISCELLANEOUS) ×6 IMPLANT
PAD CAST 4YDX4 CTTN HI CHSV (CAST SUPPLIES) IMPLANT
PADDING CAST COTTON 4X4 STRL (CAST SUPPLIES)
SCRUB BETADINE 4OZ XXX (MISCELLANEOUS) ×3 IMPLANT
SOL PREP POV-IOD 4OZ 10% (MISCELLANEOUS) ×6 IMPLANT
SPECIMEN JAR SMALL (MISCELLANEOUS) ×3 IMPLANT
SPONGE GAUZE 2X2 STER 10/PKG (GAUZE/BANDAGES/DRESSINGS)
SUCTION FRAZIER HANDLE 10FR (MISCELLANEOUS)
SUCTION TUBE FRAZIER 10FR DISP (MISCELLANEOUS) IMPLANT
SUT ETHILON 3 0 PS 1 (SUTURE) ×3 IMPLANT
SUT MERSILENE 4 0 P 3 (SUTURE) IMPLANT
SUT MON AB 2-0 CT1 36 (SUTURE) ×3 IMPLANT
SUT PROLENE 4 0 PS 2 18 (SUTURE) IMPLANT
SUT VIC AB 2-0 CT1 27 (SUTURE)
SUT VIC AB 2-0 CT1 TAPERPNT 27 (SUTURE) IMPLANT
SYR CONTROL 10ML LL (SYRINGE) IMPLANT
TOWEL OR 17X24 6PK STRL BLUE (TOWEL DISPOSABLE) ×3 IMPLANT
TOWEL OR 17X26 10 PK STRL BLUE (TOWEL DISPOSABLE) ×3 IMPLANT
TUBE CONNECTING 12'X1/4 (SUCTIONS)
TUBE CONNECTING 12X1/4 (SUCTIONS) IMPLANT
UNDERPAD 30X30 (UNDERPADS AND DIAPERS) ×3 IMPLANT
WATER STERILE IRR 1000ML POUR (IV SOLUTION) ×3 IMPLANT

## 2017-10-16 NOTE — Op Note (Signed)
OrthopaedicSurgeryOperativeNote (FXT:[id number]) Date of Surgery: 10/16/2017  Admit Date: 10/16/2017   Diagnoses: Pre-Op Diagnoses: Right hand laceration  Post-Op Diagnosis: Partial avulsion of right ring finger extensor tendon Open right ring finger MCP arthrotomy Avulsion fracture of 4th metacarpal head  Procedures: 1. CPT 25109-Partial excision of right ring finger tendon avulsion 2. CPT 26070-Exploration and I&D of right ring finger traumatic arthrotomy 3. CPT 26600-Closed treatment of metacarpal head fracture 4. CPT 12002-Simple repair of traumatic laceration of hand  Surgeons: Primary: Shona Needles, MD   Location:[facility] OR ROOM 08   AnesthesiaGeneral   Antibiotics:Ancef 2g preop  Tourniquettime: Total Tourniquet Time Documented: Upper Arm (Right) - 25 minutes Total: Upper Arm (Right) - 25 minutes  EstimatedBloodLoss:Minimal  Complications: None  Specimens:None  Implants: None  IndicationsforSurgery: This was a 52 year old male who was using a post figure when his hand got caught on a screw and with a laceration on the dorsal aspect of his hand overlying his right fourth MCP joint.  He had an avulsion from the proximal musculotendinous junction of his extensor digitorum tendon.  He presents emergency room where I was called for consultation.  I requested for exploration of his wound and felt that it was needed in the operating room.  I discussed risks and benefits with the patient including risk of bleeding, infection, tendon damage, need for further surgery, nerve and blood vessel injury, damage to the joint including arthritis, stiffness and possibility even loss of limb.  He agreed to proceed with surgery and consent was obtained.  Operative Findings: 1.  Partial traumatic avulsion right ring finger extensor digitorum tendon from the musculotendinous junction.  Greater than 60% of the tendon was still intact and functional 2.  Traumatic arthrotomy of  right fourth metacarpophalangeal joint with avulsion fracture off the metacarpal head treated with irrigation and debridement 3.  Simple closure of traumatic laceration to the dorsum of the hand approximately 3 cm in length  Procedure: The patient was identified in the preoperative holding area. Consent was confirmed with the patient and their family and all questions were answered. The operative extremity was marked after confirmation with the patient. The patient was then brought back to the operating room by our anesthesia colleagues.  He was carefully transferred over to regular OR table.  He was placed under general anesthetic.  A nonsterile tourniquet was placed to the upper extremity. The operative extremity was then prepped and draped in usual sterile fashion. A preoperative timeout was performed to verify the patient, the procedure, and the extremity. Preoperative antibiotics were dosed.  I elevated the arm and elevated the tourniquet to 250 mmHg.  Total tourniquet time as noted above.  I extended the traumatic laceration both proximally and distally to be able to access the wound and fully visualize it.  The traumatic skin was excised with a 15 blade. The tendon avulsion had come off the musculotendinous junction however was only a partial laceration of the extensor digitorum to the ring finger.  I then excised the avulsed fragment of the tendon.  Nearly 60% of the tendon was still intact and I felt that no further intervention was warranted in regards to the tendon.  I continued exploration and there was a traumatic rent in the capsule of the fourth MCP joint.  I extended this capsulotomy to fully access the joint.  Here I visualized an avulsion fracture off the metacarpal head and part of the articular cartilage of the dorsal ulnar surface.  At this point I then  proceeded to thoroughly irrigate the wound and the joint with approximately 3 L of normal saline.  I then proceeded to close the  traumatic arthrotomy with 2-0 Monocryl.  I tried to allow the tendon to go central in the over the MCP joint.  It appeared that the sagittal bands were still intact.  I then proceeded to do a layered closure of the wound with a 2-0 Monocryl and 3-0 nylon.  I placed vancomycin powder in the wound prior to closure.  I then placed a sterile dressing consisting of bacitracin ointment, Adaptic, 4 x 4's and sterile cast padding with an Ace wrap.  He was awoken from anesthesia and taken the PACU in stable condition.  Post Op Plan/Instructions: The patient will be nonweightbearing to the right upper extremity.  He will be discharged from the PACU.  He will return to see me in approximately 1 week for wound check and suture removal.  I will encourage range of motion of his fingers.  No DVT prophylaxis is needed in this ambulatory upper extremity surgery.  I was present and performed the entire surgery.  Katha Hamming, MD [facility]
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
## 2017-10-16 NOTE — Anesthesia Preprocedure Evaluation (Addendum)
Anesthesia Evaluation  Patient identified by MRN, date of birth, ID band Patient awake    Reviewed: Allergy & Precautions, NPO status , Patient's Chart, lab work & pertinent test results  History of Anesthesia Complications Negative for: history of anesthetic complications  Airway Mallampati: III  TM Distance: >3 FB Neck ROM: Full    Dental  (+) Teeth Intact, Dental Advisory Given   Pulmonary shortness of breath, sleep apnea , COPD,  COPD inhaler, former smoker,    breath sounds clear to auscultation       Cardiovascular hypertension, Pt. on medications (-) angina(-) Past MI and (-) CHF  Rhythm:Regular     Neuro/Psych  Headaches,  Neuromuscular disease    GI/Hepatic Neg liver ROS, GERD  ,  Endo/Other  negative endocrine ROS  Renal/GU negative Renal ROS     Musculoskeletal  (+) Arthritis ,   Abdominal   Peds  Hematology negative hematology ROS (+)   Anesthesia Other Findings Methadone for pain  Reproductive/Obstetrics                            Anesthesia Physical Anesthesia Plan  ASA: II  Anesthesia Plan: General   Post-op Pain Management:    Induction: Intravenous, Rapid sequence and Cricoid pressure planned  PONV Risk Score and Plan: 2 and Ondansetron and Dexamethasone  Airway Management Planned: Oral ETT  Additional Equipment: None  Intra-op Plan:   Post-operative Plan: Extubation in OR  Informed Consent: I have reviewed the patients History and Physical, chart, labs and discussed the procedure including the risks, benefits and alternatives for the proposed anesthesia with the patient or authorized representative who has indicated his/her understanding and acceptance.   Dental advisory given  Plan Discussed with: CRNA and Surgeon  Anesthesia Plan Comments:         Anesthesia Quick Evaluation

## 2017-10-16 NOTE — Transfer of Care (Signed)
Immediate Anesthesia Transfer of Care Note  Patient: [place]  Procedure(s) Performed: TENDON EXPLORATION RIGHT HAND (Right Hand)  Patient Location: PACU  Anesthesia Type:General  Level of Consciousness: awake, alert  and oriented  Airway & Oxygen Therapy: Patient Spontanous Breathing and Patient connected to nasal cannula oxygen  Post-op Assessment: Report given to RN, Post -op Vital signs reviewed and stable and Patient moving all extremities X 4  Post vital signs: Reviewed and stable  Last Vitals:  Vitals:   10/16/17 1310 10/16/17 1624  BP: 108/74 (!) 141/88  Pulse: 90 (!) 101  Resp: 16 16  Temp: 36.6 C (!) 36.4 C  SpO2: 93% 98%    Last Pain:  Vitals:   10/16/17 1624  TempSrc:   PainSc: (P) Asleep         Complications: No apparent anesthesia complications
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
## 2017-10-16 NOTE — ED Triage Notes (Addendum)
Patient has large, laceration to posterior of right hand. Per patient digging a hole for fence with pole digger and hand caught on screw. Per patient numbness in finger tips. Patient able to move fingers. Unsure of last tetanus vaccination. Bleeding controled. Hand dressed in triage with telfa, gauze, and kerlix.

## 2017-10-16 NOTE — ED Provider Notes (Signed)
Patient seen and evaluated with PA Georga Kaufmann.  Patient has laceration to right hand dorsal overlying MCP with exposed and extruded proximal aspect of extensor digiti.  I removed a small piece of cortical bone approximately 85mm from the wound.  The joint does not dislocate through the wound.  I am not visualizing open joint through the wound.  I am able to see cortical bone.  The radial/lateral band of extensor tendon tendon is intact.  The exposed tendon appears to be the ulnar aspect of extensor.  Care discussed with Dr. Doreatha Martin of hand surgery by PA Louie Bun.  Patient will be transferred to [facility] for definitive care.   Tanna Furry, MD 10/16/17 1326

## 2017-10-16 NOTE — Anesthesia Procedure Notes (Signed)
Procedure Name: Intubation Date/Time: 10/16/2017 3:37 PM Performed by: Suzy Bouchard, CRNA Pre-anesthesia Checklist: Patient identified, Suction available, Emergency Drugs available, Patient being monitored and Timeout performed Patient Re-evaluated:Patient Re-evaluated prior to induction Oxygen Delivery Method: Circle system utilized Preoxygenation: Pre-oxygenation with 100% oxygen Induction Type: IV induction and Rapid sequence Laryngoscope Size: Miller and 2 Grade View: Grade I Tube type: Oral Tube size: 7.5 mm Number of attempts: 1 Airway Equipment and Method: Stylet Placement Confirmation: ETT inserted through vocal cords under direct vision,  positive ETCO2 and breath sounds checked- equal and bilateral Secured at: 22 cm Tube secured with: Tape Dental Injury: Teeth and Oropharynx as per pre-operative assessment

## 2017-10-16 NOTE — ED Notes (Signed)
Called Short Stay times 2 with no answer.  Report given to [facility].

## 2017-10-16 NOTE — Discharge Instructions (Signed)
[facility] Discharge Instructions   General Discharge Instructions  WEIGHT BEARING STATUS: Nonweight bearing right hand  RANGE OF MOTION/ACTIVITY: You may move fingers and wrist as much as possible  Wound Care: See below  DVT/PE prophylaxis: None needed  Diet: as you were eating previously.  Can use over the counter stool softeners and bowel preparations, such as Miralax, to help with bowel movements.  Narcotics can be constipating.  Be sure to drink plenty of fluids  PAIN MEDICATION USE AND EXPECTATIONS  You have likely been given narcotic medications to help control your pain.  After a traumatic event that results in an fracture (broken bone) with or without surgery, it is ok to use narcotic pain medications to help control one's pain.  We understand that everyone responds to pain differently and each individual patient will be evaluated on a regular basis for the continued need for narcotic medications. Ideally, narcotic medication use should last no more than 6-8 weeks (coinciding with fracture healing).   As a patient it is your responsibility as well to monitor narcotic medication use and report the amount and frequency you use these medications when you come to your office visit.   We would also advise that if you are using narcotic medications, you should take a dose prior to therapy to maximize you participation.  IF YOU ARE ON NARCOTIC MEDICATIONS IT IS NOT PERMISSIBLE TO OPERATE A MOTOR VEHICLE (MOTORCYCLE/CAR/TRUCK/MOPED) OR HEAVY MACHINERY DO NOT MIX NARCOTICS WITH OTHER CNS (CENTRAL NERVOUS SYSTEM) DEPRESSANTS SUCH AS ALCOHOL   STOP SMOKING OR USING NICOTINE PRODUCTS!!!!  As discussed nicotine severely impairs your body's ability to heal surgical and traumatic wounds but also impairs bone healing.  Wounds and bone heal by forming microscopic blood vessels (angiogenesis) and nicotine is a vasoconstrictor (essentially, shrinks blood vessels).  Therefore, if  vasoconstriction occurs to these microscopic blood vessels they essentially disappear and are unable to deliver necessary nutrients to the healing tissue.  This is one modifiable factor that you can do to dramatically increase your chances of healing your injury.    (This means no smoking, no nicotine gum, patches, etc    ICE AND ELEVATE INJURED/OPERATIVE EXTREMITY  Using ice and elevating the injured extremity above your heart can help with swelling and pain control.  Icing in a pulsatile fashion, such as 20 minutes on and 20 minutes off, can be followed.    Do not place ice directly on skin. Make sure there is a barrier between to skin and the ice pack.    Using frozen items such as frozen peas works well as the conform nicely to the are that needs to be iced.  USE AN ACE WRAP OR TED HOSE FOR SWELLING CONTROL  In addition to icing and elevation, Ace wraps or TED hose are used to help limit and resolve swelling.  It is recommended to use Ace wraps or TED hose until you are informed to stop.    When using Ace Wraps start the wrapping distally (farthest away from the body) and wrap proximally (closer to the body)   Example: If you had surgery on your leg or thing and you do not have a splint on, start the ace wrap at the toes and work your way up to the thigh        If you had surgery on your upper extremity and do not have a splint on, start the ace wrap at your fingers and work your way up to the  upper arm  CALL THE OFFICE WITH ANY QUESTIONS OR CONCERNS: [phone]    Discharge Wound Care Instructions  You may remove the dressing on Thursday 10/20/17  Do NOT apply any ointments, solutions or lotions to pin sites or surgical wounds.  These prevent needed drainage and even though solutions like hydrogen peroxide kill bacteria, they also damage cells lining the pin sites that help fight infection.  Applying lotions or ointments can keep the wounds moist and can cause them to breakdown and open up  as well. This can increase the risk for infection. When in doubt call the office.  Surgical incisions should be dressed daily.  If any drainage is noted, use one layer of adaptic, then gauze, Kerlix, and an ace wrap.  Once the incision is completely dry and without drainage, it may be left open to air out.  Showering may begin 36-48 hours later.  Cleaning gently with soap and water.

## 2017-10-17 ENCOUNTER — Encounter (HOSPITAL_COMMUNITY): Payer: Self-pay | Admitting: Student

## 2017-10-17 DIAGNOSIS — G039 Meningitis, unspecified: Secondary | ICD-10-CM | POA: Diagnosis not present

## 2017-10-17 DIAGNOSIS — M5136 Other intervertebral disc degeneration, lumbar region: Secondary | ICD-10-CM | POA: Diagnosis not present

## 2017-10-17 DIAGNOSIS — Z79899 Other long term (current) drug therapy: Secondary | ICD-10-CM | POA: Diagnosis not present

## 2017-10-17 NOTE — Anesthesia Postprocedure Evaluation (Signed)
Anesthesia Post Note  Patient: [name] [name]  Procedure(s) Performed: TENDON EXPLORATION RIGHT HAND (Right Hand)     Patient location during evaluation: PACU Anesthesia Type: General Level of consciousness: awake and alert Pain management: pain level controlled Vital Signs Assessment: post-procedure vital signs reviewed and stable Respiratory status: spontaneous breathing, nonlabored ventilation, respiratory function stable and patient connected to nasal cannula oxygen Cardiovascular status: blood pressure returned to baseline and stable Postop Assessment: no apparent nausea or vomiting Anesthetic complications: no    Last Vitals:  Vitals:   10/16/17 1700 10/16/17 1715  BP:  (!) 118/92  Pulse: 89 88  Resp: 13 16  Temp:  (!) 36.4 C  SpO2: 96% 96%    Last Pain:  Vitals:   10/16/17 1715  TempSrc:   PainSc: 0-No pain                 Tonica Brasington

## 2017-10-19 DIAGNOSIS — F319 Bipolar disorder, unspecified: Secondary | ICD-10-CM | POA: Diagnosis not present

## 2017-10-26 DIAGNOSIS — S61411D Laceration without foreign body of right hand, subsequent encounter: Secondary | ICD-10-CM | POA: Diagnosis not present

## 2017-10-26 DIAGNOSIS — E785 Hyperlipidemia, unspecified: Secondary | ICD-10-CM | POA: Diagnosis not present

## 2017-10-26 DIAGNOSIS — I10 Essential (primary) hypertension: Secondary | ICD-10-CM | POA: Diagnosis not present

## 2017-11-17 DIAGNOSIS — M5136 Other intervertebral disc degeneration, lumbar region: Secondary | ICD-10-CM | POA: Diagnosis not present

## 2017-11-17 DIAGNOSIS — Z79899 Other long term (current) drug therapy: Secondary | ICD-10-CM | POA: Diagnosis not present

## 2017-12-13 DIAGNOSIS — Z79899 Other long term (current) drug therapy: Secondary | ICD-10-CM | POA: Diagnosis not present

## 2017-12-13 DIAGNOSIS — M5136 Other intervertebral disc degeneration, lumbar region: Secondary | ICD-10-CM | POA: Diagnosis not present

## 2017-12-14 DIAGNOSIS — I10 Essential (primary) hypertension: Secondary | ICD-10-CM | POA: Diagnosis not present

## 2017-12-14 DIAGNOSIS — J441 Chronic obstructive pulmonary disease with (acute) exacerbation: Secondary | ICD-10-CM | POA: Diagnosis not present

## 2017-12-14 DIAGNOSIS — G4733 Obstructive sleep apnea (adult) (pediatric): Secondary | ICD-10-CM | POA: Diagnosis not present

## 2017-12-23 DIAGNOSIS — G4730 Sleep apnea, unspecified: Secondary | ICD-10-CM | POA: Diagnosis not present

## 2017-12-23 DIAGNOSIS — E785 Hyperlipidemia, unspecified: Secondary | ICD-10-CM | POA: Diagnosis not present

## 2017-12-23 DIAGNOSIS — G4700 Insomnia, unspecified: Secondary | ICD-10-CM | POA: Diagnosis not present

## 2017-12-23 DIAGNOSIS — R0600 Dyspnea, unspecified: Secondary | ICD-10-CM | POA: Diagnosis not present

## 2017-12-23 DIAGNOSIS — K210 Gastro-esophageal reflux disease with esophagitis: Secondary | ICD-10-CM | POA: Diagnosis not present

## 2017-12-23 DIAGNOSIS — R5383 Other fatigue: Secondary | ICD-10-CM | POA: Diagnosis not present

## 2017-12-23 DIAGNOSIS — I10 Essential (primary) hypertension: Secondary | ICD-10-CM | POA: Diagnosis not present

## 2017-12-23 DIAGNOSIS — J449 Chronic obstructive pulmonary disease, unspecified: Secondary | ICD-10-CM | POA: Diagnosis not present

## 2017-12-26 ENCOUNTER — Other Ambulatory Visit: Payer: Self-pay

## 2017-12-26 ENCOUNTER — Emergency Department (HOSPITAL_COMMUNITY): Payer: Medicare HMO

## 2017-12-26 ENCOUNTER — Encounter (HOSPITAL_COMMUNITY): Payer: Self-pay | Admitting: Emergency Medicine

## 2017-12-26 ENCOUNTER — Emergency Department (HOSPITAL_COMMUNITY)
Admission: EM | Admit: 2017-12-26 | Discharge: 2017-12-26 | Disposition: A | Discharge status: Home / Self Care | Payer: Medicare HMO | Attending: Emergency Medicine | Admitting: Emergency Medicine

## 2017-12-26 DIAGNOSIS — R202 Paresthesia of skin: Secondary | ICD-10-CM | POA: Diagnosis not present

## 2017-12-26 DIAGNOSIS — Z87891 Personal history of nicotine dependence: Secondary | ICD-10-CM | POA: Insufficient documentation

## 2017-12-26 DIAGNOSIS — J449 Chronic obstructive pulmonary disease, unspecified: Secondary | ICD-10-CM | POA: Insufficient documentation

## 2017-12-26 DIAGNOSIS — I10 Essential (primary) hypertension: Secondary | ICD-10-CM | POA: Diagnosis not present

## 2017-12-26 DIAGNOSIS — Z79899 Other long term (current) drug therapy: Secondary | ICD-10-CM | POA: Insufficient documentation

## 2017-12-26 DIAGNOSIS — R079 Chest pain, unspecified: Secondary | ICD-10-CM

## 2017-12-26 DIAGNOSIS — R0789 Other chest pain: Secondary | ICD-10-CM | POA: Diagnosis not present

## 2017-12-26 LAB — CBC
HCT: 45.2 % (ref 39.0–52.0)
Hemoglobin: 15.6 g/dL (ref 13.0–17.0)
MCH: 30.6 pg (ref 26.0–34.0)
MCHC: 34.5 g/dL (ref 30.0–36.0)
MCV: 88.6 fL (ref 78.0–100.0)
Platelets: 207 10*3/uL (ref 150–400)
RBC: 5.1 MIL/uL (ref 4.22–5.81)
RDW: 12.4 % (ref 11.5–15.5)
WBC: 11.5 10*3/uL — AB (ref 4.0–10.5)

## 2017-12-26 LAB — BASIC METABOLIC PANEL
Anion gap: 10 (ref 5–15)
BUN: 19 mg/dL (ref 6–20)
CO2: 30 mmol/L (ref 22–32)
Calcium: 8.9 mg/dL (ref 8.9–10.3)
Chloride: 99 mmol/L — ABNORMAL LOW (ref 101–111)
Creatinine, Ser: 1.01 mg/dL (ref 0.61–1.24)
GFR calc Af Amer: >60 mL/min (ref >60)
GLUCOSE: 102 mg/dL — AB (ref 65–99)
Potassium: 3.6 mmol/L (ref 3.5–5.1)
SODIUM: 139 mmol/L (ref 135–145)

## 2017-12-26 LAB — TROPONIN I
Troponin I: <0.03 ng/mL (ref <0.03)
Troponin I: <0.03 ng/mL (ref <0.03)

## 2017-12-26 LAB — D-DIMER, QUANTITATIVE (NOT AT ARMC): D DIMER QUANT: 0.89 ug{FEU}/mL — AB (ref 0.00–0.50)

## 2017-12-26 LAB — BRAIN NATRIURETIC PEPTIDE: B Natriuretic Peptide: 18 pg/mL (ref 0.0–100.0)

## 2017-12-26 MED ORDER — IBUPROFEN 600 MG PO TABS: 600.0000 mg | ORAL_TABLET | Freq: Three times a day (TID) | ORAL | 0 refills | Status: DC

## 2017-12-26 MED ORDER — IOPAMIDOL (ISOVUE-370) INJECTION 76%
100.0000 mL | Freq: Once | INTRAVENOUS | Status: AC | PRN
  Administered 2017-12-26: 100 mL via INTRAVENOUS

## 2017-12-26 MED ORDER — METHOCARBAMOL 500 MG PO TABS: 1000.0000 mg | ORAL_TABLET | Freq: Three times a day (TID) | ORAL | 0 refills | Status: DC | PRN

## 2017-12-26 MED ORDER — METHOCARBAMOL 500 MG PO TABS: 1000.0000 mg | ORAL_TABLET | Freq: Three times a day (TID) | ORAL | Status: DC | PRN

## 2017-12-26 NOTE — ED Triage Notes (Signed)
Pt c/o of left sided chest pain with radiation between shoulder starting last night.  Pt also states having left arm numbness starting at 2100 last night.

## 2017-12-26 NOTE — ED Notes (Signed)
Returned from CT.
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
## 2017-12-26 NOTE — ED Notes (Signed)
Pt refused wrist splint.

## 2017-12-30 DIAGNOSIS — G4733 Obstructive sleep apnea (adult) (pediatric): Secondary | ICD-10-CM | POA: Diagnosis not present

## 2017-12-30 DIAGNOSIS — J449 Chronic obstructive pulmonary disease, unspecified: Secondary | ICD-10-CM | POA: Diagnosis not present

## 2017-12-30 DIAGNOSIS — I10 Essential (primary) hypertension: Secondary | ICD-10-CM | POA: Diagnosis not present

## 2018-01-13 DIAGNOSIS — Z79899 Other long term (current) drug therapy: Secondary | ICD-10-CM | POA: Diagnosis not present

## 2018-01-13 DIAGNOSIS — M5136 Other intervertebral disc degeneration, lumbar region: Secondary | ICD-10-CM | POA: Diagnosis not present

## 2018-01-24 DIAGNOSIS — F331 Major depressive disorder, recurrent, moderate: Secondary | ICD-10-CM | POA: Diagnosis not present

## 2018-01-24 DIAGNOSIS — E785 Hyperlipidemia, unspecified: Secondary | ICD-10-CM | POA: Diagnosis not present

## 2018-01-24 DIAGNOSIS — J410 Simple chronic bronchitis: Secondary | ICD-10-CM | POA: Diagnosis not present

## 2018-01-24 DIAGNOSIS — R739 Hyperglycemia, unspecified: Secondary | ICD-10-CM | POA: Diagnosis not present

## 2018-01-24 DIAGNOSIS — J42 Unspecified chronic bronchitis: Secondary | ICD-10-CM | POA: Diagnosis not present

## 2018-01-24 DIAGNOSIS — Z1331 Encounter for screening for depression: Secondary | ICD-10-CM | POA: Diagnosis not present

## 2018-01-24 DIAGNOSIS — Z0000 Encounter for general adult medical examination without abnormal findings: Secondary | ICD-10-CM | POA: Diagnosis not present

## 2018-01-24 DIAGNOSIS — Z1389 Encounter for screening for other disorder: Secondary | ICD-10-CM | POA: Diagnosis not present

## 2018-01-24 DIAGNOSIS — E669 Obesity, unspecified: Secondary | ICD-10-CM | POA: Diagnosis not present

## 2018-01-24 DIAGNOSIS — I10 Essential (primary) hypertension: Secondary | ICD-10-CM | POA: Diagnosis not present

## 2018-01-24 DIAGNOSIS — K219 Gastro-esophageal reflux disease without esophagitis: Secondary | ICD-10-CM | POA: Diagnosis not present

## 2018-01-24 DIAGNOSIS — M549 Dorsalgia, unspecified: Secondary | ICD-10-CM | POA: Diagnosis not present

## 2018-02-08 DIAGNOSIS — F319 Bipolar disorder, unspecified: Secondary | ICD-10-CM | POA: Diagnosis not present

## 2018-02-10 DIAGNOSIS — I10 Essential (primary) hypertension: Secondary | ICD-10-CM | POA: Diagnosis not present

## 2018-02-10 DIAGNOSIS — K210 Gastro-esophageal reflux disease with esophagitis: Secondary | ICD-10-CM | POA: Diagnosis not present

## 2018-02-10 DIAGNOSIS — J449 Chronic obstructive pulmonary disease, unspecified: Secondary | ICD-10-CM | POA: Diagnosis not present

## 2018-02-10 DIAGNOSIS — G4733 Obstructive sleep apnea (adult) (pediatric): Secondary | ICD-10-CM | POA: Diagnosis not present

## 2018-02-13 DIAGNOSIS — M5136 Other intervertebral disc degeneration, lumbar region: Secondary | ICD-10-CM | POA: Diagnosis not present

## 2018-02-13 DIAGNOSIS — G894 Chronic pain syndrome: Secondary | ICD-10-CM | POA: Diagnosis not present

## 2018-02-13 DIAGNOSIS — Z79899 Other long term (current) drug therapy: Secondary | ICD-10-CM | POA: Diagnosis not present

## 2018-02-17 ENCOUNTER — Other Ambulatory Visit: Payer: Self-pay | Admitting: Gastroenterology

## 2018-03-13 DIAGNOSIS — I10 Essential (primary) hypertension: Secondary | ICD-10-CM | POA: Diagnosis not present

## 2018-03-13 DIAGNOSIS — J410 Simple chronic bronchitis: Secondary | ICD-10-CM | POA: Diagnosis not present

## 2018-03-13 DIAGNOSIS — B369 Superficial mycosis, unspecified: Secondary | ICD-10-CM | POA: Diagnosis not present

## 2018-03-17 DIAGNOSIS — G894 Chronic pain syndrome: Secondary | ICD-10-CM | POA: Diagnosis not present

## 2018-03-17 DIAGNOSIS — M5136 Other intervertebral disc degeneration, lumbar region: Secondary | ICD-10-CM | POA: Diagnosis not present

## 2018-03-17 DIAGNOSIS — Z79899 Other long term (current) drug therapy: Secondary | ICD-10-CM | POA: Diagnosis not present

## 2018-04-19 DIAGNOSIS — M5136 Other intervertebral disc degeneration, lumbar region: Secondary | ICD-10-CM | POA: Diagnosis not present

## 2018-04-19 DIAGNOSIS — Z79899 Other long term (current) drug therapy: Secondary | ICD-10-CM | POA: Diagnosis not present

## 2018-04-19 DIAGNOSIS — G894 Chronic pain syndrome: Secondary | ICD-10-CM | POA: Diagnosis not present

## 2018-05-01 DIAGNOSIS — L304 Erythema intertrigo: Secondary | ICD-10-CM | POA: Diagnosis not present

## 2018-05-11 DIAGNOSIS — M549 Dorsalgia, unspecified: Secondary | ICD-10-CM | POA: Diagnosis not present

## 2018-05-11 DIAGNOSIS — K219 Gastro-esophageal reflux disease without esophagitis: Secondary | ICD-10-CM | POA: Diagnosis not present

## 2018-05-11 DIAGNOSIS — J029 Acute pharyngitis, unspecified: Secondary | ICD-10-CM | POA: Diagnosis not present

## 2018-05-11 DIAGNOSIS — I10 Essential (primary) hypertension: Secondary | ICD-10-CM | POA: Diagnosis not present

## 2018-05-18 DIAGNOSIS — M5136 Other intervertebral disc degeneration, lumbar region: Secondary | ICD-10-CM | POA: Diagnosis not present

## 2018-05-18 DIAGNOSIS — G894 Chronic pain syndrome: Secondary | ICD-10-CM | POA: Diagnosis not present

## 2018-05-18 DIAGNOSIS — Z79899 Other long term (current) drug therapy: Secondary | ICD-10-CM | POA: Diagnosis not present

## 2018-05-19 DIAGNOSIS — G4733 Obstructive sleep apnea (adult) (pediatric): Secondary | ICD-10-CM | POA: Diagnosis not present

## 2018-05-19 DIAGNOSIS — J449 Chronic obstructive pulmonary disease, unspecified: Secondary | ICD-10-CM | POA: Diagnosis not present

## 2018-05-19 DIAGNOSIS — I10 Essential (primary) hypertension: Secondary | ICD-10-CM | POA: Diagnosis not present

## 2018-05-24 DIAGNOSIS — F319 Bipolar disorder, unspecified: Secondary | ICD-10-CM | POA: Diagnosis not present

## 2018-06-15 DIAGNOSIS — Z79899 Other long term (current) drug therapy: Secondary | ICD-10-CM | POA: Diagnosis not present

## 2018-06-15 DIAGNOSIS — G894 Chronic pain syndrome: Secondary | ICD-10-CM | POA: Diagnosis not present

## 2018-06-15 DIAGNOSIS — M5136 Other intervertebral disc degeneration, lumbar region: Secondary | ICD-10-CM | POA: Diagnosis not present

## 2018-07-04 ENCOUNTER — Emergency Department (HOSPITAL_COMMUNITY)
Admission: EM | Admit: 2018-07-04 | Discharge: 2018-07-04 | Disposition: A | Discharge status: Home / Self Care | Payer: Medicare HMO | Attending: Emergency Medicine | Admitting: Emergency Medicine

## 2018-07-04 ENCOUNTER — Emergency Department (HOSPITAL_COMMUNITY): Payer: Medicare HMO

## 2018-07-04 ENCOUNTER — Other Ambulatory Visit: Payer: Self-pay

## 2018-07-04 ENCOUNTER — Encounter (HOSPITAL_COMMUNITY): Payer: Self-pay | Admitting: Emergency Medicine

## 2018-07-04 DIAGNOSIS — R110 Nausea: Secondary | ICD-10-CM | POA: Diagnosis not present

## 2018-07-04 DIAGNOSIS — G894 Chronic pain syndrome: Secondary | ICD-10-CM | POA: Diagnosis not present

## 2018-07-04 DIAGNOSIS — R0789 Other chest pain: Secondary | ICD-10-CM

## 2018-07-04 DIAGNOSIS — I10 Essential (primary) hypertension: Secondary | ICD-10-CM | POA: Insufficient documentation

## 2018-07-04 DIAGNOSIS — R0602 Shortness of breath: Secondary | ICD-10-CM | POA: Diagnosis not present

## 2018-07-04 DIAGNOSIS — J449 Chronic obstructive pulmonary disease, unspecified: Secondary | ICD-10-CM | POA: Insufficient documentation

## 2018-07-04 DIAGNOSIS — M5136 Other intervertebral disc degeneration, lumbar region: Secondary | ICD-10-CM | POA: Diagnosis not present

## 2018-07-04 DIAGNOSIS — R05 Cough: Secondary | ICD-10-CM | POA: Diagnosis not present

## 2018-07-04 DIAGNOSIS — Z79899 Other long term (current) drug therapy: Secondary | ICD-10-CM | POA: Insufficient documentation

## 2018-07-04 DIAGNOSIS — R079 Chest pain, unspecified: Secondary | ICD-10-CM | POA: Diagnosis not present

## 2018-07-04 DIAGNOSIS — Z87891 Personal history of nicotine dependence: Secondary | ICD-10-CM | POA: Diagnosis not present

## 2018-07-04 LAB — BASIC METABOLIC PANEL
ANION GAP: 8 (ref 5–15)
BUN: 14 mg/dL (ref 6–20)
CALCIUM: 9.2 mg/dL (ref 8.9–10.3)
CO2: 23 mmol/L (ref 22–32)
Chloride: 108 mmol/L (ref 98–111)
Creatinine, Ser: 0.97 mg/dL (ref 0.61–1.24)
GFR calc Af Amer: >60 mL/min (ref >60)
Glucose, Bld: 95 mg/dL (ref 70–99)
Potassium: 3.8 mmol/L (ref 3.5–5.1)
SODIUM: 139 mmol/L (ref 135–145)

## 2018-07-04 LAB — CBC
HCT: 44.2 % (ref 39.0–52.0)
Hemoglobin: 15.3 g/dL (ref 13.0–17.0)
MCH: 29.1 pg (ref 26.0–34.0)
MCHC: 34.6 g/dL (ref 30.0–36.0)
MCV: 84 fL (ref 80.0–100.0)
PLATELETS: 297 10*3/uL (ref 150–400)
RBC: 5.26 MIL/uL (ref 4.22–5.81)
RDW: 11.9 % (ref 11.5–15.5)
WBC: 9.6 10*3/uL (ref 4.0–10.5)
nRBC: 0 % (ref 0.0–0.2)

## 2018-07-04 LAB — TROPONIN I

## 2018-07-04 MED ORDER — LORAZEPAM 0.5 MG PO TABS: 0.5000 mg | ORAL_TABLET | Freq: Three times a day (TID) | ORAL | 0 refills | Status: DC | PRN

## 2018-07-04 MED ORDER — LORAZEPAM 2 MG/ML IJ SOLN
1.0000 mg | Freq: Once | INTRAMUSCULAR | Status: DC
  Filled 2018-07-04: qty 1
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
## 2018-07-04 NOTE — Discharge Instructions (Signed)
As discussed, your evaluation today has been largely reassuring.  But, it is important that you monitor your condition carefully, and do not hesitate to return to the ED if you develop new, or concerning changes in your condition. ? ?Otherwise, please follow-up with your physician for appropriate ongoing care. ? ?

## 2018-07-04 NOTE — ED Triage Notes (Signed)
PT c/o right sided chest pain and left arm pain x3 days. PT states he has been having episodes of diaphoresis and some SOB

## 2018-07-05 DIAGNOSIS — H52229 Regular astigmatism, unspecified eye: Secondary | ICD-10-CM | POA: Diagnosis not present

## 2018-07-20 ENCOUNTER — Encounter: Payer: Self-pay | Admitting: Internal Medicine

## 2018-07-24 ENCOUNTER — Other Ambulatory Visit: Payer: Self-pay | Admitting: Gastroenterology

## 2018-08-04 DIAGNOSIS — G8929 Other chronic pain: Secondary | ICD-10-CM | POA: Diagnosis not present

## 2018-08-04 DIAGNOSIS — M545 Low back pain: Secondary | ICD-10-CM | POA: Diagnosis not present

## 2018-08-04 DIAGNOSIS — Z79899 Other long term (current) drug therapy: Secondary | ICD-10-CM | POA: Diagnosis not present

## 2018-08-04 DIAGNOSIS — F1120 Opioid dependence, uncomplicated: Secondary | ICD-10-CM | POA: Diagnosis not present

## 2018-08-04 DIAGNOSIS — M5136 Other intervertebral disc degeneration, lumbar region: Secondary | ICD-10-CM | POA: Diagnosis not present

## 2018-08-08 DIAGNOSIS — M545 Low back pain: Secondary | ICD-10-CM | POA: Diagnosis not present

## 2018-08-08 DIAGNOSIS — M79604 Pain in right leg: Secondary | ICD-10-CM | POA: Diagnosis not present

## 2018-08-08 DIAGNOSIS — M79605 Pain in left leg: Secondary | ICD-10-CM | POA: Diagnosis not present

## 2018-08-08 DIAGNOSIS — G894 Chronic pain syndrome: Secondary | ICD-10-CM | POA: Diagnosis not present

## 2018-08-10 DIAGNOSIS — I10 Essential (primary) hypertension: Secondary | ICD-10-CM | POA: Diagnosis not present

## 2018-08-10 DIAGNOSIS — R739 Hyperglycemia, unspecified: Secondary | ICD-10-CM | POA: Diagnosis not present

## 2018-08-10 DIAGNOSIS — E785 Hyperlipidemia, unspecified: Secondary | ICD-10-CM | POA: Diagnosis not present

## 2018-08-10 DIAGNOSIS — Z0000 Encounter for general adult medical examination without abnormal findings: Secondary | ICD-10-CM | POA: Diagnosis not present

## 2018-08-10 DIAGNOSIS — E669 Obesity, unspecified: Secondary | ICD-10-CM | POA: Diagnosis not present

## 2018-08-10 DIAGNOSIS — J410 Simple chronic bronchitis: Secondary | ICD-10-CM | POA: Diagnosis not present

## 2018-08-10 DIAGNOSIS — R000 Tachycardia, unspecified: Secondary | ICD-10-CM | POA: Diagnosis not present

## 2018-08-10 DIAGNOSIS — F331 Major depressive disorder, recurrent, moderate: Secondary | ICD-10-CM | POA: Diagnosis not present

## 2018-08-10 DIAGNOSIS — R251 Tremor, unspecified: Secondary | ICD-10-CM | POA: Diagnosis not present

## 2018-08-10 DIAGNOSIS — J42 Unspecified chronic bronchitis: Secondary | ICD-10-CM | POA: Diagnosis not present

## 2018-08-10 DIAGNOSIS — M549 Dorsalgia, unspecified: Secondary | ICD-10-CM | POA: Diagnosis not present

## 2018-08-18 DIAGNOSIS — I10 Essential (primary) hypertension: Secondary | ICD-10-CM | POA: Diagnosis not present

## 2018-08-18 DIAGNOSIS — K210 Gastro-esophageal reflux disease with esophagitis: Secondary | ICD-10-CM | POA: Diagnosis not present

## 2018-08-18 DIAGNOSIS — G4733 Obstructive sleep apnea (adult) (pediatric): Secondary | ICD-10-CM | POA: Diagnosis not present

## 2018-08-18 DIAGNOSIS — J449 Chronic obstructive pulmonary disease, unspecified: Secondary | ICD-10-CM | POA: Diagnosis not present

## 2018-09-04 DIAGNOSIS — Z79899 Other long term (current) drug therapy: Secondary | ICD-10-CM | POA: Diagnosis not present

## 2018-09-04 DIAGNOSIS — F1120 Opioid dependence, uncomplicated: Secondary | ICD-10-CM | POA: Diagnosis not present

## 2018-09-04 DIAGNOSIS — M5136 Other intervertebral disc degeneration, lumbar region: Secondary | ICD-10-CM | POA: Diagnosis not present

## 2018-09-04 DIAGNOSIS — G894 Chronic pain syndrome: Secondary | ICD-10-CM | POA: Diagnosis not present

## 2018-09-05 DIAGNOSIS — H18413 Arcus senilis, bilateral: Secondary | ICD-10-CM | POA: Diagnosis not present

## 2018-09-05 DIAGNOSIS — H2513 Age-related nuclear cataract, bilateral: Secondary | ICD-10-CM | POA: Diagnosis not present

## 2018-09-05 DIAGNOSIS — H2511 Age-related nuclear cataract, right eye: Secondary | ICD-10-CM | POA: Diagnosis not present

## 2018-09-05 DIAGNOSIS — H02831 Dermatochalasis of right upper eyelid: Secondary | ICD-10-CM | POA: Diagnosis not present

## 2018-09-05 DIAGNOSIS — H25043 Posterior subcapsular polar age-related cataract, bilateral: Secondary | ICD-10-CM | POA: Diagnosis not present

## 2018-09-05 DIAGNOSIS — H25013 Cortical age-related cataract, bilateral: Secondary | ICD-10-CM | POA: Diagnosis not present

## 2018-09-06 DIAGNOSIS — F319 Bipolar disorder, unspecified: Secondary | ICD-10-CM | POA: Diagnosis not present

## 2018-09-08 DIAGNOSIS — H2512 Age-related nuclear cataract, left eye: Secondary | ICD-10-CM | POA: Diagnosis not present

## 2018-09-08 DIAGNOSIS — H2511 Age-related nuclear cataract, right eye: Secondary | ICD-10-CM | POA: Diagnosis not present

## 2018-10-02 DIAGNOSIS — M5136 Other intervertebral disc degeneration, lumbar region: Secondary | ICD-10-CM | POA: Diagnosis not present

## 2018-10-02 DIAGNOSIS — Z79899 Other long term (current) drug therapy: Secondary | ICD-10-CM | POA: Diagnosis not present

## 2018-10-02 DIAGNOSIS — G894 Chronic pain syndrome: Secondary | ICD-10-CM | POA: Diagnosis not present

## 2018-10-03 ENCOUNTER — Encounter (HOSPITAL_COMMUNITY): Payer: Self-pay | Admitting: *Deleted

## 2018-10-03 ENCOUNTER — Emergency Department (HOSPITAL_COMMUNITY): Payer: Medicare HMO

## 2018-10-03 ENCOUNTER — Other Ambulatory Visit: Payer: Self-pay

## 2018-10-03 ENCOUNTER — Emergency Department (HOSPITAL_COMMUNITY)
Admission: EM | Admit: 2018-10-03 | Discharge: 2018-10-03 | Disposition: A | Discharge status: Home / Self Care | Payer: Medicare HMO | Attending: Emergency Medicine | Admitting: Emergency Medicine

## 2018-10-03 DIAGNOSIS — F1120 Opioid dependence, uncomplicated: Secondary | ICD-10-CM | POA: Insufficient documentation

## 2018-10-03 DIAGNOSIS — Z87891 Personal history of nicotine dependence: Secondary | ICD-10-CM | POA: Insufficient documentation

## 2018-10-03 DIAGNOSIS — R202 Paresthesia of skin: Secondary | ICD-10-CM | POA: Insufficient documentation

## 2018-10-03 DIAGNOSIS — F1421 Cocaine dependence, in remission: Secondary | ICD-10-CM | POA: Insufficient documentation

## 2018-10-03 DIAGNOSIS — M4802 Spinal stenosis, cervical region: Secondary | ICD-10-CM | POA: Diagnosis not present

## 2018-10-03 DIAGNOSIS — M25512 Pain in left shoulder: Secondary | ICD-10-CM | POA: Diagnosis not present

## 2018-10-03 DIAGNOSIS — I10 Essential (primary) hypertension: Secondary | ICD-10-CM | POA: Insufficient documentation

## 2018-10-03 DIAGNOSIS — J449 Chronic obstructive pulmonary disease, unspecified: Secondary | ICD-10-CM | POA: Diagnosis not present

## 2018-10-03 DIAGNOSIS — M50323 Other cervical disc degeneration at C6-C7 level: Secondary | ICD-10-CM | POA: Diagnosis not present

## 2018-10-03 DIAGNOSIS — R200 Anesthesia of skin: Secondary | ICD-10-CM | POA: Diagnosis not present

## 2018-10-03 DIAGNOSIS — F319 Bipolar disorder, unspecified: Secondary | ICD-10-CM | POA: Diagnosis not present

## 2018-10-03 DIAGNOSIS — Z79899 Other long term (current) drug therapy: Secondary | ICD-10-CM | POA: Diagnosis not present

## 2018-10-03 DIAGNOSIS — R0789 Other chest pain: Secondary | ICD-10-CM | POA: Diagnosis not present

## 2018-10-03 DIAGNOSIS — R079 Chest pain, unspecified: Secondary | ICD-10-CM | POA: Diagnosis not present

## 2018-10-03 HISTORY — DX: Unspecified rotator cuff tear or rupture of left shoulder, not specified as traumatic: M75.102

## 2018-10-03 HISTORY — DX: Pain, unspecified: R52

## 2018-10-03 LAB — CBC WITH DIFFERENTIAL/PLATELET
Abs Immature Granulocytes: 0.03 10*3/uL (ref 0.00–0.07)
BASOS ABS: 0.1 10*3/uL (ref 0.0–0.1)
Basophils Relative: 1 %
EOS ABS: 0.1 10*3/uL (ref 0.0–0.5)
Eosinophils Relative: 1 %
HCT: 44.2 % (ref 39.0–52.0)
Hemoglobin: 15.4 g/dL (ref 13.0–17.0)
Immature Granulocytes: 0 %
LYMPHS PCT: 26 %
Lymphs Abs: 2.3 10*3/uL (ref 0.7–4.0)
MCH: 29.9 pg (ref 26.0–34.0)
MCHC: 34.8 g/dL (ref 30.0–36.0)
MCV: 85.8 fL (ref 80.0–100.0)
MONO ABS: 0.8 10*3/uL (ref 0.1–1.0)
Monocytes Relative: 9 %
NRBC: 0 % (ref 0.0–0.2)
Neutro Abs: 5.5 10*3/uL (ref 1.7–7.7)
Neutrophils Relative %: 63 %
Platelets: 242 10*3/uL (ref 150–400)
RBC: 5.15 MIL/uL (ref 4.22–5.81)
RDW: 11.6 % (ref 11.5–15.5)
WBC: 8.8 10*3/uL (ref 4.0–10.5)

## 2018-10-03 LAB — BASIC METABOLIC PANEL
ANION GAP: 9 (ref 5–15)
BUN: 14 mg/dL (ref 6–20)
CO2: 25 mmol/L (ref 22–32)
CREATININE: 0.84 mg/dL (ref 0.61–1.24)
Calcium: 9.3 mg/dL (ref 8.9–10.3)
Chloride: 105 mmol/L (ref 98–111)
GFR calc Af Amer: >60 mL/min (ref >60)
Glucose, Bld: 98 mg/dL (ref 70–99)
Potassium: 3.7 mmol/L (ref 3.5–5.1)
SODIUM: 139 mmol/L (ref 135–145)

## 2018-10-03 LAB — TROPONIN I

## 2018-10-03 MED ORDER — OXYCODONE-ACETAMINOPHEN 5-325 MG PO TABS
1.0000 | ORAL_TABLET | Freq: Once | ORAL | Status: AC
  Administered 2018-10-03: 1 via ORAL
  Filled 2018-10-03: qty 1

## 2018-10-03 MED ORDER — METHOCARBAMOL 500 MG PO TABS: 1000.0000 mg | ORAL_TABLET | Freq: Four times a day (QID) | ORAL | 0 refills | Status: DC | PRN

## 2018-10-03 NOTE — Discharge Instructions (Signed)
Take the prescription as directed.  Apply moist heat or ice to the area(s) of discomfort, for 15 minutes at a time, several times per day for the next few days.  Do not fall asleep on a heating or ice pack.  Call your regular medical doctor and your Orthopedic doctor today to schedule a follow up appointment within the next week.  Return to the Emergency Department immediately if worsening.

## 2018-10-03 NOTE — ED Triage Notes (Signed)
Pt c/o left sided chest pain that radiates to left arm x 1 week. Pt reports his left arm also feels weak and numb. Describes the pain as dull. Denies SOB, n/v, dizziness.
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
## 2018-10-06 DIAGNOSIS — H2512 Age-related nuclear cataract, left eye: Secondary | ICD-10-CM | POA: Diagnosis not present

## 2018-10-18 DIAGNOSIS — I10 Essential (primary) hypertension: Secondary | ICD-10-CM | POA: Diagnosis not present

## 2018-10-18 DIAGNOSIS — R5383 Other fatigue: Secondary | ICD-10-CM | POA: Diagnosis not present

## 2018-10-18 DIAGNOSIS — G4733 Obstructive sleep apnea (adult) (pediatric): Secondary | ICD-10-CM | POA: Diagnosis not present

## 2018-10-18 DIAGNOSIS — J449 Chronic obstructive pulmonary disease, unspecified: Secondary | ICD-10-CM | POA: Diagnosis not present

## 2018-10-18 DIAGNOSIS — K210 Gastro-esophageal reflux disease with esophagitis: Secondary | ICD-10-CM | POA: Diagnosis not present

## 2018-10-26 ENCOUNTER — Other Ambulatory Visit: Payer: Self-pay | Admitting: Gastroenterology

## 2018-11-02 DIAGNOSIS — G894 Chronic pain syndrome: Secondary | ICD-10-CM | POA: Diagnosis not present

## 2018-11-02 DIAGNOSIS — M5136 Other intervertebral disc degeneration, lumbar region: Secondary | ICD-10-CM | POA: Diagnosis not present

## 2018-11-02 DIAGNOSIS — Z9189 Other specified personal risk factors, not elsewhere classified: Secondary | ICD-10-CM | POA: Diagnosis not present

## 2018-11-02 DIAGNOSIS — Z79899 Other long term (current) drug therapy: Secondary | ICD-10-CM | POA: Diagnosis not present

## 2018-11-08 DIAGNOSIS — J410 Simple chronic bronchitis: Secondary | ICD-10-CM | POA: Diagnosis not present

## 2018-11-08 DIAGNOSIS — I10 Essential (primary) hypertension: Secondary | ICD-10-CM | POA: Diagnosis not present

## 2018-11-08 DIAGNOSIS — K219 Gastro-esophageal reflux disease without esophagitis: Secondary | ICD-10-CM | POA: Diagnosis not present

## 2018-11-13 DIAGNOSIS — F319 Bipolar disorder, unspecified: Secondary | ICD-10-CM | POA: Diagnosis not present

## 2019-01-03 ENCOUNTER — Other Ambulatory Visit: Payer: Self-pay | Admitting: Nurse Practitioner

## 2019-01-03 DIAGNOSIS — M5136 Other intervertebral disc degeneration, lumbar region: Secondary | ICD-10-CM

## 2019-01-17 ENCOUNTER — Ambulatory Visit (HOSPITAL_COMMUNITY)
Admission: RE | Admit: 2019-01-17 | Discharge: 2019-01-17 | Disposition: A | Discharge status: Home / Self Care | Payer: Medicare HMO | Source: Ambulatory Visit | Attending: Nurse Practitioner | Admitting: Nurse Practitioner

## 2019-01-17 ENCOUNTER — Other Ambulatory Visit: Payer: Self-pay

## 2019-01-17 DIAGNOSIS — M5136 Other intervertebral disc degeneration, lumbar region: Secondary | ICD-10-CM | POA: Insufficient documentation

## 2019-02-14 IMAGING — DX DG CHEST 2V
2 series · 2 of 2 positions shown · non-contrast
Comparison: 12/26/2017

CLINICAL DATA: Chest pain, cough, and shortness of breath for
several days.

EXAM:
CHEST - 2 VIEW

[chest pa]
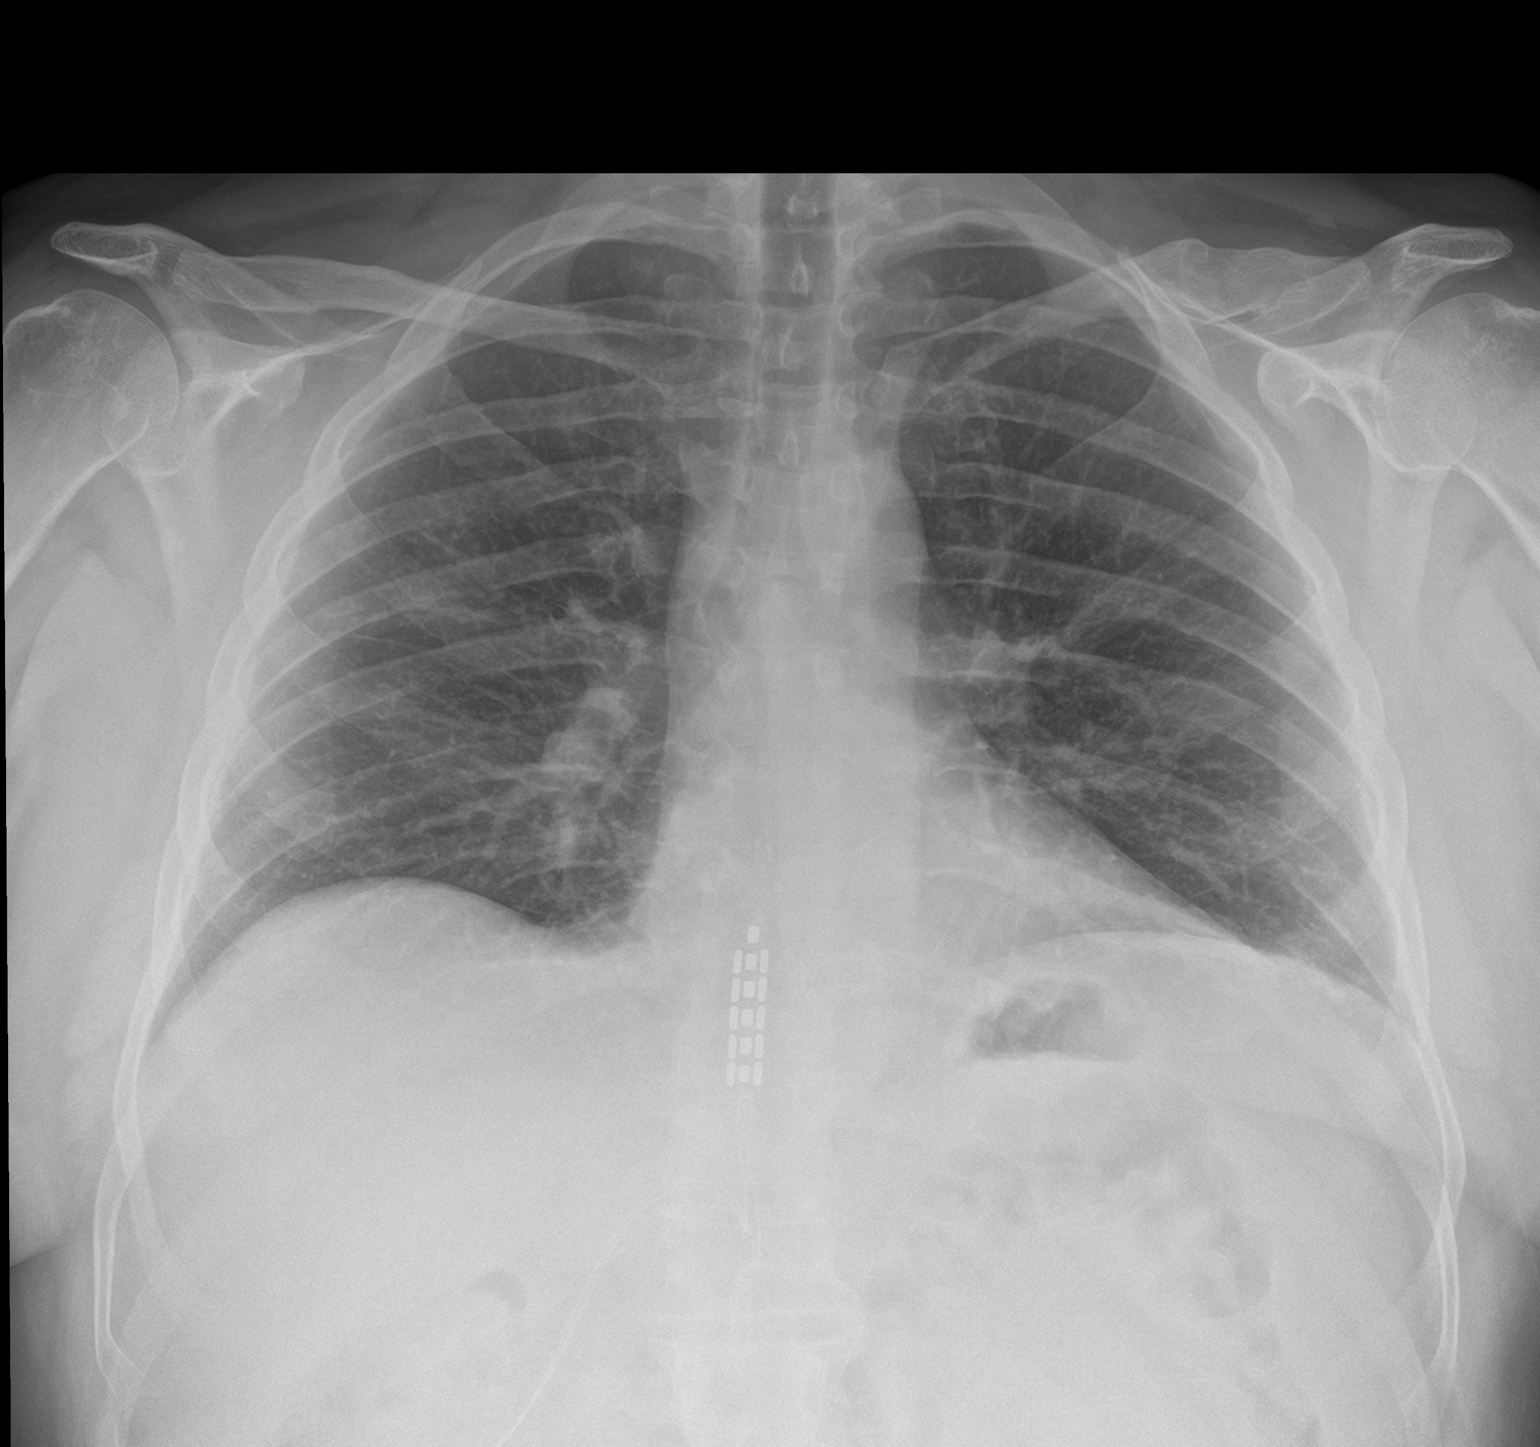

[chest lat]
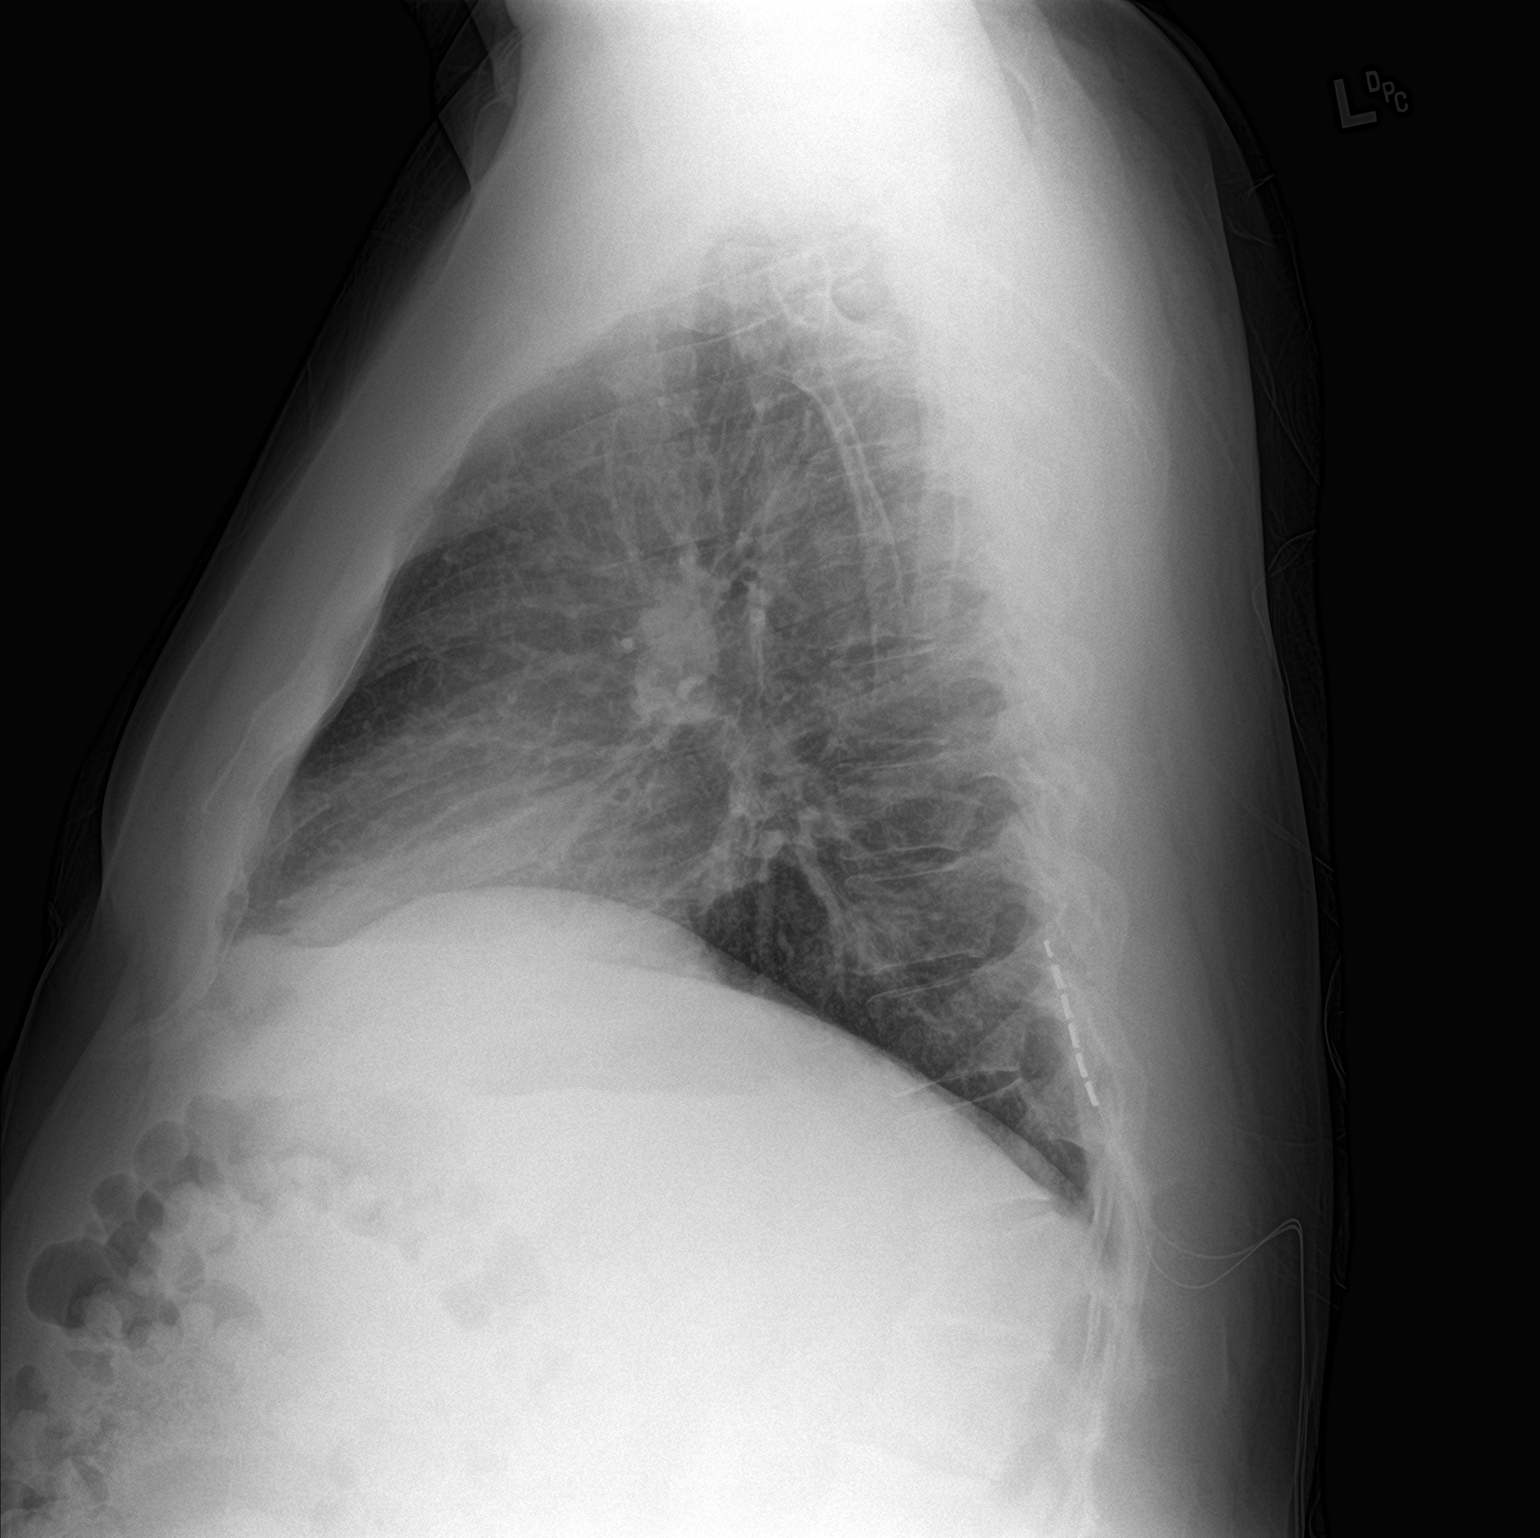

[2 of 2 positions shown; findings below may reference images not displayed]

FINDINGS: The heart size and mediastinal contours are within normal limits.
Both lungs are clear. Neurostimulator leads again seen in the lower
thoracic spinal canal.
IMPRESSION: Stable exam.  No active cardiopulmonary disease.

## 2019-07-12 ENCOUNTER — Emergency Department (HOSPITAL_COMMUNITY)
Admission: EM | Admit: 2019-07-12 | Discharge: 2019-07-12 | Disposition: A | Discharge status: Home / Self Care | Payer: Medicare HMO | Attending: Emergency Medicine | Admitting: Emergency Medicine

## 2019-07-12 ENCOUNTER — Other Ambulatory Visit: Payer: Self-pay

## 2019-07-12 ENCOUNTER — Encounter (HOSPITAL_COMMUNITY): Payer: Self-pay | Admitting: Emergency Medicine

## 2019-07-12 DIAGNOSIS — Z5321 Procedure and treatment not carried out due to patient leaving prior to being seen by health care provider: Secondary | ICD-10-CM | POA: Insufficient documentation

## 2019-07-12 DIAGNOSIS — R0602 Shortness of breath: Secondary | ICD-10-CM | POA: Insufficient documentation

## 2019-07-12 NOTE — ED Triage Notes (Signed)
Patient states he has a history of COPD and has been exposed to Covid 19. Shortness of breath started about a week ago and is worse at night. Patient denies V/D, fever, and home o2.

## 2019-07-28 ENCOUNTER — Emergency Department (HOSPITAL_COMMUNITY)
Admission: EM | Admit: 2019-07-28 | Discharge: 2019-07-28 | Disposition: A | Discharge status: Home / Self Care | Payer: Medicare HMO | Attending: Emergency Medicine | Admitting: Emergency Medicine

## 2019-07-28 ENCOUNTER — Encounter (HOSPITAL_COMMUNITY): Payer: Self-pay | Admitting: Emergency Medicine

## 2019-07-28 ENCOUNTER — Emergency Department (HOSPITAL_COMMUNITY): Payer: Medicare HMO

## 2019-07-28 ENCOUNTER — Other Ambulatory Visit: Payer: Self-pay

## 2019-07-28 DIAGNOSIS — J40 Bronchitis, not specified as acute or chronic: Secondary | ICD-10-CM | POA: Diagnosis not present

## 2019-07-28 DIAGNOSIS — Z79899 Other long term (current) drug therapy: Secondary | ICD-10-CM | POA: Insufficient documentation

## 2019-07-28 DIAGNOSIS — Z87891 Personal history of nicotine dependence: Secondary | ICD-10-CM | POA: Insufficient documentation

## 2019-07-28 DIAGNOSIS — I10 Essential (primary) hypertension: Secondary | ICD-10-CM | POA: Diagnosis not present

## 2019-07-28 DIAGNOSIS — J449 Chronic obstructive pulmonary disease, unspecified: Secondary | ICD-10-CM | POA: Insufficient documentation

## 2019-07-28 DIAGNOSIS — R05 Cough: Secondary | ICD-10-CM | POA: Diagnosis present

## 2019-07-28 MED ORDER — PREDNISONE 20 MG PO TABS: 40.0000 mg | ORAL_TABLET | Freq: Every day | ORAL | 0 refills | Status: DC

## 2019-07-28 MED ORDER — DOXYCYCLINE HYCLATE 100 MG PO CAPS: 100.0000 mg | ORAL_CAPSULE | Freq: Two times a day (BID) | ORAL | 0 refills | Status: DC

## 2019-07-28 MED ORDER — AEROCHAMBER PLUS FLO-VU MEDIUM MISC: 1.0000 | Freq: Once | 0 refills | Status: AC

## 2019-07-28 MED ORDER — PREDNISONE 20 MG PO TABS
40.0000 mg | ORAL_TABLET | Freq: Once | ORAL | Status: AC
  Administered 2019-07-28: 40 mg via ORAL
  Filled 2019-07-28: qty 2
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
## 2019-07-28 NOTE — ED Triage Notes (Signed)
Pt had covid a few weeks ago and continues to have a cough and wheezing. No nebs used at home, only inhaler.

## 2019-07-28 NOTE — Discharge Instructions (Addendum)
Please take the following medications, you likely have bronchitis but no signs of pneumonia on the x-ray   Albuterol inhaler with a spacer, take 2 puffs every 4 hours as needed for shortness of breath or wheezing Prednisone, 40 mg daily for 5 days Doxycycline 100mg  by mouth twice daily until the medicine is completely finished - this medicine is a strong antibiotic that treats certain infections.    Return to the emergency department for severe or worsening chest pain difficulty breathing fevers or shortness of breath.

## 2019-08-01 ENCOUNTER — Encounter (HOSPITAL_COMMUNITY): Payer: Self-pay

## 2019-08-01 ENCOUNTER — Emergency Department (HOSPITAL_COMMUNITY): Payer: Medicare HMO

## 2019-08-01 ENCOUNTER — Other Ambulatory Visit: Payer: Self-pay

## 2019-08-01 ENCOUNTER — Emergency Department (HOSPITAL_COMMUNITY)
Admission: EM | Admit: 2019-08-01 | Discharge: 2019-08-01 | Disposition: A | Discharge status: Home / Self Care | Payer: Medicare HMO | Attending: Emergency Medicine | Admitting: Emergency Medicine

## 2019-08-01 DIAGNOSIS — I10 Essential (primary) hypertension: Secondary | ICD-10-CM | POA: Diagnosis not present

## 2019-08-01 DIAGNOSIS — J449 Chronic obstructive pulmonary disease, unspecified: Secondary | ICD-10-CM | POA: Insufficient documentation

## 2019-08-01 DIAGNOSIS — R0602 Shortness of breath: Secondary | ICD-10-CM | POA: Diagnosis not present

## 2019-08-01 DIAGNOSIS — E876 Hypokalemia: Secondary | ICD-10-CM | POA: Diagnosis not present

## 2019-08-01 DIAGNOSIS — J40 Bronchitis, not specified as acute or chronic: Secondary | ICD-10-CM | POA: Insufficient documentation

## 2019-08-01 DIAGNOSIS — Z79899 Other long term (current) drug therapy: Secondary | ICD-10-CM | POA: Insufficient documentation

## 2019-08-01 DIAGNOSIS — Z87891 Personal history of nicotine dependence: Secondary | ICD-10-CM | POA: Insufficient documentation

## 2019-08-01 DIAGNOSIS — R05 Cough: Secondary | ICD-10-CM | POA: Diagnosis present

## 2019-08-01 LAB — COMPREHENSIVE METABOLIC PANEL
ALT: 27 U/L (ref 0–44)
AST: 19 U/L (ref 15–41)
Albumin: 4 g/dL (ref 3.5–5.0)
Alkaline Phosphatase: 55 U/L (ref 38–126)
Anion gap: 12 (ref 5–15)
BUN: 21 mg/dL — ABNORMAL HIGH (ref 6–20)
CO2: 27 mmol/L (ref 22–32)
Calcium: 8.8 mg/dL — ABNORMAL LOW (ref 8.9–10.3)
Chloride: 101 mmol/L (ref 98–111)
Creatinine, Ser: 0.99 mg/dL (ref 0.61–1.24)
GFR calc Af Amer: >60 mL/min (ref >60)
GFR calc non Af Amer: >60 mL/min (ref >60)
Glucose, Bld: 98 mg/dL (ref 70–99)
Potassium: 3 mmol/L — ABNORMAL LOW (ref 3.5–5.1)
Sodium: 140 mmol/L (ref 135–145)
Total Bilirubin: 1 mg/dL (ref 0.3–1.2)
Total Protein: 6.9 g/dL (ref 6.5–8.1)

## 2019-08-01 LAB — CBC WITH DIFFERENTIAL/PLATELET
Abs Immature Granulocytes: 0.1 10*3/uL — ABNORMAL HIGH (ref 0.00–0.07)
Basophils Absolute: 0.1 10*3/uL (ref 0.0–0.1)
Basophils Relative: 1 %
Eosinophils Absolute: 0.1 10*3/uL (ref 0.0–0.5)
Eosinophils Relative: 1 %
HCT: 44.8 % (ref 39.0–52.0)
Hemoglobin: 15.4 g/dL (ref 13.0–17.0)
Immature Granulocytes: 1 %
Lymphocytes Relative: 24 %
Lymphs Abs: 3.2 10*3/uL (ref 0.7–4.0)
MCH: 29.4 pg (ref 26.0–34.0)
MCHC: 34.4 g/dL (ref 30.0–36.0)
MCV: 85.7 fL (ref 80.0–100.0)
Monocytes Absolute: 1.1 10*3/uL — ABNORMAL HIGH (ref 0.1–1.0)
Monocytes Relative: 8 %
Neutro Abs: 8.7 10*3/uL — ABNORMAL HIGH (ref 1.7–7.7)
Neutrophils Relative %: 65 %
Platelets: 253 10*3/uL (ref 150–400)
RBC: 5.23 MIL/uL (ref 4.22–5.81)
RDW: 12.9 % (ref 11.5–15.5)
WBC: 13.1 10*3/uL — ABNORMAL HIGH (ref 4.0–10.5)
nRBC: 0 % (ref 0.0–0.2)

## 2019-08-01 LAB — D-DIMER, QUANTITATIVE: D-Dimer, Quant: 0.66 ug/mL-FEU — ABNORMAL HIGH (ref 0.00–0.50)

## 2019-08-01 LAB — BRAIN NATRIURETIC PEPTIDE: B Natriuretic Peptide: 33 pg/mL (ref 0.0–100.0)

## 2019-08-01 MED ORDER — POTASSIUM CHLORIDE CRYS ER 20 MEQ PO TBCR
40.0000 meq | EXTENDED_RELEASE_TABLET | Freq: Once | ORAL | Status: AC
  Administered 2019-08-01: 13:00:00 40 meq via ORAL
  Filled 2019-08-01: qty 2

## 2019-08-01 MED ORDER — IOHEXOL 350 MG/ML SOLN
75.0000 mL | Freq: Once | INTRAVENOUS | Status: AC | PRN
  Administered 2019-08-01: 75 mL via INTRAVENOUS

## 2019-08-01 MED ORDER — OXYCODONE-ACETAMINOPHEN 5-325 MG PO TABS
2.0000 | ORAL_TABLET | Freq: Once | ORAL | Status: AC
  Administered 2019-08-01: 11:00:00 2 via ORAL
  Filled 2019-08-01: qty 2

## 2019-08-01 NOTE — ED Notes (Signed)
Pt have removed telemetry and everything off, pt placed back on telemetry
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
## 2019-08-01 NOTE — ED Triage Notes (Signed)
Pt reports had covid approx  3 weeks ago.  Reports got better but started having cough and congestion again 2 weeks ago.

## 2019-08-01 NOTE — Discharge Instructions (Addendum)
Continue use your albuterol inhaler extensive work-up today to include a repeat chest x-ray CT angio of the chest no signs of blood clots or pneumonia.  He certainly have a component of bronchitis and this may very well have been complicated a little bit by your presumed COVID-19 infection from 3 weeks ago.  Return for any new or worse symptoms.  Make an appointment to follow-up with pulmonary medicine.

## 2019-08-01 NOTE — ED Notes (Signed)
Accidentally had pt sign transfer form vs discharge form.  Pt verbalized understanding of d/c instructions.

## 2019-08-07 ENCOUNTER — Encounter: Payer: Self-pay | Admitting: Internal Medicine

## 2019-08-07 ENCOUNTER — Other Ambulatory Visit: Payer: Self-pay

## 2019-08-07 ENCOUNTER — Ambulatory Visit: Payer: Medicare HMO | Admitting: Internal Medicine

## 2019-08-07 VITALS — BP 140/98 | HR 108 | Temp 97.3°F | Ht 67.0 in | Wt 248.6 lb

## 2019-08-07 DIAGNOSIS — J449 Chronic obstructive pulmonary disease, unspecified: Secondary | ICD-10-CM | POA: Diagnosis not present

## 2019-08-07 DIAGNOSIS — G4733 Obstructive sleep apnea (adult) (pediatric): Secondary | ICD-10-CM | POA: Diagnosis not present

## 2019-08-07 DIAGNOSIS — U071 COVID-19: Secondary | ICD-10-CM

## 2019-08-07 DIAGNOSIS — G4710 Hypersomnia, unspecified: Secondary | ICD-10-CM | POA: Diagnosis not present
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
## 2019-09-17 ENCOUNTER — Telehealth: Payer: Self-pay | Admitting: Internal Medicine

## 2019-09-17 DIAGNOSIS — R0602 Shortness of breath: Secondary | ICD-10-CM

## 2019-09-17 NOTE — Telephone Encounter (Signed)
Since a second opinion is required by the state for his interlock system and not pt request to switch providers, he can be scheduled with next available 30 minute slot for a MD.   Spirometry is ordered, so can be scheduled next available.  lmtcb X1 for pt to schedule spiro and OV with second opinion provider.

## 2019-09-17 NOTE — Telephone Encounter (Signed)
pt came in to drop off form for an ignition interlock system on his car. Pt states that he needs the form filled out because he can't blow as hard as needed to unlock the ignition. The form does state that he will need a spirometry test - I can't schedule until we have the test ordered by Dr. Shearon Stalls. - Also - pt is inquiring about seeing a second Dr. (as required by the form) and wanted to know if he can see someone else here. I let him know our policy on seeing a second Dr. Within the practice but told him I would ask if this was a different scenario or not. Please call pt to inform him of the test and seeing a second Dr. Within the practice or not CB# [phone]

## 2019-09-17 NOTE — Telephone Encounter (Signed)
I have ordered spirometry.  He is welcome to see me, but I also have no objection to him following up with another provider.

## 2019-09-17 NOTE — Telephone Encounter (Signed)
Called and spoke to pt. Informed him of the process. Will need to clear order with Dr. Shearon Stalls first.   Dr. Shearon Stalls please advise if ok with ordering spirometry test and scheduling visit with you for pt's ignition interlock system form.

## 2019-09-18 NOTE — Telephone Encounter (Signed)
Form has located in Dr. Mauricio Po folder. Will keep in her pink folder in [place].

## 2019-09-19 NOTE — Telephone Encounter (Signed)
Thanks I will keep an eye out for his spirometry and visit. I think it is ok to schedule the covid test to get the spirometry going for him. We never had a positive test.

## 2019-09-19 NOTE — Telephone Encounter (Signed)
ATC pt, there was no answer and I could not leave a message. 

## 2019-09-19 NOTE — Telephone Encounter (Signed)
I think there has been some confusion -  These interlock forms REQUIRE 2 separate spirometries, read by 2 providers on 2 different dates of service.  Called spoke with patient to discuss and schedule.  When informing patient about the need for covid testing, patient stated that he has already had covid mid-December.  I asked patient where he was tested because there are no results in his chart - he was NOT tested but stated that his spouse, mother and grandchildren were all positive, he was around them and had their same symptoms.  Discussed with patient that covid testing cannot be performed within 90 days of positive test results, but because patient was never tested will need to send message back to Dr Shearon Stalls to recommendations on how to proceed.  Sorry Dr Shearon Stalls - please advise, thank you.

## 2019-09-24 NOTE — Telephone Encounter (Signed)
Called and spoke to pt. Appts made for COVID test on 3/1, Spirometry scheduled for 3/4, and Dr. Shearon Stalls OV scheduled for 3/5. Pt aware of all visits and is aware the next set of appts can be scheduled at his upcoming visit with Dr. Shearon Stalls. Pt verbalized understanding and denied any further questions or concerns at this time.

## 2019-10-01 ENCOUNTER — Other Ambulatory Visit (HOSPITAL_COMMUNITY)
Admission: RE | Admit: 2019-10-01 | Discharge: 2019-10-01 | Disposition: A | Discharge status: Home / Self Care | Payer: Medicare HMO | Source: Ambulatory Visit | Attending: Internal Medicine | Admitting: Internal Medicine

## 2019-10-01 DIAGNOSIS — Z20822 Contact with and (suspected) exposure to covid-19: Secondary | ICD-10-CM | POA: Insufficient documentation

## 2019-10-01 DIAGNOSIS — Z01812 Encounter for preprocedural laboratory examination: Secondary | ICD-10-CM | POA: Insufficient documentation

## 2019-10-01 LAB — SARS CORONAVIRUS 2 (TAT 6-24 HRS): SARS Coronavirus 2: NEGATIVE

## 2019-10-04 ENCOUNTER — Ambulatory Visit (INDEPENDENT_AMBULATORY_CARE_PROVIDER_SITE_OTHER): Payer: Medicare HMO

## 2019-10-04 ENCOUNTER — Other Ambulatory Visit: Payer: Self-pay

## 2019-10-04 DIAGNOSIS — R0602 Shortness of breath: Secondary | ICD-10-CM | POA: Diagnosis not present

## 2019-10-05 ENCOUNTER — Encounter: Payer: Self-pay | Admitting: Internal Medicine

## 2019-10-05 ENCOUNTER — Ambulatory Visit: Payer: Medicare HMO | Admitting: Internal Medicine

## 2019-10-05 VITALS — BP 138/80 | HR 90 | Temp 97.0°F | Ht 67.0 in | Wt 254.4 lb

## 2019-10-05 DIAGNOSIS — J449 Chronic obstructive pulmonary disease, unspecified: Secondary | ICD-10-CM

## 2019-10-05 DIAGNOSIS — G4733 Obstructive sleep apnea (adult) (pediatric): Secondary | ICD-10-CM | POA: Diagnosis not present

## 2019-10-05 NOTE — Patient Instructions (Addendum)
The patient should have follow up scheduled in 6 months with [facility].   We will fill out a prior authorization for your Anoro. This may take some time.
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
## 2019-10-10 ENCOUNTER — Telehealth: Payer: Self-pay | Admitting: Internal Medicine

## 2019-10-10 NOTE — Telephone Encounter (Signed)
I called and spoke with the patient and was able to get him scheduled with Dr. Melvyn Novas tomorrow and Laurie Panda it with Magda Paganini. Nothing further is needed.

## 2019-10-10 NOTE — Telephone Encounter (Signed)
Yes ok to set up - I thought we did that before he left from his appt with me.

## 2019-10-10 NOTE — Telephone Encounter (Signed)
Spoke with the pt  He states has to have form filled out for [facility] (ignition interlock) and needs to see another physician in the office for this purpose b/c needs 2 separate opinions on his spirometry  Dr Shearon Stalls are you okay with this?  Please advise, thanks!

## 2019-10-11 ENCOUNTER — Ambulatory Visit (INDEPENDENT_AMBULATORY_CARE_PROVIDER_SITE_OTHER): Payer: Medicare HMO | Admitting: Internal Medicine

## 2019-10-11 ENCOUNTER — Other Ambulatory Visit: Payer: Self-pay

## 2019-10-11 ENCOUNTER — Encounter: Payer: Self-pay | Admitting: Internal Medicine

## 2019-10-11 DIAGNOSIS — J449 Chronic obstructive pulmonary disease, unspecified: Secondary | ICD-10-CM | POA: Diagnosis not present

## 2019-10-11 DIAGNOSIS — I10 Essential (primary) hypertension: Secondary | ICD-10-CM | POA: Diagnosis not present

## 2019-10-11 DIAGNOSIS — R0600 Dyspnea, unspecified: Secondary | ICD-10-CM

## 2019-10-11 DIAGNOSIS — R0609 Other forms of dyspnea: Secondary | ICD-10-CM

## 2019-10-11 LAB — BASIC METABOLIC PANEL
BUN: 18 mg/dL (ref 6–23)
CO2: 29 mEq/L (ref 19–32)
Calcium: 9.3 mg/dL (ref 8.4–10.5)
Chloride: 102 mEq/L (ref 96–112)
Creatinine, Ser: 0.99 mg/dL (ref 0.40–1.50)
GFR: 78.93 mL/min (ref >60.00)
Glucose, Bld: 93 mg/dL (ref 70–99)
Potassium: 4.1 mEq/L (ref 3.5–5.1)
Sodium: 138 mEq/L (ref 135–145)

## 2019-10-11 LAB — TSH: TSH: 2.17 u[IU]/mL (ref 0.35–4.50)

## 2019-10-11 LAB — SARS-COV-2 IGG: SARS-COV-2 IgG: 2.45

## 2019-10-11 MED ORDER — TELMISARTAN 80 MG PO TABS: 80.0000 mg | ORAL_TABLET | Freq: Every day | ORAL | 11 refills | Status: DC

## 2019-10-11 MED ORDER — STIOLTO RESPIMAT 2.5-2.5 MCG/ACT IN AERS: 2.0000 | INHALATION_SPRAY | Freq: Every day | RESPIRATORY_TRACT | 0 refills | Status: DC
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
## 2019-10-11 NOTE — Assessment & Plan Note (Signed)
Quit smoking 2014 at wt around 200 lbs  - spirometry 10/05/19 s airflow obst ? While on stiolto?  - 10/11/2019  After extensive coaching inhaler device,  effectiveness =    90% with SMI > continue stiolto   When respiratory symptoms begin or become refractory well after a patient reports complete smoking cessation,  Especially when this wasn't the case while they were smoking, a red flag is raised based on the work of Dr Kris Mouton which states:  if you quit smoking when your best day FEV1 is still well preserved it is highly unlikely you will progress to severe disease.  This has proven to be the case here   That is to say, once the smoking stops,  the symptoms should not suddenly erupt or markedly worsen.  If so, the differential diagnosis should include  obesity/deconditioning,  LPR/Reflux/Aspiration syndromes,  occult CHF, or  especially side effect of medications commonly used in this population esp acei  rec  Continue stiolto for now and reduce saba use as per avs Continue gerd rx Try off acei and regroup in 6 weeks

## 2019-10-11 NOTE — Patient Instructions (Signed)
I don't believe you are capable of using an interlock based on your breathing problems due to your weight and your lisinopril   Stop lisinopril and start micardis 80 mg one daily (olmesartan)  Plan A = Automatic = Always=    stiotol 2 pffs each am continue  Plan B = Backup (to supplement plan A, not to replace it) Only use your albuterol (proair/ red)  inhaler as a rescue medication to be used if you can't catch your breath by resting or doing a relaxed purse lip breathing pattern.  - The less you use it, the better it will work when you need it. - Ok to use the inhaler up to 2 puffs  every 4 hours if you must but call for appointment if use goes up over your usual need - Don't leave home without it !!  (think of it like the spare tire for your car)   Please remember to go to the lab department   for your tests - we will call you with the results when they are available.       Please schedule a follow up office visit in 6 weeks, call sooner if needed in [place] bring all your medications

## 2019-10-11 NOTE — Assessment & Plan Note (Addendum)
Onset 2014 - Spirometry 09/18/14 FEV1 3.14 (87%)  Ratio 0.81 with minimal curvature @wt  210 - Spirometry 10/04/19  FEV1 68 (68%)  Ratio 0.84 with no curvature @ wt 254  - 10/11/2019   Walked RA x two laps =  approx 522ft @ nl pace - stopped due to end of study with sats of 94 % with mild sob  at the end of the study/ had to slow down to finish   Symptoms are markedly disproportionate to objective findings and not clear to what extent this is actually a pulmonary  problem but pt does appear to have difficult to sort out respiratory symptoms of unknown origin for which  DDX  = almost all start with A and  include Adherence, Ace Inhibitors, Acid Reflux, Active Sinus Disease, Alpha 1 Antitripsin deficiency, Anxiety masquerading as Airways dz,  ABPA,  Allergy(esp in young), Aspiration (esp in elderly), Adverse effects of meds,  Active smoking or Vaping, A bunch of PE's/clot burden (a few small clots can't cause this syndrome unless there is already severe underlying pulm or vascular dz with poor reserve),  Anemia or thyroid disorder, plus two Bs  = Bronchiectasis and Beta blocker use..and one C= CHF     Adherence is always the initial "prime suspect" and is a multilayered concern that requires a "trust but verify" approach in every patient - starting with knowing how to use medications, especially inhalers, correctly, keeping up with refills and understanding the fundamental difference between maintenance and prns vs those medications only taken for a very short course and then stopped and not refilled.  - see smi Eligha Bridegroom teaching sep a/p - return  with all meds in hand using a trust but verify approach to confirm accurate Medication  Reconciliation The principal here is that until we are certain that the  patients are doing what we've asked, it makes no sense to ask them to do more.    ACEi adverse effects at the  top of the usual list of suspects and the only way to rule it out is a trial off > see a/p    ?  Anxiety/depression/ deconditioning assoc with wt gain  > usually at the bottom of this list of usual suspects but should be much higher on this pt's based on H and P and note already on psychotropics and may interfere with adherence and also interpretation of response or lack thereof to symptom management which can be quite subjective.   ? Anemia/ thyroid dz   Lab Results  Component Value Date   HGB 15.4 08/01/2019   HGB 15.4 10/03/2018   HGB 15.3 07/04/2018    Lab Results  Component Value Date   TSH 2.17 10/11/2019     ? Allergy/abpa > check IgE but hold off on ICS for now  ? Alpha one at > check level    ? Acid (or non-acid) GERD > always difficult to exclude as up to 75% of pts in some series report no assoc GI/ Heartburn symptoms> rec continue ppi qd ac   ? A bunch of PE's > see CTa during flare neg pe   ? chf > bnp also nl during flare

## 2019-10-11 NOTE — Assessment & Plan Note (Signed)
Try off acei 10/11/2019 and on micardis 80 mg one daily   In the best review of chronic cough to date ( NEJM 2016 375 [id number]) ,  ACEi are now felt to cause cough in up to  20% of pts which is a 4 fold increase from previous reports and does not include the variety of non-specific complaints we see in pulmonary clinic in pts on ACEi but previously attributed to another dx like  Copd/asthma and  include PNDS, throat and chest congestion, "bronchitis", unexplained dyspnea and noct "strangling" sensations, and hoarseness, but also  atypical /refractory GERD symptoms like dysphagia and "bad heartburn"   The only way I know  to prove this is not an "ACEi Case" is a trial off ACEi x a minimum of 6 weeks then regroup.   >>> try micardis 80 mg one daily x 6 weeks then regroup          Each maintenance medication was reviewed in detail including emphasizing most importantly the difference between maintenance and prns and under what circumstances the prns are to be triggered using an action plan format where appropriate.  Total time for H and P, chart review, counseling, teaching device/  directly observing portions of ambulatory 02 saturation study/  and generating customized AVS unique to this office consultation / charting = 60 min

## 2019-10-11 NOTE — Assessment & Plan Note (Signed)
Body mass index is 40.06 kg/m.   Lab Results  Component Value Date   TSH 2.17 10/11/2019     Contributing to gerd risk/ doe/reviewed the need and the process to achieve and maintain neg calorie balance > defer f/u primary care including intermittently monitoring thyroid status

## 2019-10-20 LAB — IGE: IgE (Immunoglobulin E), Serum: 15 kU/L (ref <=114)

## 2019-10-20 LAB — ALPHA-1 ANTITRYPSIN PHENOTYPE: A-1 Antitrypsin, Ser: 150 mg/dL (ref 83–199)

## 2019-11-02 ENCOUNTER — Other Ambulatory Visit: Payer: Self-pay | Admitting: Gastroenterology

## 2019-11-06 NOTE — Telephone Encounter (Signed)
Gave refill but needs office visit before additional refills.

## 2019-12-20 ENCOUNTER — Ambulatory Visit: Payer: Medicare HMO | Admitting: Internal Medicine

## 2019-12-21 ENCOUNTER — Ambulatory Visit: Payer: Medicare HMO | Admitting: Internal Medicine

## 2019-12-21 ENCOUNTER — Encounter: Payer: Self-pay | Admitting: Internal Medicine

## 2019-12-21 ENCOUNTER — Other Ambulatory Visit: Payer: Self-pay

## 2019-12-21 DIAGNOSIS — R0600 Dyspnea, unspecified: Secondary | ICD-10-CM | POA: Diagnosis not present

## 2019-12-21 DIAGNOSIS — I10 Essential (primary) hypertension: Secondary | ICD-10-CM

## 2019-12-21 DIAGNOSIS — J449 Chronic obstructive pulmonary disease, unspecified: Secondary | ICD-10-CM

## 2019-12-21 DIAGNOSIS — R0609 Other forms of dyspnea: Secondary | ICD-10-CM
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
## 2019-12-21 NOTE — Assessment & Plan Note (Signed)
Try off acei 10/11/2019 and on micardis 80 mg one daily > better 12/21/2019   Although even in retrospect it may not be clear the ACEi contributed to the pt's symptoms,  Pt improved off them and adding them back at this point or in the future would risk confusion in interpretation of non-specific respiratory symptoms to which this patient is prone  ie  Better not to muddy the waters here.   >>> no change micardis   F/u in 3 m         Each maintenance medication was reviewed in detail including emphasizing most importantly the difference between maintenance and prns and under what circumstances the prns are to be triggered using an action plan format where appropriate.  Total time for H and P, chart review, counseling,   and generating customized AVS unique to this office visit / charting = 20 min

## 2019-12-21 NOTE — Patient Instructions (Signed)
Ok to try stiolto one puff each am for a few weeks then if doing fine just try off it.   Please schedule a follow up visit in 3 months but call sooner if needed

## 2019-12-21 NOTE — Assessment & Plan Note (Signed)
Onset 2014 - Spirometry 09/18/14 FEV1 3.14 (87%)  Ratio 0.81 with minimal curvature @wt  210 - Spirometry 10/04/19  FEV1 2.3  (68%)  Ratio 0.84 with no curvature @ wt 254  - 10/11/2019   Walked RA x two laps =  approx 522ft @ nl pace - stopped due to end of study with sats of 94 % with mild sob  at the end of the study/ had to slow down to finish   Due to restriction/obesity and deconditioning, not airflow obst

## 2019-12-21 NOTE — Assessment & Plan Note (Signed)
Quit smoking 2014 at wt around 200 lbs  - spirometry 10/05/19 s airflow obst ? While on stiolto?  - 10/11/2019  After extensive coaching inhaler device,  effectiveness =    90% with SMI > continue stiolto - Allergy profile  10/11/19    IgE   15 - alpha one AT phenotype  10/11/19 MM 150  - try taper off stiolto 12/21/2019 >>>  No obvious need for laba/lama here > try taper off

## 2020-01-29 ENCOUNTER — Encounter: Payer: Self-pay | Admitting: Internal Medicine

## 2020-01-29 ENCOUNTER — Other Ambulatory Visit: Payer: Self-pay | Admitting: Gastroenterology

## 2020-01-29 NOTE — Telephone Encounter (Signed)
Needs office visit. I refilled X 1.

## 2020-03-07 ENCOUNTER — Encounter: Payer: Self-pay | Admitting: Internal Medicine

## 2020-04-14 ENCOUNTER — Ambulatory Visit: Payer: Medicare HMO | Admitting: Gastroenterology

## 2020-04-15 ENCOUNTER — Encounter: Payer: Self-pay | Admitting: Internal Medicine

## 2020-04-15 ENCOUNTER — Other Ambulatory Visit: Payer: Self-pay

## 2020-04-15 ENCOUNTER — Ambulatory Visit: Payer: Medicare HMO | Admitting: Internal Medicine

## 2020-04-15 VITALS — BP 149/96 | HR 101 | Temp 97.0°F | Ht 67.0 in | Wt 256.8 lb

## 2020-04-15 DIAGNOSIS — K219 Gastro-esophageal reflux disease without esophagitis: Secondary | ICD-10-CM | POA: Diagnosis not present
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
## 2020-04-15 NOTE — Patient Instructions (Signed)
Continue Protonix 40 mg daily  GERD information  20 pound weight loss over the next year will help you overall  Repeat colonoscopy 2028  OV here as needed

## 2020-05-02 ENCOUNTER — Other Ambulatory Visit: Payer: Self-pay | Admitting: Gastroenterology

## 2020-10-10 ENCOUNTER — Other Ambulatory Visit: Payer: Self-pay | Admitting: Internal Medicine

## 2021-04-04 ENCOUNTER — Emergency Department (HOSPITAL_COMMUNITY)
Admission: EM | Admit: 2021-04-04 | Discharge: 2021-04-04 | Disposition: A | Discharge status: Home / Self Care | Payer: Medicare HMO | Attending: Emergency Medicine | Admitting: Emergency Medicine

## 2021-04-04 ENCOUNTER — Other Ambulatory Visit: Payer: Self-pay

## 2021-04-04 ENCOUNTER — Encounter (HOSPITAL_COMMUNITY)
Admission: EM | Disposition: A | Discharge status: Home / Self Care | Payer: Self-pay | Source: Home / Self Care | Attending: Emergency Medicine

## 2021-04-04 ENCOUNTER — Encounter (HOSPITAL_COMMUNITY): Payer: Self-pay | Admitting: Anesthesiology

## 2021-04-04 ENCOUNTER — Emergency Department (HOSPITAL_COMMUNITY): Payer: Medicare HMO

## 2021-04-04 ENCOUNTER — Encounter (HOSPITAL_COMMUNITY): Payer: Self-pay | Admitting: *Deleted

## 2021-04-04 DIAGNOSIS — Z87891 Personal history of nicotine dependence: Secondary | ICD-10-CM | POA: Diagnosis not present

## 2021-04-04 DIAGNOSIS — Z79899 Other long term (current) drug therapy: Secondary | ICD-10-CM | POA: Diagnosis not present

## 2021-04-04 DIAGNOSIS — I10 Essential (primary) hypertension: Secondary | ICD-10-CM | POA: Insufficient documentation

## 2021-04-04 DIAGNOSIS — J449 Chronic obstructive pulmonary disease, unspecified: Secondary | ICD-10-CM | POA: Insufficient documentation

## 2021-04-04 DIAGNOSIS — K353 Acute appendicitis with localized peritonitis, without perforation or gangrene: Secondary | ICD-10-CM | POA: Diagnosis not present

## 2021-04-04 DIAGNOSIS — R1031 Right lower quadrant pain: Secondary | ICD-10-CM | POA: Diagnosis present

## 2021-04-04 DIAGNOSIS — Z20822 Contact with and (suspected) exposure to covid-19: Secondary | ICD-10-CM | POA: Diagnosis not present

## 2021-04-04 DIAGNOSIS — Z01818 Encounter for other preprocedural examination: Secondary | ICD-10-CM

## 2021-04-04 LAB — COMPREHENSIVE METABOLIC PANEL
ALT: 26 U/L (ref 0–44)
AST: 16 U/L (ref 15–41)
Albumin: 4.2 g/dL (ref 3.5–5.0)
Alkaline Phosphatase: 90 U/L (ref 38–126)
Anion gap: 8 (ref 5–15)
BUN: 14 mg/dL (ref 6–20)
CO2: 27 mmol/L (ref 22–32)
Calcium: 9.1 mg/dL (ref 8.9–10.3)
Chloride: 103 mmol/L (ref 98–111)
Creatinine, Ser: 0.8 mg/dL (ref 0.61–1.24)
GFR, Estimated: >60 mL/min (ref >60)
Glucose, Bld: 97 mg/dL (ref 70–99)
Potassium: 3.8 mmol/L (ref 3.5–5.1)
Sodium: 138 mmol/L (ref 135–145)
Total Bilirubin: 0.8 mg/dL (ref 0.3–1.2)
Total Protein: 7.2 g/dL (ref 6.5–8.1)

## 2021-04-04 LAB — CBC WITH DIFFERENTIAL/PLATELET
Abs Immature Granulocytes: 0.05 10*3/uL (ref 0.00–0.07)
Basophils Absolute: 0.1 10*3/uL (ref 0.0–0.1)
Basophils Relative: 0 %
Eosinophils Absolute: 0.1 10*3/uL (ref 0.0–0.5)
Eosinophils Relative: 1 %
HCT: 40.9 % (ref 39.0–52.0)
Hemoglobin: 14.2 g/dL (ref 13.0–17.0)
Immature Granulocytes: 0 %
Lymphocytes Relative: 16 %
Lymphs Abs: 2.4 10*3/uL (ref 0.7–4.0)
MCH: 31 pg (ref 26.0–34.0)
MCHC: 34.7 g/dL (ref 30.0–36.0)
MCV: 89.3 fL (ref 80.0–100.0)
Monocytes Absolute: 1.3 10*3/uL — ABNORMAL HIGH (ref 0.1–1.0)
Monocytes Relative: 8 %
Neutro Abs: 11.3 10*3/uL — ABNORMAL HIGH (ref 1.7–7.7)
Neutrophils Relative %: 75 %
Platelets: 264 10*3/uL (ref 150–400)
RBC: 4.58 MIL/uL (ref 4.22–5.81)
RDW: 12.5 % (ref 11.5–15.5)
WBC: 15.2 10*3/uL — ABNORMAL HIGH (ref 4.0–10.5)
nRBC: 0 % (ref 0.0–0.2)

## 2021-04-04 LAB — RESP PANEL BY RT-PCR (FLU A&B, COVID) ARPGX2
Influenza A by PCR: NEGATIVE
Influenza B by PCR: NEGATIVE
SARS Coronavirus 2 by RT PCR: NEGATIVE

## 2021-04-04 LAB — LIPASE, BLOOD: Lipase: 21 U/L (ref 11–51)

## 2021-04-04 SURGERY — CANCELLED PROCEDURE

## 2021-04-04 MED ORDER — HYDROMORPHONE HCL 1 MG/ML IJ SOLN
1.0000 mg | Freq: Once | INTRAMUSCULAR | Status: AC
  Administered 2021-04-04: 1 mg via INTRAVENOUS

## 2021-04-04 MED ORDER — SODIUM CHLORIDE 0.9 % IV SOLN
2.0000 g | Freq: Once | INTRAVENOUS | Status: AC
  Administered 2021-04-04: 2 g via INTRAVENOUS
  Filled 2021-04-04: qty 20

## 2021-04-04 MED ORDER — LIDOCAINE HCL (PF) 2 % IJ SOLN
INTRAMUSCULAR | Status: AC
  Filled 2021-04-04: qty 5

## 2021-04-04 MED ORDER — FENTANYL CITRATE (PF) 100 MCG/2ML IJ SOLN
INTRAMUSCULAR | Status: AC
  Filled 2021-04-04: qty 4

## 2021-04-04 MED ORDER — ONDANSETRON HCL 4 MG/2ML IJ SOLN
INTRAMUSCULAR | Status: AC
  Filled 2021-04-04: qty 2

## 2021-04-04 MED ORDER — PROPOFOL 10 MG/ML IV BOLUS
INTRAVENOUS | Status: AC
  Filled 2021-04-04: qty 20

## 2021-04-04 MED ORDER — AMOXICILLIN-POT CLAVULANATE 875-125 MG PO TABS: 1.0000 | ORAL_TABLET | Freq: Two times a day (BID) | ORAL | 0 refills | Status: DC

## 2021-04-04 MED ORDER — OXYCODONE-ACETAMINOPHEN 5-325 MG PO TABS: 1.0000 | ORAL_TABLET | ORAL | 0 refills | Status: AC | PRN

## 2021-04-04 MED ORDER — METRONIDAZOLE 500 MG/100ML IV SOLN
500.0000 mg | Freq: Once | INTRAVENOUS | Status: AC
  Administered 2021-04-04: 500 mg via INTRAVENOUS
  Filled 2021-04-04: qty 100

## 2021-04-04 MED ORDER — IOHEXOL 350 MG/ML SOLN
80.0000 mL | Freq: Once | INTRAVENOUS | Status: AC | PRN
  Administered 2021-04-04: 80 mL via INTRAVENOUS

## 2021-04-04 MED ORDER — HYDROMORPHONE HCL 1 MG/ML IJ SOLN
INTRAMUSCULAR | Status: AC
  Filled 2021-04-04: qty 1

## 2021-04-04 MED ORDER — BUPIVACAINE LIPOSOME 1.3 % IJ SUSP
INTRAMUSCULAR | Status: AC
  Filled 2021-04-04: qty 20

## 2021-04-04 MED ORDER — METOCLOPRAMIDE HCL 5 MG/ML IJ SOLN
INTRAMUSCULAR | Status: AC
  Filled 2021-04-04: qty 2

## 2021-04-04 MED ORDER — HYDROMORPHONE HCL 1 MG/ML IJ SOLN
1.0000 mg | Freq: Once | INTRAMUSCULAR | Status: AC
  Administered 2021-04-04: 1 mg via INTRAVENOUS
  Filled 2021-04-04: qty 1

## 2021-04-04 MED ORDER — SUCCINYLCHOLINE CHLORIDE 200 MG/10ML IV SOSY
PREFILLED_SYRINGE | INTRAVENOUS | Status: AC
  Filled 2021-04-04: qty 10

## 2021-04-04 MED ORDER — SODIUM CHLORIDE 0.9 % IV BOLUS
1000.0000 mL | Freq: Once | INTRAVENOUS | Status: AC
  Administered 2021-04-04: 1000 mL via INTRAVENOUS

## 2021-04-04 MED ORDER — ROCURONIUM BROMIDE 10 MG/ML (PF) SYRINGE
PREFILLED_SYRINGE | INTRAVENOUS | Status: AC
  Filled 2021-04-04: qty 10

## 2021-04-04 MED ORDER — DEXAMETHASONE SODIUM PHOSPHATE 10 MG/ML IJ SOLN
INTRAMUSCULAR | Status: AC
  Filled 2021-04-04: qty 1

## 2021-04-04 NOTE — Progress Notes (Signed)
Patient on phentermine, took last dose this morning, discussed with Dr. Arnoldo Morale, procedure will be cancelled and send patient on antibiotics home.

## 2021-04-04 NOTE — ED Triage Notes (Signed)
Pt with RLQ pain x 2 days, denies any N/V/D, denies any fevers.

## 2021-04-04 NOTE — Progress Notes (Signed)
Dr Arnoldo Morale talking with pt about d/c vs surgery due to pt taking a medication that is a risk for anesthesia.  Per Dr Charna Elizabeth
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
## 2021-04-04 NOTE — Progress Notes (Signed)
Pt dc, DC instructions given. Pt taken to ED and will wait with pt until ride gets here. Out of pre op at 2010
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
## 2021-04-07 ENCOUNTER — Telehealth (INDEPENDENT_AMBULATORY_CARE_PROVIDER_SITE_OTHER): Payer: Medicare HMO | Admitting: General Surgery

## 2021-04-07 DIAGNOSIS — K353 Acute appendicitis with localized peritonitis, without perforation or gangrene: Secondary | ICD-10-CM

## 2021-04-07 NOTE — Telephone Encounter (Signed)
Telephone visit performed with patient.  This was a follow-up of his acute appendicitis that was treated medically as he had a contraindication to surgical intervention.  He denies any fevers.  He states he is doing much better.  He has minimal pain.  He does receive multiple pain medications through a pain clinic.  He denies any nausea or vomiting.  I told him that should he worsen, he should return to the emergency room.  He is doing well with medical management of his appendicitis.  Total telephone time was 2 minutes.

## 2021-04-13 ENCOUNTER — Other Ambulatory Visit: Payer: Self-pay

## 2021-04-13 ENCOUNTER — Other Ambulatory Visit (HOSPITAL_COMMUNITY): Payer: Self-pay | Admitting: Gerontology

## 2021-04-13 ENCOUNTER — Ambulatory Visit (HOSPITAL_COMMUNITY)
Admission: RE | Admit: 2021-04-13 | Discharge: 2021-04-13 | Disposition: A | Discharge status: Home / Self Care | Payer: Medicare HMO | Source: Ambulatory Visit | Attending: Gerontology | Admitting: Gerontology

## 2021-04-13 DIAGNOSIS — M25561 Pain in right knee: Secondary | ICD-10-CM

## 2021-05-29 ENCOUNTER — Other Ambulatory Visit: Payer: Self-pay | Admitting: Gastroenterology

## 2021-05-29 MED ORDER — PANTOPRAZOLE SODIUM 40 MG PO TBEC: 40.0000 mg | DELAYED_RELEASE_TABLET | Freq: Every day | ORAL | 1 refills | Status: DC

## 2021-06-05 DIAGNOSIS — Z79899 Other long term (current) drug therapy: Secondary | ICD-10-CM | POA: Diagnosis not present

## 2021-06-05 DIAGNOSIS — M5136 Other intervertebral disc degeneration, lumbar region: Secondary | ICD-10-CM | POA: Diagnosis not present

## 2021-06-05 DIAGNOSIS — Z0000 Encounter for general adult medical examination without abnormal findings: Secondary | ICD-10-CM | POA: Diagnosis not present

## 2021-06-05 DIAGNOSIS — Z6832 Body mass index (BMI) 32.0-32.9, adult: Secondary | ICD-10-CM | POA: Diagnosis not present

## 2021-06-05 DIAGNOSIS — R030 Elevated blood-pressure reading, without diagnosis of hypertension: Secondary | ICD-10-CM | POA: Diagnosis not present

## 2021-06-05 DIAGNOSIS — G894 Chronic pain syndrome: Secondary | ICD-10-CM | POA: Diagnosis not present

## 2021-06-14 DIAGNOSIS — M549 Dorsalgia, unspecified: Secondary | ICD-10-CM | POA: Diagnosis not present

## 2021-06-14 DIAGNOSIS — I10 Essential (primary) hypertension: Secondary | ICD-10-CM | POA: Diagnosis not present

## 2021-06-15 ENCOUNTER — Emergency Department (HOSPITAL_COMMUNITY)
Admission: EM | Admit: 2021-06-15 | Discharge: 2021-06-15 | Discharge status: Against Medical Advice | Payer: Medicare HMO | Source: Home / Self Care

## 2021-06-19 DIAGNOSIS — R030 Elevated blood-pressure reading, without diagnosis of hypertension: Secondary | ICD-10-CM | POA: Diagnosis not present

## 2021-06-19 DIAGNOSIS — R635 Abnormal weight gain: Secondary | ICD-10-CM | POA: Diagnosis not present

## 2021-06-19 DIAGNOSIS — Z6832 Body mass index (BMI) 32.0-32.9, adult: Secondary | ICD-10-CM | POA: Diagnosis not present

## 2021-06-19 DIAGNOSIS — R0602 Shortness of breath: Secondary | ICD-10-CM | POA: Diagnosis not present

## 2021-06-19 DIAGNOSIS — R5383 Other fatigue: Secondary | ICD-10-CM | POA: Diagnosis not present

## 2021-06-25 ENCOUNTER — Emergency Department (HOSPITAL_COMMUNITY): Payer: Medicare HMO

## 2021-06-25 ENCOUNTER — Emergency Department (HOSPITAL_COMMUNITY)
Admission: EM | Admit: 2021-06-25 | Discharge: 2021-06-25 | Discharge status: Against Medical Advice | Payer: Medicare HMO | Attending: Emergency Medicine | Admitting: Emergency Medicine

## 2021-06-25 ENCOUNTER — Other Ambulatory Visit: Payer: Self-pay

## 2021-06-25 DIAGNOSIS — Z87891 Personal history of nicotine dependence: Secondary | ICD-10-CM | POA: Diagnosis not present

## 2021-06-25 DIAGNOSIS — Z79899 Other long term (current) drug therapy: Secondary | ICD-10-CM | POA: Diagnosis not present

## 2021-06-25 DIAGNOSIS — R079 Chest pain, unspecified: Secondary | ICD-10-CM | POA: Diagnosis not present

## 2021-06-25 DIAGNOSIS — J449 Chronic obstructive pulmonary disease, unspecified: Secondary | ICD-10-CM | POA: Diagnosis not present

## 2021-06-25 DIAGNOSIS — R072 Precordial pain: Secondary | ICD-10-CM | POA: Diagnosis not present

## 2021-06-25 DIAGNOSIS — R0789 Other chest pain: Secondary | ICD-10-CM | POA: Diagnosis not present

## 2021-06-25 DIAGNOSIS — I10 Essential (primary) hypertension: Secondary | ICD-10-CM | POA: Insufficient documentation

## 2021-06-25 LAB — CBC WITH DIFFERENTIAL/PLATELET
Abs Immature Granulocytes: 0.04 10*3/uL (ref 0.00–0.07)
Basophils Absolute: 0 10*3/uL (ref 0.0–0.1)
Basophils Relative: 0 %
Eosinophils Absolute: 0.1 10*3/uL (ref 0.0–0.5)
Eosinophils Relative: 1 %
HCT: 41.3 % (ref 39.0–52.0)
Hemoglobin: 14.4 g/dL (ref 13.0–17.0)
Immature Granulocytes: 0 %
Lymphocytes Relative: 29 %
Lymphs Abs: 3.4 10*3/uL (ref 0.7–4.0)
MCH: 30.4 pg (ref 26.0–34.0)
MCHC: 34.9 g/dL (ref 30.0–36.0)
MCV: 87.1 fL (ref 80.0–100.0)
Monocytes Absolute: 0.9 10*3/uL (ref 0.1–1.0)
Monocytes Relative: 7 %
Neutro Abs: 7.4 10*3/uL (ref 1.7–7.7)
Neutrophils Relative %: 63 %
Platelets: 307 10*3/uL (ref 150–400)
RBC: 4.74 MIL/uL (ref 4.22–5.81)
RDW: 11.4 % — ABNORMAL LOW (ref 11.5–15.5)
WBC: 11.9 10*3/uL — ABNORMAL HIGH (ref 4.0–10.5)
nRBC: 0 % (ref 0.0–0.2)

## 2021-06-25 LAB — COMPREHENSIVE METABOLIC PANEL
ALT: 17 U/L (ref 0–44)
AST: 14 U/L — ABNORMAL LOW (ref 15–41)
Albumin: 4.4 g/dL (ref 3.5–5.0)
Alkaline Phosphatase: 74 U/L (ref 38–126)
Anion gap: 10 (ref 5–15)
BUN: 13 mg/dL (ref 6–20)
CO2: 27 mmol/L (ref 22–32)
Calcium: 9.5 mg/dL (ref 8.9–10.3)
Chloride: 96 mmol/L — ABNORMAL LOW (ref 98–111)
Creatinine, Ser: 0.96 mg/dL (ref 0.61–1.24)
GFR, Estimated: >60 mL/min (ref >60)
Glucose, Bld: 90 mg/dL (ref 70–99)
Potassium: 4.6 mmol/L (ref 3.5–5.1)
Sodium: 133 mmol/L — ABNORMAL LOW (ref 135–145)
Total Bilirubin: 0.5 mg/dL (ref 0.3–1.2)
Total Protein: 6.8 g/dL (ref 6.5–8.1)

## 2021-06-25 LAB — TROPONIN I (HIGH SENSITIVITY): Troponin I (High Sensitivity): 2 ng/L (ref <18)

## 2021-06-25 LAB — LIPASE, BLOOD: Lipase: 21 U/L (ref 11–51)

## 2021-06-25 MED ORDER — ALUM & MAG HYDROXIDE-SIMETH 200-200-20 MG/5ML PO SUSP
30.0000 mL | Freq: Once | ORAL | Status: AC
  Administered 2021-06-25: 30 mL via ORAL
  Filled 2021-06-25: qty 30
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
## 2021-06-25 NOTE — ED Notes (Signed)
Pt was tired of waiting and said he could not sit here all night. As I was attempting to explain the importance of the follow up with the MD he got up and walked out.

## 2021-07-01 DIAGNOSIS — L258 Unspecified contact dermatitis due to other agents: Secondary | ICD-10-CM | POA: Diagnosis not present

## 2021-07-01 DIAGNOSIS — F319 Bipolar disorder, unspecified: Secondary | ICD-10-CM | POA: Diagnosis not present

## 2021-07-02 DIAGNOSIS — R030 Elevated blood-pressure reading, without diagnosis of hypertension: Secondary | ICD-10-CM | POA: Diagnosis not present

## 2021-07-02 DIAGNOSIS — Z79899 Other long term (current) drug therapy: Secondary | ICD-10-CM | POA: Diagnosis not present

## 2021-07-02 DIAGNOSIS — Z6832 Body mass index (BMI) 32.0-32.9, adult: Secondary | ICD-10-CM | POA: Diagnosis not present

## 2021-07-02 DIAGNOSIS — M5136 Other intervertebral disc degeneration, lumbar region: Secondary | ICD-10-CM | POA: Diagnosis not present

## 2021-07-02 DIAGNOSIS — G894 Chronic pain syndrome: Secondary | ICD-10-CM | POA: Diagnosis not present

## 2021-07-14 DIAGNOSIS — I10 Essential (primary) hypertension: Secondary | ICD-10-CM | POA: Diagnosis not present

## 2021-07-14 DIAGNOSIS — M549 Dorsalgia, unspecified: Secondary | ICD-10-CM | POA: Diagnosis not present

## 2021-07-20 DIAGNOSIS — T8484XA Pain due to internal orthopedic prosthetic devices, implants and grafts, initial encounter: Secondary | ICD-10-CM | POA: Diagnosis not present

## 2021-07-20 DIAGNOSIS — M19171 Post-traumatic osteoarthritis, right ankle and foot: Secondary | ICD-10-CM | POA: Diagnosis not present

## 2021-07-20 DIAGNOSIS — M25571 Pain in right ankle and joints of right foot: Secondary | ICD-10-CM | POA: Diagnosis not present

## 2021-07-30 DIAGNOSIS — R030 Elevated blood-pressure reading, without diagnosis of hypertension: Secondary | ICD-10-CM | POA: Diagnosis not present

## 2021-07-30 DIAGNOSIS — M5136 Other intervertebral disc degeneration, lumbar region: Secondary | ICD-10-CM | POA: Diagnosis not present

## 2021-07-30 DIAGNOSIS — Z6832 Body mass index (BMI) 32.0-32.9, adult: Secondary | ICD-10-CM | POA: Diagnosis not present

## 2021-07-30 DIAGNOSIS — Z79899 Other long term (current) drug therapy: Secondary | ICD-10-CM | POA: Diagnosis not present

## 2021-07-30 DIAGNOSIS — G894 Chronic pain syndrome: Secondary | ICD-10-CM | POA: Diagnosis not present

## 2021-08-04 DIAGNOSIS — Z79899 Other long term (current) drug therapy: Secondary | ICD-10-CM | POA: Diagnosis not present

## 2021-08-14 DIAGNOSIS — I10 Essential (primary) hypertension: Secondary | ICD-10-CM | POA: Diagnosis not present

## 2021-08-14 DIAGNOSIS — M549 Dorsalgia, unspecified: Secondary | ICD-10-CM | POA: Diagnosis not present

## 2021-08-26 ENCOUNTER — Ambulatory Visit (HOSPITAL_COMMUNITY): Payer: Medicare HMO | Admitting: Physical Therapy

## 2021-08-26 DIAGNOSIS — M79604 Pain in right leg: Secondary | ICD-10-CM | POA: Diagnosis not present

## 2021-08-26 DIAGNOSIS — Z6832 Body mass index (BMI) 32.0-32.9, adult: Secondary | ICD-10-CM | POA: Diagnosis not present

## 2021-08-26 DIAGNOSIS — E559 Vitamin D deficiency, unspecified: Secondary | ICD-10-CM | POA: Diagnosis not present

## 2021-08-26 DIAGNOSIS — R030 Elevated blood-pressure reading, without diagnosis of hypertension: Secondary | ICD-10-CM | POA: Diagnosis not present

## 2021-08-26 DIAGNOSIS — G894 Chronic pain syndrome: Secondary | ICD-10-CM | POA: Diagnosis not present

## 2021-08-26 DIAGNOSIS — M545 Low back pain, unspecified: Secondary | ICD-10-CM | POA: Diagnosis not present

## 2021-08-26 DIAGNOSIS — F1120 Opioid dependence, uncomplicated: Secondary | ICD-10-CM | POA: Diagnosis not present

## 2021-08-26 DIAGNOSIS — Z79899 Other long term (current) drug therapy: Secondary | ICD-10-CM | POA: Diagnosis not present

## 2021-08-26 DIAGNOSIS — M5136 Other intervertebral disc degeneration, lumbar region: Secondary | ICD-10-CM | POA: Diagnosis not present

## 2021-08-27 ENCOUNTER — Encounter (HOSPITAL_COMMUNITY): Payer: Self-pay | Admitting: Physical Therapy

## 2021-08-27 ENCOUNTER — Other Ambulatory Visit: Payer: Self-pay

## 2021-08-27 ENCOUNTER — Ambulatory Visit (HOSPITAL_COMMUNITY): Payer: Medicare HMO | Attending: Orthopedic Surgery | Admitting: Physical Therapy

## 2021-08-27 DIAGNOSIS — R2689 Other abnormalities of gait and mobility: Secondary | ICD-10-CM | POA: Insufficient documentation

## 2021-08-27 DIAGNOSIS — M25571 Pain in right ankle and joints of right foot: Secondary | ICD-10-CM | POA: Insufficient documentation

## 2021-08-27 DIAGNOSIS — R29898 Other symptoms and signs involving the musculoskeletal system: Secondary | ICD-10-CM | POA: Diagnosis not present

## 2021-08-27 DIAGNOSIS — M6281 Muscle weakness (generalized): Secondary | ICD-10-CM | POA: Insufficient documentation
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
## 2021-08-27 NOTE — Patient Instructions (Signed)
Access Code: [email or web address] URL: [email or web address] Date: 08/27/2021 Prepared by: [facility] Medical illustrator on [employer] (Mirrored) - 3 x daily - 7 x weekly - 3 reps - 20 second hold Seated Ankle Circles - 3 x daily - 7 x weekly - 20 reps Single Leg Stance (Mirrored) - 3 x daily - 7 x weekly - 3 reps - 30 second hold

## 2021-09-03 DIAGNOSIS — F319 Bipolar disorder, unspecified: Secondary | ICD-10-CM | POA: Diagnosis not present

## 2021-09-04 ENCOUNTER — Ambulatory Visit (HOSPITAL_COMMUNITY): Payer: Medicare HMO | Attending: Orthopedic Surgery

## 2021-09-04 ENCOUNTER — Encounter (HOSPITAL_COMMUNITY): Payer: Self-pay

## 2021-09-04 ENCOUNTER — Other Ambulatory Visit: Payer: Self-pay

## 2021-09-04 DIAGNOSIS — R29898 Other symptoms and signs involving the musculoskeletal system: Secondary | ICD-10-CM | POA: Diagnosis not present

## 2021-09-04 DIAGNOSIS — M25571 Pain in right ankle and joints of right foot: Secondary | ICD-10-CM | POA: Insufficient documentation

## 2021-09-04 DIAGNOSIS — R2689 Other abnormalities of gait and mobility: Secondary | ICD-10-CM | POA: Insufficient documentation

## 2021-09-04 DIAGNOSIS — M6281 Muscle weakness (generalized): Secondary | ICD-10-CM | POA: Insufficient documentation
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
## 2021-09-04 NOTE — Patient Instructions (Signed)
Tandem Stance    Right foot in front of left, heel touching toe both feet "straight ahead". Stand on Foot Triangle of Support with both feet. Balance in this position 30 seconds. Do with left foot in front of right.  Copyright  VHI. All rights reserved.   

## 2021-09-08 ENCOUNTER — Other Ambulatory Visit: Payer: Self-pay

## 2021-09-08 ENCOUNTER — Ambulatory Visit (HOSPITAL_COMMUNITY): Payer: Medicare HMO | Admitting: Physical Therapy

## 2021-09-08 DIAGNOSIS — R29898 Other symptoms and signs involving the musculoskeletal system: Secondary | ICD-10-CM | POA: Diagnosis not present

## 2021-09-08 DIAGNOSIS — M25571 Pain in right ankle and joints of right foot: Secondary | ICD-10-CM | POA: Diagnosis not present

## 2021-09-08 DIAGNOSIS — R2689 Other abnormalities of gait and mobility: Secondary | ICD-10-CM | POA: Diagnosis not present

## 2021-09-08 DIAGNOSIS — M6281 Muscle weakness (generalized): Secondary | ICD-10-CM | POA: Diagnosis not present
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
## 2021-09-10 ENCOUNTER — Encounter (HOSPITAL_COMMUNITY): Payer: Medicare HMO | Admitting: Physical Therapy

## 2021-09-13 ENCOUNTER — Emergency Department (HOSPITAL_COMMUNITY)
Admission: EM | Admit: 2021-09-13 | Discharge: 2021-09-13 | Disposition: A | Discharge status: Home / Self Care | Payer: Medicare HMO | Attending: Emergency Medicine | Admitting: Emergency Medicine

## 2021-09-13 ENCOUNTER — Other Ambulatory Visit: Payer: Self-pay

## 2021-09-13 ENCOUNTER — Encounter (HOSPITAL_COMMUNITY): Payer: Self-pay | Admitting: *Deleted

## 2021-09-13 DIAGNOSIS — K0889 Other specified disorders of teeth and supporting structures: Secondary | ICD-10-CM | POA: Diagnosis not present

## 2021-09-13 DIAGNOSIS — J449 Chronic obstructive pulmonary disease, unspecified: Secondary | ICD-10-CM | POA: Diagnosis not present

## 2021-09-13 DIAGNOSIS — Z79899 Other long term (current) drug therapy: Secondary | ICD-10-CM | POA: Insufficient documentation

## 2021-09-13 DIAGNOSIS — I10 Essential (primary) hypertension: Secondary | ICD-10-CM | POA: Diagnosis not present

## 2021-09-13 MED ORDER — PENICILLIN V POTASSIUM 500 MG PO TABS: 500.0000 mg | ORAL_TABLET | Freq: Four times a day (QID) | ORAL | 0 refills | Status: AC

## 2021-09-13 NOTE — ED Provider Notes (Signed)
[facility] Provider Note   CSN: [id number] Arrival date & time: 09/13/21  2706     History  Chief Complaint  Patient presents with   Oral Pain    [name] [name] is a 56 y.o. male.  Patient with the complaint of left lower molar area gum pain and tooth pain.  Hurts when he bites down on the tooth.  Patient states that he had a root canal in that tooth.  Patient has follow-up with his dentist but has not for several days.  He has been having discomfort in that tooth for several days.   Past medical history is significant for chronic pain bipolar disorder COPD hypertension.  Patient is under pain management.  Currently on methadone.      Home Medications Prior to Admission medications   Medication Sig Start Date End Date Taking? Authorizing Provider  penicillin v potassium (VEETID) 500 MG tablet Take 1 tablet (500 mg total) by mouth 4 (four) times daily for 7 days. 09/13/21 09/20/21 Yes Fredia Sorrow, MD  amoxicillin-clavulanate (AUGMENTIN) 875-125 MG tablet Take 1 tablet by mouth 2 (two) times daily. Patient not taking: Reported on 06/25/2021 04/04/21   Aviva Signs, MD  baclofen (LIORESAL) 20 MG tablet Take 20 mg by mouth 3 (three) times daily as needed. Patient not taking: Reported on 06/25/2021 03/11/21   [provider]  benztropine (COGENTIN) 0.5 MG tablet Take 1 tablet (0.5 mg total) by mouth 2 (two) times daily. Patient not taking: Reported on 06/25/2021 09/22/16   Orpah Greek, MD  docusate sodium (COLACE) 100 MG capsule Take 400 mg by mouth daily.    [provider]  ibuprofen (ADVIL) 200 MG tablet Take 800 mg by mouth every 6 (six) hours as needed.    [provider]  ibuprofen (ADVIL) 800 MG tablet Take 800 mg by mouth 3 (three) times daily. 06/05/21   [provider]  methadone (DOLOPHINE) 10 MG tablet Take 10 mg by mouth in the morning, at noon, and at bedtime. Juanda Crumble- PA    [provider]  naloxone [facility]) nasal spray 4 mg/0.1 mL SMARTSIG:Spray(s) Both Nares 10/17/20   [provider]  oxyCODONE-acetaminophen (PERCOCET) 10-325 MG tablet Take 1 tablet by mouth every 4 (four) hours as needed for pain.    [provider]  oxyCODONE-acetaminophen (PERCOCET) 5-325 MG tablet Take 1 tablet by mouth every 4 (four) hours as needed for severe pain. Patient not taking: Reported on 06/25/2021 04/04/21 04/04/22  Aviva Signs, MD  pantoprazole (PROTONIX) 40 MG tablet Take 1 tablet (40 mg total) by mouth daily before breakfast. 05/29/21   Mahala Menghini, PA-C  phentermine (ADIPEX-P) 37.5 MG tablet Take 37.5 mg by mouth daily. 06/19/21   [provider]  pregabalin (LYRICA) 100 MG capsule Take 100 mg by mouth 3 (three) times daily. 03/11/21   [provider]  risperiDONE (RISPERDAL) 0.5 MG tablet Take 1 tablet by mouth daily. Dinner time 09/23/18   [provider]  simvastatin (ZOCOR) 40 MG tablet Take 40 mg by mouth at bedtime. 12/02/16   [provider]  STIOLTO RESPIMAT 2.5-2.5 MCG/ACT AERS Inhale 2 puffs into the lungs daily.  Patient not taking: No sig reported 09/13/19   [provider]  telmisartan (MICARDIS) 80 MG tablet Take 1 tablet (80 mg total) by mouth daily. 10/11/19   Tanda Rockers, MD  traZODone (DESYREL) 100 MG tablet Take 1 tablet (100 mg total) by mouth at bedtime. 09/30/15  Kerrie Buffalo, NP  VENTOLIN HFA 108 (90 Base) MCG/ACT inhaler Inhale 1 puff into the lungs every 6 (six) hours as needed for shortness of breath (for shortness of breath/wheezing.).  Patient not taking: Reported on 04/04/2021 09/13/16   [provider]      Allergies    Ibuprofen    Review of Systems   Review of Systems  Constitutional:  Negative for chills and fever.  HENT:  Positive for dental problem. Negative for ear pain and sore throat.   Eyes:  Negative for pain and visual disturbance.  Respiratory:  Negative for  cough and shortness of breath.   Cardiovascular:  Negative for chest pain and palpitations.  Gastrointestinal:  Negative for abdominal pain and vomiting.  Genitourinary:  Negative for dysuria and hematuria.  Musculoskeletal:  Negative for arthralgias and back pain.  Skin:  Negative for color change and rash.  Neurological:  Negative for seizures and syncope.  All other systems reviewed and are negative.  Physical Exam Updated Vital Signs BP 112/73    Pulse 93    Temp 97.9 F (36.6 C) (Oral)    Resp 16    Ht 1.702 m (5\' 7" )    Wt 90.7 kg    SpO2 97%    BMI 31.32 kg/m  Physical Exam Vitals and nursing note reviewed.  Constitutional:      General: He is not in acute distress.    Appearance: Normal appearance. He is well-developed.  HENT:     Head: Normocephalic and atraumatic.     Comments: No facial or jaw swelling    Mouth/Throat:     Mouth: Mucous membranes are moist.     Comments: Erythema along the gumline left lower molar area.  No evidence of any foreign body.  No evidence of tooth avulsion or any significant decay. Eyes:     Extraocular Movements: Extraocular movements intact.     Conjunctiva/sclera: Conjunctivae normal.     Pupils: Pupils are equal, round, and reactive to light.  Cardiovascular:     Rate and Rhythm: Normal rate and regular rhythm.     Heart sounds: No murmur heard. Pulmonary:     Effort: No respiratory distress.  Abdominal:     Palpations: Abdomen is soft.     Tenderness: There is no abdominal tenderness.  Musculoskeletal:     Cervical back: Normal range of motion and neck supple.  Lymphadenopathy:     Cervical: No cervical adenopathy.  Skin:    General: Skin is warm and dry.     Capillary Refill: Capillary refill takes less than 2 seconds.  Neurological:     General: No focal deficit present.     Mental Status: He is alert and oriented to person, place, and time.    ED Results / Procedures / Treatments   Labs (all labs ordered are listed, but  only abnormal results are displayed) Labs Reviewed - No data to display  EKG None  Radiology No results found.  Procedures Procedures    Medications Ordered in ED Medications - No data to display  ED Course/ Medical Decision Making/ A&P                           Medical Decision Making Risk Prescription drug management.   Patient with some tooth pain left lower molar.  Also some erythema to the gum.  Will start on penicillin VK.  Patient is already under chronic pain management on  methadone.  Patient has follow-up with dentist.   Final Clinical Impression(s) / ED Diagnoses Final diagnoses:  Pain, dental    Rx / DC Orders ED Discharge Orders          Ordered    penicillin v potassium (VEETID) 500 MG tablet  4 times daily        09/13/21 0853              Fredia Sorrow, MD 09/13/21 [phone]

## 2021-09-13 NOTE — Discharge Instructions (Signed)
Keep your appointment with the your dentist.  Take the antibiotic as directed for the next 7 days.  Should settle down the pain and gum irritation.  Return for any new or worse symptoms.

## 2021-09-13 NOTE — ED Notes (Signed)
Pt c/o of pain on left lower last tooth at gum line x 3 days.

## 2021-09-13 NOTE — ED Triage Notes (Signed)
Pt c/o left lower gum pain x several days. Pt reports he feels like something is stuck in his gum.

## 2021-09-15 ENCOUNTER — Ambulatory Visit (HOSPITAL_COMMUNITY): Payer: Medicare HMO

## 2021-09-15 ENCOUNTER — Encounter (HOSPITAL_COMMUNITY): Payer: Self-pay

## 2021-09-15 ENCOUNTER — Other Ambulatory Visit: Payer: Self-pay

## 2021-09-15 DIAGNOSIS — R2689 Other abnormalities of gait and mobility: Secondary | ICD-10-CM

## 2021-09-15 DIAGNOSIS — R29898 Other symptoms and signs involving the musculoskeletal system: Secondary | ICD-10-CM | POA: Diagnosis not present

## 2021-09-15 DIAGNOSIS — M25571 Pain in right ankle and joints of right foot: Secondary | ICD-10-CM

## 2021-09-15 DIAGNOSIS — M6281 Muscle weakness (generalized): Secondary | ICD-10-CM | POA: Diagnosis not present
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
## 2021-09-16 ENCOUNTER — Emergency Department (HOSPITAL_COMMUNITY)
Admission: EM | Admit: 2021-09-16 | Discharge: 2021-09-16 | Discharge status: Against Medical Advice | Payer: Medicare HMO | Source: Home / Self Care

## 2021-09-17 ENCOUNTER — Encounter (HOSPITAL_COMMUNITY): Payer: Medicare HMO | Admitting: Physical Therapy

## 2021-09-22 ENCOUNTER — Telehealth (HOSPITAL_COMMUNITY): Payer: Self-pay | Admitting: Physical Therapy

## 2021-09-22 ENCOUNTER — Ambulatory Visit (HOSPITAL_COMMUNITY): Payer: Medicare HMO | Admitting: Physical Therapy

## 2021-09-22 ENCOUNTER — Encounter (HOSPITAL_COMMUNITY): Payer: Self-pay | Admitting: Physical Therapy

## 2021-09-22 DIAGNOSIS — J410 Simple chronic bronchitis: Secondary | ICD-10-CM | POA: Diagnosis not present

## 2021-09-22 DIAGNOSIS — K219 Gastro-esophageal reflux disease without esophagitis: Secondary | ICD-10-CM | POA: Diagnosis not present

## 2021-09-22 DIAGNOSIS — I10 Essential (primary) hypertension: Secondary | ICD-10-CM | POA: Diagnosis not present

## 2021-09-22 DIAGNOSIS — F3340 Major depressive disorder, recurrent, in remission, unspecified: Secondary | ICD-10-CM | POA: Diagnosis not present

## 2021-09-22 NOTE — Therapy (Signed)
[place], [place], [address] Phone: [phone]   Fax:  [phone]  Patient Details  Name: Kelsey Edman MRN: [id number] Date of Birth: 05-01-1966 Referring Provider:  No ref. provider found  Encounter Date: 09/22/2021  PHYSICAL THERAPY DISCHARGE SUMMARY  Visits from Start of Care: 4  Current functional level related to goals / functional outcomes: Patient has not returned   Remaining deficits: Patient has not returned   Education / Equipment: HEP   Patient agrees to discharge. Patient goals were not met. Patient is being discharged due to not returning since the last visit.   10:16 AM, 09/22/21 Mearl Latin PT, DPT Physical Therapist at [place], [place], [address] Phone: [phone]   Fax:  [phone]

## 2021-09-22 NOTE — Telephone Encounter (Signed)
Patient no show for appointment today. Spoke with patient regarding missed appointment and he stated he did not want to return. Cancelled his remaining appointments and will d/c from PT.   10:14 AM, 09/22/21 Mearl Latin PT, DPT Physical Therapist at [facility]

## 2021-09-23 DIAGNOSIS — M5136 Other intervertebral disc degeneration, lumbar region: Secondary | ICD-10-CM | POA: Diagnosis not present

## 2021-09-23 DIAGNOSIS — E559 Vitamin D deficiency, unspecified: Secondary | ICD-10-CM | POA: Diagnosis not present

## 2021-09-23 DIAGNOSIS — Z6833 Body mass index (BMI) 33.0-33.9, adult: Secondary | ICD-10-CM | POA: Diagnosis not present

## 2021-09-23 DIAGNOSIS — R030 Elevated blood-pressure reading, without diagnosis of hypertension: Secondary | ICD-10-CM | POA: Diagnosis not present

## 2021-09-23 DIAGNOSIS — M545 Low back pain, unspecified: Secondary | ICD-10-CM | POA: Diagnosis not present

## 2021-09-23 DIAGNOSIS — Z79899 Other long term (current) drug therapy: Secondary | ICD-10-CM | POA: Diagnosis not present

## 2021-09-23 DIAGNOSIS — M79604 Pain in right leg: Secondary | ICD-10-CM | POA: Diagnosis not present

## 2021-09-23 DIAGNOSIS — G894 Chronic pain syndrome: Secondary | ICD-10-CM | POA: Diagnosis not present

## 2021-09-24 ENCOUNTER — Ambulatory Visit (HOSPITAL_COMMUNITY): Payer: Medicare HMO | Admitting: Physical Therapy

## 2021-10-20 DIAGNOSIS — E785 Hyperlipidemia, unspecified: Secondary | ICD-10-CM | POA: Diagnosis not present

## 2021-10-20 DIAGNOSIS — I10 Essential (primary) hypertension: Secondary | ICD-10-CM | POA: Diagnosis not present

## 2021-10-21 DIAGNOSIS — G894 Chronic pain syndrome: Secondary | ICD-10-CM | POA: Diagnosis not present

## 2021-10-21 DIAGNOSIS — Z6834 Body mass index (BMI) 34.0-34.9, adult: Secondary | ICD-10-CM | POA: Diagnosis not present

## 2021-10-21 DIAGNOSIS — R030 Elevated blood-pressure reading, without diagnosis of hypertension: Secondary | ICD-10-CM | POA: Diagnosis not present

## 2021-10-21 DIAGNOSIS — Z79899 Other long term (current) drug therapy: Secondary | ICD-10-CM | POA: Diagnosis not present

## 2021-10-21 DIAGNOSIS — M545 Low back pain, unspecified: Secondary | ICD-10-CM | POA: Diagnosis not present

## 2021-10-21 DIAGNOSIS — M5136 Other intervertebral disc degeneration, lumbar region: Secondary | ICD-10-CM | POA: Diagnosis not present

## 2021-10-21 DIAGNOSIS — M79604 Pain in right leg: Secondary | ICD-10-CM | POA: Diagnosis not present

## 2021-11-02 ENCOUNTER — Other Ambulatory Visit: Payer: Self-pay | Admitting: Gastroenterology

## 2021-11-18 DIAGNOSIS — M79604 Pain in right leg: Secondary | ICD-10-CM | POA: Diagnosis not present

## 2021-11-18 DIAGNOSIS — R030 Elevated blood-pressure reading, without diagnosis of hypertension: Secondary | ICD-10-CM | POA: Diagnosis not present

## 2021-11-18 DIAGNOSIS — Z79899 Other long term (current) drug therapy: Secondary | ICD-10-CM | POA: Diagnosis not present

## 2021-11-18 DIAGNOSIS — M5136 Other intervertebral disc degeneration, lumbar region: Secondary | ICD-10-CM | POA: Diagnosis not present

## 2021-11-18 DIAGNOSIS — M545 Low back pain, unspecified: Secondary | ICD-10-CM | POA: Diagnosis not present

## 2021-11-18 DIAGNOSIS — Z6834 Body mass index (BMI) 34.0-34.9, adult: Secondary | ICD-10-CM | POA: Diagnosis not present

## 2021-11-18 DIAGNOSIS — G894 Chronic pain syndrome: Secondary | ICD-10-CM | POA: Diagnosis not present

## 2021-11-20 DIAGNOSIS — I10 Essential (primary) hypertension: Secondary | ICD-10-CM | POA: Diagnosis not present

## 2021-11-20 DIAGNOSIS — J410 Simple chronic bronchitis: Secondary | ICD-10-CM | POA: Diagnosis not present

## 2021-11-23 DIAGNOSIS — Z79899 Other long term (current) drug therapy: Secondary | ICD-10-CM | POA: Diagnosis not present

## 2021-11-26 DIAGNOSIS — F319 Bipolar disorder, unspecified: Secondary | ICD-10-CM | POA: Diagnosis not present

## 2021-12-16 DIAGNOSIS — G894 Chronic pain syndrome: Secondary | ICD-10-CM | POA: Diagnosis not present

## 2021-12-16 DIAGNOSIS — M5136 Other intervertebral disc degeneration, lumbar region: Secondary | ICD-10-CM | POA: Diagnosis not present

## 2021-12-16 DIAGNOSIS — Z79899 Other long term (current) drug therapy: Secondary | ICD-10-CM | POA: Diagnosis not present

## 2021-12-16 DIAGNOSIS — M545 Low back pain, unspecified: Secondary | ICD-10-CM | POA: Diagnosis not present

## 2021-12-16 DIAGNOSIS — M79604 Pain in right leg: Secondary | ICD-10-CM | POA: Diagnosis not present

## 2021-12-16 DIAGNOSIS — R030 Elevated blood-pressure reading, without diagnosis of hypertension: Secondary | ICD-10-CM | POA: Diagnosis not present

## 2021-12-16 DIAGNOSIS — Z6833 Body mass index (BMI) 33.0-33.9, adult: Secondary | ICD-10-CM | POA: Diagnosis not present

## 2021-12-20 DIAGNOSIS — M549 Dorsalgia, unspecified: Secondary | ICD-10-CM | POA: Diagnosis not present

## 2021-12-20 DIAGNOSIS — I10 Essential (primary) hypertension: Secondary | ICD-10-CM | POA: Diagnosis not present

## 2021-12-21 ENCOUNTER — Other Ambulatory Visit: Payer: Self-pay

## 2021-12-21 ENCOUNTER — Emergency Department (HOSPITAL_COMMUNITY)
Admission: EM | Admit: 2021-12-21 | Discharge: 2021-12-21 | Disposition: A | Discharge status: Home / Self Care | Payer: Medicare HMO | Attending: Emergency Medicine | Admitting: Emergency Medicine

## 2021-12-21 ENCOUNTER — Encounter (HOSPITAL_COMMUNITY): Payer: Self-pay | Admitting: *Deleted

## 2021-12-21 DIAGNOSIS — F332 Major depressive disorder, recurrent severe without psychotic features: Secondary | ICD-10-CM | POA: Diagnosis not present

## 2021-12-21 DIAGNOSIS — Z79899 Other long term (current) drug therapy: Secondary | ICD-10-CM | POA: Insufficient documentation

## 2021-12-21 DIAGNOSIS — J449 Chronic obstructive pulmonary disease, unspecified: Secondary | ICD-10-CM | POA: Diagnosis not present

## 2021-12-21 DIAGNOSIS — R45851 Suicidal ideations: Secondary | ICD-10-CM | POA: Insufficient documentation

## 2021-12-21 DIAGNOSIS — F32A Depression, unspecified: Secondary | ICD-10-CM | POA: Diagnosis not present

## 2021-12-21 DIAGNOSIS — I10 Essential (primary) hypertension: Secondary | ICD-10-CM | POA: Insufficient documentation

## 2021-12-21 DIAGNOSIS — D72829 Elevated white blood cell count, unspecified: Secondary | ICD-10-CM | POA: Insufficient documentation

## 2021-12-21 DIAGNOSIS — Y900 Blood alcohol level of less than 20 mg/100 ml: Secondary | ICD-10-CM | POA: Diagnosis not present

## 2021-12-21 LAB — CBC
HCT: 46.3 % (ref 39.0–52.0)
Hemoglobin: 16 g/dL (ref 13.0–17.0)
MCH: 29.9 pg (ref 26.0–34.0)
MCHC: 34.6 g/dL (ref 30.0–36.0)
MCV: 86.4 fL (ref 80.0–100.0)
Platelets: 282 10*3/uL (ref 150–400)
RBC: 5.36 MIL/uL (ref 4.22–5.81)
RDW: 12.1 % (ref 11.5–15.5)
WBC: 14 10*3/uL — ABNORMAL HIGH (ref 4.0–10.5)
nRBC: 0 % (ref 0.0–0.2)

## 2021-12-21 LAB — RAPID URINE DRUG SCREEN, HOSP PERFORMED
Amphetamines: NOT DETECTED
Barbiturates: NOT DETECTED
Benzodiazepines: NOT DETECTED
Cocaine: NOT DETECTED
Opiates: NOT DETECTED
Tetrahydrocannabinol: NOT DETECTED

## 2021-12-21 LAB — COMPREHENSIVE METABOLIC PANEL
ALT: 17 U/L (ref 0–44)
AST: 21 U/L (ref 15–41)
Albumin: 4.6 g/dL (ref 3.5–5.0)
Alkaline Phosphatase: 57 U/L (ref 38–126)
Anion gap: 7 (ref 5–15)
BUN: 12 mg/dL (ref 6–20)
CO2: 26 mmol/L (ref 22–32)
Calcium: 9.5 mg/dL (ref 8.9–10.3)
Chloride: 103 mmol/L (ref 98–111)
Creatinine, Ser: 0.95 mg/dL (ref 0.61–1.24)
GFR, Estimated: >60 mL/min (ref >60)
Glucose, Bld: 131 mg/dL — ABNORMAL HIGH (ref 70–99)
Potassium: 3.9 mmol/L (ref 3.5–5.1)
Sodium: 136 mmol/L (ref 135–145)
Total Bilirubin: 0.9 mg/dL (ref 0.3–1.2)
Total Protein: 7.6 g/dL (ref 6.5–8.1)

## 2021-12-21 LAB — ETHANOL: Alcohol, Ethyl (B): <10 mg/dL (ref <10)

## 2021-12-21 LAB — ACETAMINOPHEN LEVEL: Acetaminophen (Tylenol), Serum: <10 ug/mL — ABNORMAL LOW (ref 10–30)

## 2021-12-21 LAB — SALICYLATE LEVEL: Salicylate Lvl: <7 mg/dL — ABNORMAL LOW (ref 7.0–30.0)

## 2021-12-21 MED ORDER — OXYMETAZOLINE HCL 0.05 % NA SOLN
1.0000 | Freq: Once | NASAL | Status: AC
Start: 2021-12-21 — End: 2021-12-21
  Administered 2021-12-21: 1 via NASAL
  Filled 2021-12-21: qty 30

## 2021-12-21 MED ORDER — METHADONE HCL 10 MG PO TABS
10.0000 mg | ORAL_TABLET | Freq: Once | ORAL | Status: AC
Start: 2021-12-21 — End: 2021-12-21
  Administered 2021-12-21: 10 mg via ORAL
  Filled 2021-12-21: qty 1

## 2021-12-21 NOTE — ED Provider Triage Note (Signed)
Emergency Medicine Provider Triage Evaluation Note  [name] [name] , a 56 y.o. male  was evaluated in triage.  Pt complains of depression and SI,. Hx of same. + previous hospitalization. Here voluntarily for help  Review of Systems  Positive: SI  Negative: HI  Physical Exam  BP (!) 126/95 (BP Location: Right Arm)   Pulse (!) 106   Temp 99.5 F (37.5 C) (Oral)   Resp 18   Ht '5\' 7"'$  (1.702 m)   Wt 95.3 kg   SpO2 91%   BMI 32.89 kg/m  Gen:   Awake, no distress   Resp:  Normal effort  MSK:   Moves extremities without difficulty  Other:    Medical Decision Making  Medically screening exam initiated at 12:42 PM.  Appropriate orders placed.  Shneur [place] was informed that the remainder of the evaluation will be completed by another provider, this initial triage assessment does not replace that evaluation, and the importance of remaining in the ED until their evaluation is complete.  Work up [employer], [place], PA-C 12/21/21 1243

## 2021-12-21 NOTE — ED Notes (Signed)
Pending TTS

## 2021-12-21 NOTE — ED Notes (Signed)
EDPA into room, at BS.  

## 2021-12-21 NOTE — ED Triage Notes (Signed)
Pt states he feels depressed and suicidal for the last few months; pt deals with chronic pain

## 2021-12-21 NOTE — ED Notes (Addendum)
C/o depression, and passive SI. Denies intent, denies plan, denies h/o attempt. Denies ETOH or drug use. Denies hallucinations. C/o chronic pain. Sees pain management and psychiatrist. Does not have therapist. Use to take wellbutrin, but he got better, and it did not really help. Has been thru ED [facility] process before. Denies questions or needs., Requested food and drink.
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
## 2021-12-21 NOTE — ED Provider Notes (Signed)
[facility] EMERGENCY DEPARTMENT Provider Note   CSN: [id number] Arrival date & time: 12/21/21  1129     History  Chief Complaint  Patient presents with   V70.1    Zaron Zwiefelhofer is a 56 y.o. male.  HPI     Jeyden Coffelt is a 56 y.o. male with past medical history of bipolar 1 disorder, depression, hypertension, COPD, and chronic pain who presents to the Emergency Department requesting evaluation of suicidal thoughts.  States he is depressed and having increasing thoughts of suicide.  Symptoms present for 2-3 months.  States he has limited mobility and lives with pain every day of his life.  He is unable to do the activities he likes to do.  He does have improvement of his pain with his prescribed pain medications.  He does not have a specific plan to harm himself.  He denies homicidal thoughts,  auditory or visual hallucinations.  Denies any illicit drug use.    Home Medications Prior to Admission medications   Medication Sig Start Date End Date Taking? Authorizing Provider  amoxicillin-clavulanate (AUGMENTIN) 875-125 MG tablet Take 1 tablet by mouth 2 (two) times daily. Patient not taking: Reported on 06/25/2021 04/04/21   Aviva Signs, MD  baclofen (LIORESAL) 20 MG tablet Take 20 mg by mouth 3 (three) times daily as needed. Patient not taking: Reported on 06/25/2021 03/11/21   [provider]  benztropine (COGENTIN) 0.5 MG tablet Take 1 tablet (0.5 mg total) by mouth 2 (two) times daily. Patient not taking: Reported on 06/25/2021 09/22/16   Orpah Greek, MD  docusate sodium (COLACE) 100 MG capsule Take 400 mg by mouth daily.    [provider]  ibuprofen (ADVIL) 200 MG tablet Take 800 mg by mouth every 6 (six) hours as needed.    [provider]  ibuprofen (ADVIL) 800 MG tablet Take 800 mg by mouth 3 (three) times daily. 06/05/21   [provider]  methadone (DOLOPHINE) 10 MG tablet Take 10 mg by mouth in the morning, at noon, and at  bedtime. Juanda Crumble- PA    [provider]  naloxone [facility]) nasal spray 4 mg/0.1 mL SMARTSIG:Spray(s) Both Nares 10/17/20   [provider]  oxyCODONE-acetaminophen (PERCOCET) 10-325 MG tablet Take 1 tablet by mouth every 4 (four) hours as needed for pain.    [provider]  oxyCODONE-acetaminophen (PERCOCET) 5-325 MG tablet Take 1 tablet by mouth every 4 (four) hours as needed for severe pain. Patient not taking: Reported on 06/25/2021 04/04/21 04/04/22  Aviva Signs, MD  pantoprazole (PROTONIX) 40 MG tablet TAKE ONE TABLET BY MOUTH ONCE DAILY. 11/02/21   Sherron Monday, NP  phentermine (ADIPEX-P) 37.5 MG tablet Take 37.5 mg by mouth daily. 06/19/21   [provider]  pregabalin (LYRICA) 100 MG capsule Take 100 mg by mouth 3 (three) times daily. 03/11/21   [provider]  risperiDONE (RISPERDAL) 0.5 MG tablet Take 1 tablet by mouth daily. Dinner time 09/23/18   [provider]  simvastatin (ZOCOR) 40 MG tablet Take 40 mg by mouth at bedtime. 12/02/16   [provider]  STIOLTO RESPIMAT 2.5-2.5 MCG/ACT AERS Inhale 2 puffs into the lungs daily.  Patient not taking: No sig reported 09/13/19   [provider]  telmisartan (MICARDIS) 80 MG tablet Take 1 tablet (80 mg total) by mouth daily. 10/11/19   Tanda Rockers, MD  traZODone (DESYREL) 100 MG tablet Take 1 tablet (100 mg total) by mouth at bedtime. 09/30/15  Kerrie Buffalo, NP  VENTOLIN HFA 108 (90 Base) MCG/ACT inhaler Inhale 1 puff into the lungs every 6 (six) hours as needed for shortness of breath (for shortness of breath/wheezing.).  Patient not taking: Reported on 04/04/2021 09/13/16   [provider]      Allergies    Ibuprofen    Review of Systems   Review of Systems  Constitutional:  Negative for appetite change and fever.  Respiratory:  Negative for shortness of breath.   Cardiovascular:  Negative for chest pain.  Gastrointestinal:  Negative for  abdominal pain, diarrhea, nausea and vomiting.  Genitourinary:  Negative for dysuria.  Musculoskeletal:  Positive for back pain.  Skin:  Negative for rash.  Psychiatric/Behavioral:  Positive for suicidal ideas. Negative for behavioral problems, confusion, hallucinations and sleep disturbance. The patient is not nervous/anxious.    Physical Exam Updated Vital Signs BP (!) 126/95 (BP Location: Right Arm)   Pulse (!) 106   Temp 99.5 F (37.5 C) (Oral)   Resp 18   Ht '5\' 7"'$  (1.702 m)   Wt 95.3 kg   SpO2 91%   BMI 32.89 kg/m  Physical Exam Vitals and nursing note reviewed.  Constitutional:      General: He is not in acute distress.    Appearance: Normal appearance. He is not ill-appearing or toxic-appearing.  HENT:     Head: Atraumatic.  Eyes:     Extraocular Movements: Extraocular movements intact.     Pupils: Pupils are equal, round, and reactive to light.  Cardiovascular:     Rate and Rhythm: Normal rate and regular rhythm.     Pulses: Normal pulses.  Pulmonary:     Effort: Pulmonary effort is normal.  Abdominal:     Palpations: Abdomen is soft.     Tenderness: There is no abdominal tenderness.  Musculoskeletal:     Right lower leg: No edema.     Left lower leg: No edema.  Skin:    General: Skin is warm.     Capillary Refill: Capillary refill takes less than 2 seconds.  Neurological:     General: No focal deficit present.     Mental Status: He is alert.     Sensory: No sensory deficit.     Motor: No weakness.  Psychiatric:        Mood and Affect: Mood normal.        Speech: Speech normal.        Behavior: Behavior normal. Behavior is cooperative.        Thought Content: Thought content is not paranoid. Thought content includes suicidal ideation. Thought content does not include homicidal ideation. Thought content does not include homicidal or suicidal plan.    ED Results / Procedures / Treatments   Labs (all labs ordered are listed, but only abnormal results are  displayed) Labs Reviewed  COMPREHENSIVE METABOLIC PANEL - Abnormal; Notable for the following components:      Result Value   Glucose, Bld 131 (*)    All other components within normal limits  SALICYLATE LEVEL - Abnormal; Notable for the following components:   Salicylate Lvl <8.7 (*)    All other components within normal limits  ACETAMINOPHEN LEVEL - Abnormal; Notable for the following components:   Acetaminophen (Tylenol), Serum <10 (*)    All other components within normal limits  CBC - Abnormal; Notable for the following components:   WBC 14.0 (*)    All other components within normal limits  ETHANOL  RAPID URINE  DRUG SCREEN, HOSP PERFORMED    EKG None  Radiology No results found.  Procedures Procedures    Medications Ordered in ED Medications  oxymetazoline (AFRIN) 0.05 % nasal spray 1 spray (1 spray Each Nare Given 12/21/21 1720)  methadone (DOLOPHINE) tablet 10 mg (10 mg Oral Given 12/21/21 1721)    ED Course/ Medical Decision Making/ A&P                           Medical Decision Making Patient here for evaluation of suicidal thoughts.  Symptoms been present for 2 to 3 months.  Gradually increasing.  Endorses having thoughts but no specific plan.  Denies any homicidal thoughts or hallucinations.  Trigger seems to be patient's chronic pain.  On exam, patient well-appearing nontoxic.  He is calm and cooperative.  Does not appear to be interacting with internal stimuli.  He is voluntary.  He was evaluated at behavioral health in 2017.  Feel that he has significant trigger.  If medically clear, will have TTS consult.  Amount and/or Complexity of Data Reviewed External Data Reviewed: notes.    Details: External records reviewed Labs: ordered.    Details: Labs interpreted by me, unremarkable UDS, salicylate and Tylenol level.  Ethanol less than 10.  Chemistries unremarkable, CBC shows white count of 14,000 no other abnormalities. Discussion of management or test  interpretation with external provider(s): Patient medically clear at this time.  Awaiting TTS consult.  He remains calm and cooperative.  Requesting meal tray in his maintenance dose of pain medication.  TTS consult completed.  Patient has been psych cleared.  He will be discharged and given outpatient resources.  He is agreeable to this plan.    Risk OTC drugs. Prescription drug management.           Final Clinical Impression(s) / ED Diagnoses Final diagnoses:  Depression, unspecified depression type  Suicidal thoughts    Rx / DC Orders ED Discharge Orders     None         [name], PA-C 12/22/21 1420    Godfrey Pick, MD 12/26/21 1020

## 2021-12-21 NOTE — Discharge Instructions (Addendum)
Call [facility] to arrange a follow-up appointment.  May also contact someone on the resource list provided.

## 2021-12-22 DIAGNOSIS — Z79899 Other long term (current) drug therapy: Secondary | ICD-10-CM | POA: Diagnosis not present

## 2022-01-14 DIAGNOSIS — M5136 Other intervertebral disc degeneration, lumbar region: Secondary | ICD-10-CM | POA: Diagnosis not present

## 2022-01-14 DIAGNOSIS — R030 Elevated blood-pressure reading, without diagnosis of hypertension: Secondary | ICD-10-CM | POA: Diagnosis not present

## 2022-01-14 DIAGNOSIS — Z79899 Other long term (current) drug therapy: Secondary | ICD-10-CM | POA: Diagnosis not present

## 2022-01-14 DIAGNOSIS — Z6834 Body mass index (BMI) 34.0-34.9, adult: Secondary | ICD-10-CM | POA: Diagnosis not present

## 2022-01-14 DIAGNOSIS — M79604 Pain in right leg: Secondary | ICD-10-CM | POA: Diagnosis not present

## 2022-01-14 DIAGNOSIS — G894 Chronic pain syndrome: Secondary | ICD-10-CM | POA: Diagnosis not present

## 2022-01-14 DIAGNOSIS — M545 Low back pain, unspecified: Secondary | ICD-10-CM | POA: Diagnosis not present

## 2022-01-16 DIAGNOSIS — Z79899 Other long term (current) drug therapy: Secondary | ICD-10-CM | POA: Diagnosis not present

## 2022-01-20 DIAGNOSIS — I10 Essential (primary) hypertension: Secondary | ICD-10-CM | POA: Diagnosis not present

## 2022-01-20 DIAGNOSIS — M549 Dorsalgia, unspecified: Secondary | ICD-10-CM | POA: Diagnosis not present

## 2022-02-12 DIAGNOSIS — Z79899 Other long term (current) drug therapy: Secondary | ICD-10-CM | POA: Diagnosis not present

## 2022-02-12 DIAGNOSIS — F1120 Opioid dependence, uncomplicated: Secondary | ICD-10-CM | POA: Diagnosis not present

## 2022-02-12 DIAGNOSIS — Z6833 Body mass index (BMI) 33.0-33.9, adult: Secondary | ICD-10-CM | POA: Diagnosis not present

## 2022-02-12 DIAGNOSIS — M545 Low back pain, unspecified: Secondary | ICD-10-CM | POA: Diagnosis not present

## 2022-02-12 DIAGNOSIS — R030 Elevated blood-pressure reading, without diagnosis of hypertension: Secondary | ICD-10-CM | POA: Diagnosis not present

## 2022-02-12 DIAGNOSIS — G894 Chronic pain syndrome: Secondary | ICD-10-CM | POA: Diagnosis not present

## 2022-02-12 DIAGNOSIS — M79604 Pain in right leg: Secondary | ICD-10-CM | POA: Diagnosis not present

## 2022-02-12 DIAGNOSIS — M5136 Other intervertebral disc degeneration, lumbar region: Secondary | ICD-10-CM | POA: Diagnosis not present

## 2022-02-12 DIAGNOSIS — J449 Chronic obstructive pulmonary disease, unspecified: Secondary | ICD-10-CM | POA: Diagnosis not present

## 2022-02-18 DIAGNOSIS — Z79899 Other long term (current) drug therapy: Secondary | ICD-10-CM | POA: Diagnosis not present

## 2022-02-19 DIAGNOSIS — M549 Dorsalgia, unspecified: Secondary | ICD-10-CM | POA: Diagnosis not present

## 2022-02-19 DIAGNOSIS — I10 Essential (primary) hypertension: Secondary | ICD-10-CM | POA: Diagnosis not present

## 2022-03-09 DIAGNOSIS — I10 Essential (primary) hypertension: Secondary | ICD-10-CM | POA: Diagnosis not present

## 2022-03-09 DIAGNOSIS — Z0001 Encounter for general adult medical examination with abnormal findings: Secondary | ICD-10-CM | POA: Diagnosis not present

## 2022-03-09 DIAGNOSIS — Z1389 Encounter for screening for other disorder: Secondary | ICD-10-CM | POA: Diagnosis not present

## 2022-03-09 DIAGNOSIS — G894 Chronic pain syndrome: Secondary | ICD-10-CM | POA: Diagnosis not present

## 2022-03-09 DIAGNOSIS — Z1331 Encounter for screening for depression: Secondary | ICD-10-CM | POA: Diagnosis not present

## 2022-03-09 DIAGNOSIS — E785 Hyperlipidemia, unspecified: Secondary | ICD-10-CM | POA: Diagnosis not present

## 2022-03-10 DIAGNOSIS — Z0001 Encounter for general adult medical examination with abnormal findings: Secondary | ICD-10-CM | POA: Diagnosis not present

## 2022-03-10 DIAGNOSIS — Z125 Encounter for screening for malignant neoplasm of prostate: Secondary | ICD-10-CM | POA: Diagnosis not present

## 2022-03-10 DIAGNOSIS — I10 Essential (primary) hypertension: Secondary | ICD-10-CM | POA: Diagnosis not present

## 2022-03-10 DIAGNOSIS — E785 Hyperlipidemia, unspecified: Secondary | ICD-10-CM | POA: Diagnosis not present

## 2022-03-11 ENCOUNTER — Encounter: Payer: Self-pay | Admitting: *Deleted

## 2022-03-11 ENCOUNTER — Ambulatory Visit: Payer: Self-pay | Admitting: *Deleted

## 2022-03-11 NOTE — Patient Outreach (Signed)
  Care Coordination   Initial Visit Note   03/11/2022  Name: [name] [name] MRN: [id number] DOB: 1966-05-05  [name] [name] is a 57 y.o. year old male who sees Fanta, Normajean Baxter, MD for primary care. I spoke with [place] by phone today.  What matters to the patients health and wellness today?  No Intervention Identified.    Goals Addressed   None     SDOH assessments and interventions completed:  Yes  SDOH Interventions Today    Flowsheet Row Most Recent Value  SDOH Interventions   Food Insecurity Interventions Intervention Not Indicated  Financial Strain Interventions Intervention Not Indicated  Housing Interventions Intervention Not Indicated  Physical Activity Interventions Patient Refused  Stress Interventions Intervention Not Indicated  Social Connections Interventions Patient Refused  Transportation Interventions Intervention Not Indicated        Care Coordination Interventions Activated:  Yes   Care Coordination Interventions:  Yes, provided.   Follow up plan: No further intervention required.   Encounter Outcome:  Pt. Visit Completed.   Nat Christen, BSW, MSW, LCSW  Licensed Education officer, [facility]  Mailing [place] [address] [place], [place] [address] Physical Address-[address] [place], [place] [address] Toll Free Main # [phone] Fax # [phone] Cell # [phone] [email or web address]

## 2022-03-11 NOTE — Patient Instructions (Signed)
Visit Information  Thank you for taking time to visit with me today. Please don't hesitate to contact me if I can be of assistance to you.   Please call the care guide team at [phone] if you need to cancel or reschedule your appointment.   If you are experiencing a Mental Health or Behavioral Health Crisis or need someone to talk to, please call the Suicide and Crisis Lifeline: 988 call the [place] National Suicide Prevention Lifeline: 1-800-273-8255 or TTY: 1-800-799-4 TTY (1-800-799-4889) to talk to a trained counselor call 1-800-273-TALK (toll free, 24 hour hotline) go to [facility] [address], [place] ([phone]) call the [place] Crisis Line: 800-939-9988 call 911  Patient verbalizes understanding of instructions and care plan provided today and agrees to view in MyChart. Active MyChart status and patient understanding of how to access instructions and care plan via MyChart confirmed with patient.     No further follow up required.  Areliz Rothman, BSW, MSW, LCSW  Licensed Clinical Social Worker  [facility] Care Management  System  Mailing Address-[address], [place], [place] [address] Physical Address-[address], [place], [place] [address] Toll Free Main # [phone] Fax # [phone] Cell # [phone] [email or web address]            

## 2022-03-15 DIAGNOSIS — J449 Chronic obstructive pulmonary disease, unspecified: Secondary | ICD-10-CM | POA: Diagnosis not present

## 2022-03-15 DIAGNOSIS — M5136 Other intervertebral disc degeneration, lumbar region: Secondary | ICD-10-CM | POA: Diagnosis not present

## 2022-03-15 DIAGNOSIS — Z6833 Body mass index (BMI) 33.0-33.9, adult: Secondary | ICD-10-CM | POA: Diagnosis not present

## 2022-03-15 DIAGNOSIS — F1120 Opioid dependence, uncomplicated: Secondary | ICD-10-CM | POA: Diagnosis not present

## 2022-03-15 DIAGNOSIS — M79604 Pain in right leg: Secondary | ICD-10-CM | POA: Diagnosis not present

## 2022-03-15 DIAGNOSIS — R030 Elevated blood-pressure reading, without diagnosis of hypertension: Secondary | ICD-10-CM | POA: Diagnosis not present

## 2022-03-15 DIAGNOSIS — Z79899 Other long term (current) drug therapy: Secondary | ICD-10-CM | POA: Diagnosis not present

## 2022-03-15 DIAGNOSIS — M545 Low back pain, unspecified: Secondary | ICD-10-CM | POA: Diagnosis not present

## 2022-03-15 DIAGNOSIS — G894 Chronic pain syndrome: Secondary | ICD-10-CM | POA: Diagnosis not present

## 2022-04-13 DIAGNOSIS — M545 Low back pain, unspecified: Secondary | ICD-10-CM | POA: Diagnosis not present

## 2022-04-13 DIAGNOSIS — J449 Chronic obstructive pulmonary disease, unspecified: Secondary | ICD-10-CM | POA: Diagnosis not present

## 2022-04-13 DIAGNOSIS — Z6829 Body mass index (BMI) 29.0-29.9, adult: Secondary | ICD-10-CM | POA: Diagnosis not present

## 2022-04-13 DIAGNOSIS — Z79899 Other long term (current) drug therapy: Secondary | ICD-10-CM | POA: Diagnosis not present

## 2022-04-13 DIAGNOSIS — M5136 Other intervertebral disc degeneration, lumbar region: Secondary | ICD-10-CM | POA: Diagnosis not present

## 2022-04-13 DIAGNOSIS — G894 Chronic pain syndrome: Secondary | ICD-10-CM | POA: Diagnosis not present

## 2022-04-13 DIAGNOSIS — M79604 Pain in right leg: Secondary | ICD-10-CM | POA: Diagnosis not present

## 2022-04-13 DIAGNOSIS — F1120 Opioid dependence, uncomplicated: Secondary | ICD-10-CM | POA: Diagnosis not present

## 2022-04-13 DIAGNOSIS — R030 Elevated blood-pressure reading, without diagnosis of hypertension: Secondary | ICD-10-CM | POA: Diagnosis not present

## 2022-04-19 DIAGNOSIS — I10 Essential (primary) hypertension: Secondary | ICD-10-CM | POA: Diagnosis not present

## 2022-04-19 DIAGNOSIS — F32A Depression, unspecified: Secondary | ICD-10-CM | POA: Diagnosis not present

## 2022-04-28 NOTE — Progress Notes (Signed)
No chief complaint on file.

## 2022-05-12 DIAGNOSIS — F1120 Opioid dependence, uncomplicated: Secondary | ICD-10-CM | POA: Diagnosis not present

## 2022-05-12 DIAGNOSIS — J449 Chronic obstructive pulmonary disease, unspecified: Secondary | ICD-10-CM | POA: Diagnosis not present

## 2022-05-12 DIAGNOSIS — M545 Low back pain, unspecified: Secondary | ICD-10-CM | POA: Diagnosis not present

## 2022-05-12 DIAGNOSIS — Z6828 Body mass index (BMI) 28.0-28.9, adult: Secondary | ICD-10-CM | POA: Diagnosis not present

## 2022-05-12 DIAGNOSIS — R030 Elevated blood-pressure reading, without diagnosis of hypertension: Secondary | ICD-10-CM | POA: Diagnosis not present

## 2022-05-12 DIAGNOSIS — Z79899 Other long term (current) drug therapy: Secondary | ICD-10-CM | POA: Diagnosis not present

## 2022-05-12 DIAGNOSIS — M5136 Other intervertebral disc degeneration, lumbar region: Secondary | ICD-10-CM | POA: Diagnosis not present

## 2022-05-12 DIAGNOSIS — G894 Chronic pain syndrome: Secondary | ICD-10-CM | POA: Diagnosis not present

## 2022-05-12 DIAGNOSIS — M79604 Pain in right leg: Secondary | ICD-10-CM | POA: Diagnosis not present

## 2022-05-14 DIAGNOSIS — Z79899 Other long term (current) drug therapy: Secondary | ICD-10-CM | POA: Diagnosis not present

## 2022-05-19 DIAGNOSIS — I10 Essential (primary) hypertension: Secondary | ICD-10-CM | POA: Diagnosis not present

## 2022-05-19 DIAGNOSIS — F3340 Major depressive disorder, recurrent, in remission, unspecified: Secondary | ICD-10-CM | POA: Diagnosis not present

## 2022-06-07 DIAGNOSIS — Z79899 Other long term (current) drug therapy: Secondary | ICD-10-CM | POA: Diagnosis not present

## 2022-06-07 DIAGNOSIS — G894 Chronic pain syndrome: Secondary | ICD-10-CM | POA: Diagnosis not present

## 2022-06-07 DIAGNOSIS — J449 Chronic obstructive pulmonary disease, unspecified: Secondary | ICD-10-CM | POA: Diagnosis not present

## 2022-06-07 DIAGNOSIS — M545 Low back pain, unspecified: Secondary | ICD-10-CM | POA: Diagnosis not present

## 2022-06-07 DIAGNOSIS — Z6828 Body mass index (BMI) 28.0-28.9, adult: Secondary | ICD-10-CM | POA: Diagnosis not present

## 2022-06-07 DIAGNOSIS — R030 Elevated blood-pressure reading, without diagnosis of hypertension: Secondary | ICD-10-CM | POA: Diagnosis not present

## 2022-06-07 DIAGNOSIS — M79604 Pain in right leg: Secondary | ICD-10-CM | POA: Diagnosis not present

## 2022-06-07 DIAGNOSIS — M5136 Other intervertebral disc degeneration, lumbar region: Secondary | ICD-10-CM | POA: Diagnosis not present

## 2022-06-07 DIAGNOSIS — F1120 Opioid dependence, uncomplicated: Secondary | ICD-10-CM | POA: Diagnosis not present

## 2022-06-10 DIAGNOSIS — Z79899 Other long term (current) drug therapy: Secondary | ICD-10-CM | POA: Diagnosis not present

## 2022-06-12 ENCOUNTER — Other Ambulatory Visit: Payer: Self-pay | Admitting: Gastroenterology

## 2022-06-12 DIAGNOSIS — I10 Essential (primary) hypertension: Secondary | ICD-10-CM | POA: Diagnosis not present

## 2022-06-12 DIAGNOSIS — M549 Dorsalgia, unspecified: Secondary | ICD-10-CM | POA: Diagnosis not present

## 2022-06-22 ENCOUNTER — Other Ambulatory Visit: Payer: Self-pay | Admitting: Gastroenterology

## 2022-07-06 DIAGNOSIS — F1120 Opioid dependence, uncomplicated: Secondary | ICD-10-CM | POA: Diagnosis not present

## 2022-07-06 DIAGNOSIS — M545 Low back pain, unspecified: Secondary | ICD-10-CM | POA: Diagnosis not present

## 2022-07-06 DIAGNOSIS — J449 Chronic obstructive pulmonary disease, unspecified: Secondary | ICD-10-CM | POA: Diagnosis not present

## 2022-07-06 DIAGNOSIS — Z79899 Other long term (current) drug therapy: Secondary | ICD-10-CM | POA: Diagnosis not present

## 2022-07-06 DIAGNOSIS — M5136 Other intervertebral disc degeneration, lumbar region: Secondary | ICD-10-CM | POA: Diagnosis not present

## 2022-07-06 DIAGNOSIS — R030 Elevated blood-pressure reading, without diagnosis of hypertension: Secondary | ICD-10-CM | POA: Diagnosis not present

## 2022-07-06 DIAGNOSIS — Z6830 Body mass index (BMI) 30.0-30.9, adult: Secondary | ICD-10-CM | POA: Diagnosis not present

## 2022-07-06 DIAGNOSIS — G894 Chronic pain syndrome: Secondary | ICD-10-CM | POA: Diagnosis not present

## 2022-07-06 DIAGNOSIS — M79604 Pain in right leg: Secondary | ICD-10-CM | POA: Diagnosis not present

## 2022-07-12 DIAGNOSIS — E785 Hyperlipidemia, unspecified: Secondary | ICD-10-CM | POA: Diagnosis not present

## 2022-07-12 DIAGNOSIS — F3340 Major depressive disorder, recurrent, in remission, unspecified: Secondary | ICD-10-CM | POA: Diagnosis not present

## 2022-08-04 DIAGNOSIS — Z6831 Body mass index (BMI) 31.0-31.9, adult: Secondary | ICD-10-CM | POA: Diagnosis not present

## 2022-08-04 DIAGNOSIS — Z0000 Encounter for general adult medical examination without abnormal findings: Secondary | ICD-10-CM | POA: Diagnosis not present

## 2022-08-04 DIAGNOSIS — J449 Chronic obstructive pulmonary disease, unspecified: Secondary | ICD-10-CM | POA: Diagnosis not present

## 2022-08-04 DIAGNOSIS — R030 Elevated blood-pressure reading, without diagnosis of hypertension: Secondary | ICD-10-CM | POA: Diagnosis not present

## 2022-08-04 DIAGNOSIS — F1120 Opioid dependence, uncomplicated: Secondary | ICD-10-CM | POA: Diagnosis not present

## 2022-08-04 DIAGNOSIS — Z79899 Other long term (current) drug therapy: Secondary | ICD-10-CM | POA: Diagnosis not present

## 2022-08-04 DIAGNOSIS — G894 Chronic pain syndrome: Secondary | ICD-10-CM | POA: Diagnosis not present

## 2022-08-04 DIAGNOSIS — M545 Low back pain, unspecified: Secondary | ICD-10-CM | POA: Diagnosis not present

## 2022-08-04 DIAGNOSIS — M79604 Pain in right leg: Secondary | ICD-10-CM | POA: Diagnosis not present

## 2022-08-04 DIAGNOSIS — M5136 Other intervertebral disc degeneration, lumbar region: Secondary | ICD-10-CM | POA: Diagnosis not present

## 2022-08-12 DIAGNOSIS — I10 Essential (primary) hypertension: Secondary | ICD-10-CM | POA: Diagnosis not present

## 2022-08-12 DIAGNOSIS — M549 Dorsalgia, unspecified: Secondary | ICD-10-CM | POA: Diagnosis not present

## 2022-08-24 ENCOUNTER — Emergency Department (HOSPITAL_COMMUNITY)
Admission: EM | Admit: 2022-08-24 | Discharge: 2022-08-25 | Disposition: A | Discharge status: Home / Self Care | Payer: Medicare HMO | Attending: Emergency Medicine | Admitting: Emergency Medicine

## 2022-08-24 ENCOUNTER — Other Ambulatory Visit: Payer: Self-pay

## 2022-08-24 ENCOUNTER — Encounter (HOSPITAL_COMMUNITY): Payer: Self-pay | Admitting: Emergency Medicine

## 2022-08-24 DIAGNOSIS — J449 Chronic obstructive pulmonary disease, unspecified: Secondary | ICD-10-CM | POA: Diagnosis not present

## 2022-08-24 DIAGNOSIS — F19151 Other psychoactive substance abuse with psychoactive substance-induced psychotic disorder with hallucinations: Secondary | ICD-10-CM | POA: Insufficient documentation

## 2022-08-24 DIAGNOSIS — Z79899 Other long term (current) drug therapy: Secondary | ICD-10-CM | POA: Insufficient documentation

## 2022-08-24 DIAGNOSIS — D72829 Elevated white blood cell count, unspecified: Secondary | ICD-10-CM | POA: Diagnosis not present

## 2022-08-24 DIAGNOSIS — Z87891 Personal history of nicotine dependence: Secondary | ICD-10-CM | POA: Diagnosis not present

## 2022-08-24 DIAGNOSIS — I10 Essential (primary) hypertension: Secondary | ICD-10-CM | POA: Diagnosis not present

## 2022-08-24 DIAGNOSIS — F19951 Other psychoactive substance use, unspecified with psychoactive substance-induced psychotic disorder with hallucinations: Secondary | ICD-10-CM | POA: Diagnosis present

## 2022-08-24 DIAGNOSIS — Z1152 Encounter for screening for COVID-19: Secondary | ICD-10-CM | POA: Insufficient documentation

## 2022-08-24 DIAGNOSIS — F22 Delusional disorders: Secondary | ICD-10-CM | POA: Diagnosis not present

## 2022-08-24 DIAGNOSIS — R000 Tachycardia, unspecified: Secondary | ICD-10-CM | POA: Diagnosis not present

## 2022-08-24 LAB — COMPREHENSIVE METABOLIC PANEL
ALT: 17 U/L (ref 0–44)
AST: 23 U/L (ref 15–41)
Albumin: 4.5 g/dL (ref 3.5–5.0)
Alkaline Phosphatase: 67 U/L (ref 38–126)
Anion gap: 10 (ref 5–15)
BUN: 26 mg/dL — ABNORMAL HIGH (ref 6–20)
CO2: 24 mmol/L (ref 22–32)
Calcium: 9.1 mg/dL (ref 8.9–10.3)
Chloride: 103 mmol/L (ref 98–111)
Creatinine, Ser: 1.11 mg/dL (ref 0.61–1.24)
GFR, Estimated: >60 mL/min (ref >60)
Glucose, Bld: 100 mg/dL — ABNORMAL HIGH (ref 70–99)
Potassium: 4.7 mmol/L (ref 3.5–5.1)
Sodium: 137 mmol/L (ref 135–145)
Total Bilirubin: 0.9 mg/dL (ref 0.3–1.2)
Total Protein: 7.7 g/dL (ref 6.5–8.1)

## 2022-08-24 LAB — URINALYSIS, ROUTINE W REFLEX MICROSCOPIC
Bacteria, UA: NONE SEEN
Bilirubin Urine: NEGATIVE
Glucose, UA: NEGATIVE mg/dL
Hgb urine dipstick: NEGATIVE
Ketones, ur: 80 mg/dL — AB
Leukocytes,Ua: NEGATIVE
Nitrite: NEGATIVE
Protein, ur: 30 mg/dL — AB
Specific Gravity, Urine: 1.021 (ref 1.005–1.030)
pH: 5 (ref 5.0–8.0)

## 2022-08-24 LAB — CBC WITH DIFFERENTIAL/PLATELET
Abs Immature Granulocytes: 0.12 10*3/uL — ABNORMAL HIGH (ref 0.00–0.07)
Basophils Absolute: 0.1 10*3/uL (ref 0.0–0.1)
Basophils Relative: 0 %
Eosinophils Absolute: 0 10*3/uL (ref 0.0–0.5)
Eosinophils Relative: 0 %
HCT: 49.1 % (ref 39.0–52.0)
Hemoglobin: 16.7 g/dL (ref 13.0–17.0)
Immature Granulocytes: 1 %
Lymphocytes Relative: 9 %
Lymphs Abs: 1.9 10*3/uL (ref 0.7–4.0)
MCH: 29.9 pg (ref 26.0–34.0)
MCHC: 34 g/dL (ref 30.0–36.0)
MCV: 88 fL (ref 80.0–100.0)
Monocytes Absolute: 1.3 10*3/uL — ABNORMAL HIGH (ref 0.1–1.0)
Monocytes Relative: 6 %
Neutro Abs: 17.5 10*3/uL — ABNORMAL HIGH (ref 1.7–7.7)
Neutrophils Relative %: 84 %
Platelets: 246 10*3/uL (ref 150–400)
RBC: 5.58 MIL/uL (ref 4.22–5.81)
RDW: 12 % (ref 11.5–15.5)
WBC: 20.9 10*3/uL — ABNORMAL HIGH (ref 4.0–10.5)
nRBC: 0 % (ref 0.0–0.2)

## 2022-08-24 LAB — RESP PANEL BY RT-PCR (RSV, FLU A&B, COVID)  RVPGX2
Influenza A by PCR: NEGATIVE
Influenza B by PCR: NEGATIVE
Resp Syncytial Virus by PCR: NEGATIVE
SARS Coronavirus 2 by RT PCR: NEGATIVE

## 2022-08-24 LAB — RAPID URINE DRUG SCREEN, HOSP PERFORMED
Amphetamines: NOT DETECTED
Barbiturates: NOT DETECTED
Benzodiazepines: POSITIVE — AB
Cocaine: POSITIVE — AB
Opiates: POSITIVE — AB
Tetrahydrocannabinol: NOT DETECTED

## 2022-08-24 LAB — ETHANOL: Alcohol, Ethyl (B): <10 mg/dL (ref <10)

## 2022-08-24 MED ORDER — NALOXONE HCL 4 MG/0.1ML NA LIQD: 1.0000 | NASAL | Status: DC | PRN

## 2022-08-24 MED ORDER — SIMVASTATIN 10 MG PO TABS
40.0000 mg | ORAL_TABLET | Freq: Every day | ORAL | Status: DC
  Administered 2022-08-24: 40 mg via ORAL
  Filled 2022-08-24: qty 4

## 2022-08-24 MED ORDER — SODIUM CHLORIDE 0.9 % IV BOLUS
1000.0000 mL | Freq: Once | INTRAVENOUS | Status: AC
  Administered 2022-08-24: 1000 mL via INTRAVENOUS

## 2022-08-24 MED ORDER — OXYCODONE HCL 5 MG PO TABS: 5.0000 mg | ORAL_TABLET | ORAL | Status: DC | PRN

## 2022-08-24 MED ORDER — METHADONE HCL 10 MG PO TABS
10.0000 mg | ORAL_TABLET | Freq: Every day | ORAL | Status: DC
  Administered 2022-08-24 – 2022-08-25 (×2): 10 mg via ORAL
  Filled 2022-08-24 (×2): qty 1

## 2022-08-24 MED ORDER — OXYCODONE-ACETAMINOPHEN 10-325 MG PO TABS: 1.0000 | ORAL_TABLET | ORAL | Status: DC | PRN

## 2022-08-24 MED ORDER — DOCUSATE SODIUM 100 MG PO CAPS
400.0000 mg | ORAL_CAPSULE | Freq: Every day | ORAL | Status: DC
  Administered 2022-08-24 – 2022-08-25 (×2): 400 mg via ORAL
  Filled 2022-08-24 (×3): qty 4

## 2022-08-24 MED ORDER — OXYCODONE-ACETAMINOPHEN 5-325 MG PO TABS
1.0000 | ORAL_TABLET | ORAL | Status: DC | PRN
  Administered 2022-08-24 – 2022-08-25 (×4): 1 via ORAL
  Filled 2022-08-24 (×4): qty 1
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
## 2022-08-24 NOTE — BH Assessment (Addendum)
@  0900, Clinician attempted to assess patient. However, he seemed very drowsy. Several questions were asked and Clinician had to awaken patient multiple time as attempt to get him to respond to questions. Patient's nurse was notified of the attempts to assess patient. Requested that she or [facility] contact TTS when patient is alert enough to participate in a assessment.   '@1243'$ , notified patient's  is alert and oriented, ready to be seen, informed his nurse that the next available TTS worker will see him when available.
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
## 2022-08-24 NOTE — ED Notes (Signed)
Per TTS, pt is too sleepy for assessment. Will notify them when pt is more alert.

## 2022-08-24 NOTE — ED Notes (Signed)
TTS machine at bedside for assessment.

## 2022-08-24 NOTE — ED Notes (Signed)
Pt requesting oxycodone for chronic back and knee pain. Pt continues to fall asleep mid-conversation. Will notify MD when he is more alert.

## 2022-08-24 NOTE — ED Notes (Signed)
Patient is currently A/O x4. His is eating lunch at the moment and complaining of back pain that is 8/10

## 2022-08-24 NOTE — ED Notes (Signed)
Pt belonging bag placed in locker 3 Clothes, shoes, keys, wallet, lighter    Money locked up in security office
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
## 2022-08-25 ENCOUNTER — Encounter (HOSPITAL_COMMUNITY): Payer: Self-pay | Admitting: Registered Nurse

## 2022-08-25 DIAGNOSIS — F19951 Other psychoactive substance use, unspecified with psychoactive substance-induced psychotic disorder with hallucinations: Secondary | ICD-10-CM | POA: Diagnosis not present

## 2022-08-25 NOTE — ED Provider Notes (Signed)
Patient designated as appropriate for outpatient follow-up by behavioral health.   Carmin Muskrat, MD 08/25/22 [phone]

## 2022-08-25 NOTE — ED Notes (Signed)
Tele psych machine to bedside  

## 2022-08-25 NOTE — ED Notes (Signed)
Pt d/c home per MD order, discharge summary reviewed with pt, pt verbalizes understanding. Ambulatory off unit. No s/s of acute distress noted at discharge, pt reports discharge ride hime.
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
## 2022-08-25 NOTE — ED Provider Notes (Signed)
Emergency Medicine Observation Re-evaluation Note  [name] [name] is a 57 y.o. male, seen on rounds today.  Pt initially presented to the ED for complaints of Hallucinations Currently, the patient is resting.  Physical Exam  BP (!) 150/85 (BP Location: Right Arm)   Pulse 87   Temp 97.8 F (36.6 C) (Oral)   Resp 16   Ht '5\' 7"'$  (1.702 m)   Wt 86.2 kg   SpO2 100%   BMI 29.76 kg/m  Physical Exam General: Calm, no distress Cardiac: Regular rate and rhythm Lungs: No increased work of breathing Psych: Calm  ED Course / MDM  EKG:EKG Interpretation  Date/Time:  Tuesday August 24 2022 05:38:31 EST Ventricular Rate:  113 PR Interval:  156 QRS Duration: 78 QT Interval:  356 QTC Calculation: 488 R Axis:   77 Text Interpretation: Sinus tachycardia with Premature ventricular complexes or Fusion complexes Otherwise normal ECG When compared with ECG of 25-Jun-2021 16:40, Fusion complexes are now Present Premature ventricular complexes are now Present Confirmed by Calvert Cantor [id number]) on 08/24/2022 5:47:07 AM  I have reviewed the labs performed to date as well as medications administered while in observation.  Recent changes in the last 24 hours include fluid resuscitation after initial findings were suggestive of dehydration, otherwise reassuring.  Plan  Current plan is for psych placement.    Carmin Muskrat, MD 08/25/22 [phone]

## 2022-08-25 NOTE — Discharge Instructions (Signed)
Please be sure to stay well-hydrated, take your medication as directed and follow-up with your physician.  Return for concerning changes in your condition.

## 2022-08-31 DIAGNOSIS — Z6831 Body mass index (BMI) 31.0-31.9, adult: Secondary | ICD-10-CM | POA: Diagnosis not present

## 2022-08-31 DIAGNOSIS — J449 Chronic obstructive pulmonary disease, unspecified: Secondary | ICD-10-CM | POA: Diagnosis not present

## 2022-08-31 DIAGNOSIS — G894 Chronic pain syndrome: Secondary | ICD-10-CM | POA: Diagnosis not present

## 2022-08-31 DIAGNOSIS — F1120 Opioid dependence, uncomplicated: Secondary | ICD-10-CM | POA: Diagnosis not present

## 2022-08-31 DIAGNOSIS — M79604 Pain in right leg: Secondary | ICD-10-CM | POA: Diagnosis not present

## 2022-08-31 DIAGNOSIS — M545 Low back pain, unspecified: Secondary | ICD-10-CM | POA: Diagnosis not present

## 2022-08-31 DIAGNOSIS — M5136 Other intervertebral disc degeneration, lumbar region: Secondary | ICD-10-CM | POA: Diagnosis not present

## 2022-08-31 DIAGNOSIS — R030 Elevated blood-pressure reading, without diagnosis of hypertension: Secondary | ICD-10-CM | POA: Diagnosis not present

## 2022-08-31 DIAGNOSIS — Z79899 Other long term (current) drug therapy: Secondary | ICD-10-CM | POA: Diagnosis not present

## 2022-09-08 DIAGNOSIS — I10 Essential (primary) hypertension: Secondary | ICD-10-CM | POA: Diagnosis not present

## 2022-09-08 DIAGNOSIS — K219 Gastro-esophageal reflux disease without esophagitis: Secondary | ICD-10-CM | POA: Diagnosis not present

## 2022-09-08 DIAGNOSIS — M549 Dorsalgia, unspecified: Secondary | ICD-10-CM | POA: Diagnosis not present

## 2022-09-08 DIAGNOSIS — E785 Hyperlipidemia, unspecified: Secondary | ICD-10-CM | POA: Diagnosis not present

## 2022-09-08 DIAGNOSIS — J410 Simple chronic bronchitis: Secondary | ICD-10-CM | POA: Diagnosis not present

## 2022-09-27 DIAGNOSIS — Z79899 Other long term (current) drug therapy: Secondary | ICD-10-CM | POA: Diagnosis not present

## 2022-09-27 DIAGNOSIS — J449 Chronic obstructive pulmonary disease, unspecified: Secondary | ICD-10-CM | POA: Diagnosis not present

## 2022-09-27 DIAGNOSIS — F1120 Opioid dependence, uncomplicated: Secondary | ICD-10-CM | POA: Diagnosis not present

## 2022-09-27 DIAGNOSIS — M5136 Other intervertebral disc degeneration, lumbar region: Secondary | ICD-10-CM | POA: Diagnosis not present

## 2022-09-27 DIAGNOSIS — Z6831 Body mass index (BMI) 31.0-31.9, adult: Secondary | ICD-10-CM | POA: Diagnosis not present

## 2022-09-27 DIAGNOSIS — R030 Elevated blood-pressure reading, without diagnosis of hypertension: Secondary | ICD-10-CM | POA: Diagnosis not present

## 2022-09-27 DIAGNOSIS — E7800 Pure hypercholesterolemia, unspecified: Secondary | ICD-10-CM | POA: Diagnosis not present

## 2022-09-27 DIAGNOSIS — M545 Low back pain, unspecified: Secondary | ICD-10-CM | POA: Diagnosis not present

## 2022-09-27 DIAGNOSIS — G894 Chronic pain syndrome: Secondary | ICD-10-CM | POA: Diagnosis not present

## 2022-09-27 DIAGNOSIS — M79604 Pain in right leg: Secondary | ICD-10-CM | POA: Diagnosis not present

## 2022-10-01 DIAGNOSIS — Z79899 Other long term (current) drug therapy: Secondary | ICD-10-CM | POA: Diagnosis not present

## 2022-10-07 DIAGNOSIS — I10 Essential (primary) hypertension: Secondary | ICD-10-CM | POA: Diagnosis not present

## 2022-10-07 DIAGNOSIS — M549 Dorsalgia, unspecified: Secondary | ICD-10-CM | POA: Diagnosis not present

## 2022-10-25 DIAGNOSIS — F1120 Opioid dependence, uncomplicated: Secondary | ICD-10-CM | POA: Diagnosis not present

## 2022-10-25 DIAGNOSIS — M5136 Other intervertebral disc degeneration, lumbar region: Secondary | ICD-10-CM | POA: Diagnosis not present

## 2022-10-25 DIAGNOSIS — M79604 Pain in right leg: Secondary | ICD-10-CM | POA: Diagnosis not present

## 2022-10-25 DIAGNOSIS — R030 Elevated blood-pressure reading, without diagnosis of hypertension: Secondary | ICD-10-CM | POA: Diagnosis not present

## 2022-10-25 DIAGNOSIS — Z79899 Other long term (current) drug therapy: Secondary | ICD-10-CM | POA: Diagnosis not present

## 2022-10-25 DIAGNOSIS — M545 Low back pain, unspecified: Secondary | ICD-10-CM | POA: Diagnosis not present

## 2022-10-25 DIAGNOSIS — Z6831 Body mass index (BMI) 31.0-31.9, adult: Secondary | ICD-10-CM | POA: Diagnosis not present

## 2022-10-25 DIAGNOSIS — J449 Chronic obstructive pulmonary disease, unspecified: Secondary | ICD-10-CM | POA: Diagnosis not present

## 2022-10-25 DIAGNOSIS — G894 Chronic pain syndrome: Secondary | ICD-10-CM | POA: Diagnosis not present

## 2022-11-01 DIAGNOSIS — Z79899 Other long term (current) drug therapy: Secondary | ICD-10-CM | POA: Diagnosis not present

## 2022-11-07 DIAGNOSIS — M549 Dorsalgia, unspecified: Secondary | ICD-10-CM | POA: Diagnosis not present

## 2022-11-07 DIAGNOSIS — I10 Essential (primary) hypertension: Secondary | ICD-10-CM | POA: Diagnosis not present

## 2022-11-22 DIAGNOSIS — Z79899 Other long term (current) drug therapy: Secondary | ICD-10-CM | POA: Diagnosis not present

## 2022-11-22 DIAGNOSIS — M79604 Pain in right leg: Secondary | ICD-10-CM | POA: Diagnosis not present

## 2022-11-22 DIAGNOSIS — M545 Low back pain, unspecified: Secondary | ICD-10-CM | POA: Diagnosis not present

## 2022-11-22 DIAGNOSIS — G894 Chronic pain syndrome: Secondary | ICD-10-CM | POA: Diagnosis not present

## 2022-11-22 DIAGNOSIS — F1120 Opioid dependence, uncomplicated: Secondary | ICD-10-CM | POA: Diagnosis not present

## 2022-11-22 DIAGNOSIS — R030 Elevated blood-pressure reading, without diagnosis of hypertension: Secondary | ICD-10-CM | POA: Diagnosis not present

## 2022-11-22 DIAGNOSIS — M5136 Other intervertebral disc degeneration, lumbar region: Secondary | ICD-10-CM | POA: Diagnosis not present

## 2022-11-22 DIAGNOSIS — Z6830 Body mass index (BMI) 30.0-30.9, adult: Secondary | ICD-10-CM | POA: Diagnosis not present

## 2022-11-22 DIAGNOSIS — J449 Chronic obstructive pulmonary disease, unspecified: Secondary | ICD-10-CM | POA: Diagnosis not present

## 2022-11-25 DIAGNOSIS — Z79899 Other long term (current) drug therapy: Secondary | ICD-10-CM | POA: Diagnosis not present

## 2022-12-07 DIAGNOSIS — I10 Essential (primary) hypertension: Secondary | ICD-10-CM | POA: Diagnosis not present

## 2022-12-07 DIAGNOSIS — M549 Dorsalgia, unspecified: Secondary | ICD-10-CM | POA: Diagnosis not present

## 2022-12-20 DIAGNOSIS — M79604 Pain in right leg: Secondary | ICD-10-CM | POA: Diagnosis not present

## 2022-12-20 DIAGNOSIS — M545 Low back pain, unspecified: Secondary | ICD-10-CM | POA: Diagnosis not present

## 2022-12-20 DIAGNOSIS — G894 Chronic pain syndrome: Secondary | ICD-10-CM | POA: Diagnosis not present

## 2022-12-20 DIAGNOSIS — Z6831 Body mass index (BMI) 31.0-31.9, adult: Secondary | ICD-10-CM | POA: Diagnosis not present

## 2022-12-20 DIAGNOSIS — E559 Vitamin D deficiency, unspecified: Secondary | ICD-10-CM | POA: Diagnosis not present

## 2022-12-20 DIAGNOSIS — Z79899 Other long term (current) drug therapy: Secondary | ICD-10-CM | POA: Diagnosis not present

## 2022-12-20 DIAGNOSIS — M5136 Other intervertebral disc degeneration, lumbar region: Secondary | ICD-10-CM | POA: Diagnosis not present

## 2022-12-20 DIAGNOSIS — R030 Elevated blood-pressure reading, without diagnosis of hypertension: Secondary | ICD-10-CM | POA: Diagnosis not present

## 2022-12-20 DIAGNOSIS — F1120 Opioid dependence, uncomplicated: Secondary | ICD-10-CM | POA: Diagnosis not present

## 2022-12-28 ENCOUNTER — Inpatient Hospital Stay (HOSPITAL_COMMUNITY): Payer: Medicare HMO

## 2022-12-28 ENCOUNTER — Inpatient Hospital Stay (HOSPITAL_COMMUNITY)
Admission: EM | Admit: 2022-12-28 | Discharge: 2023-01-01 | DRG: 321 | Disposition: A | Discharge status: Home / Self Care | Payer: Medicare HMO | Attending: Internal Medicine | Admitting: Internal Medicine

## 2022-12-28 ENCOUNTER — Emergency Department (HOSPITAL_COMMUNITY): Payer: Medicare HMO

## 2022-12-28 ENCOUNTER — Encounter (HOSPITAL_COMMUNITY)
Admission: EM | Disposition: A | Discharge status: Home / Self Care | Payer: Self-pay | Source: Home / Self Care | Attending: Internal Medicine

## 2022-12-28 DIAGNOSIS — Z79899 Other long term (current) drug therapy: Secondary | ICD-10-CM

## 2022-12-28 DIAGNOSIS — M25512 Pain in left shoulder: Secondary | ICD-10-CM | POA: Diagnosis present

## 2022-12-28 DIAGNOSIS — Z6372 Alcoholism and drug addiction in family: Secondary | ICD-10-CM

## 2022-12-28 DIAGNOSIS — Z91148 Patient's other noncompliance with medication regimen for other reason: Secondary | ICD-10-CM

## 2022-12-28 DIAGNOSIS — D72829 Elevated white blood cell count, unspecified: Secondary | ICD-10-CM | POA: Diagnosis present

## 2022-12-28 DIAGNOSIS — Z7902 Long term (current) use of antithrombotics/antiplatelets: Secondary | ICD-10-CM | POA: Diagnosis not present

## 2022-12-28 DIAGNOSIS — K2970 Gastritis, unspecified, without bleeding: Secondary | ICD-10-CM | POA: Diagnosis not present

## 2022-12-28 DIAGNOSIS — N179 Acute kidney failure, unspecified: Secondary | ICD-10-CM | POA: Diagnosis present

## 2022-12-28 DIAGNOSIS — K920 Hematemesis: Secondary | ICD-10-CM | POA: Diagnosis not present

## 2022-12-28 DIAGNOSIS — F1994 Other psychoactive substance use, unspecified with psychoactive substance-induced mood disorder: Secondary | ICD-10-CM | POA: Diagnosis present

## 2022-12-28 DIAGNOSIS — K226 Gastro-esophageal laceration-hemorrhage syndrome: Secondary | ICD-10-CM | POA: Diagnosis not present

## 2022-12-28 DIAGNOSIS — K8020 Calculus of gallbladder without cholecystitis without obstruction: Secondary | ICD-10-CM | POA: Diagnosis not present

## 2022-12-28 DIAGNOSIS — I2510 Atherosclerotic heart disease of native coronary artery without angina pectoris: Secondary | ICD-10-CM | POA: Diagnosis not present

## 2022-12-28 DIAGNOSIS — E860 Dehydration: Secondary | ICD-10-CM | POA: Diagnosis present

## 2022-12-28 DIAGNOSIS — I2109 ST elevation (STEMI) myocardial infarction involving other coronary artery of anterior wall: Principal | ICD-10-CM | POA: Diagnosis present

## 2022-12-28 DIAGNOSIS — F1410 Cocaine abuse, uncomplicated: Secondary | ICD-10-CM | POA: Diagnosis present

## 2022-12-28 DIAGNOSIS — K922 Gastrointestinal hemorrhage, unspecified: Secondary | ICD-10-CM | POA: Diagnosis not present

## 2022-12-28 DIAGNOSIS — R7401 Elevation of levels of liver transaminase levels: Secondary | ICD-10-CM | POA: Diagnosis present

## 2022-12-28 DIAGNOSIS — Z9049 Acquired absence of other specified parts of digestive tract: Secondary | ICD-10-CM

## 2022-12-28 DIAGNOSIS — I213 ST elevation (STEMI) myocardial infarction of unspecified site: Secondary | ICD-10-CM

## 2022-12-28 DIAGNOSIS — I110 Hypertensive heart disease with heart failure: Secondary | ICD-10-CM | POA: Diagnosis present

## 2022-12-28 DIAGNOSIS — I10 Essential (primary) hypertension: Secondary | ICD-10-CM | POA: Diagnosis not present

## 2022-12-28 DIAGNOSIS — G8929 Other chronic pain: Secondary | ICD-10-CM | POA: Diagnosis not present

## 2022-12-28 DIAGNOSIS — I2582 Chronic total occlusion of coronary artery: Secondary | ICD-10-CM | POA: Diagnosis present

## 2022-12-28 DIAGNOSIS — K259 Gastric ulcer, unspecified as acute or chronic, without hemorrhage or perforation: Secondary | ICD-10-CM | POA: Diagnosis present

## 2022-12-28 DIAGNOSIS — D62 Acute posthemorrhagic anemia: Secondary | ICD-10-CM | POA: Diagnosis not present

## 2022-12-28 DIAGNOSIS — I2102 ST elevation (STEMI) myocardial infarction involving left anterior descending coronary artery: Secondary | ICD-10-CM

## 2022-12-28 DIAGNOSIS — I25110 Atherosclerotic heart disease of native coronary artery with unstable angina pectoris: Secondary | ICD-10-CM | POA: Diagnosis not present

## 2022-12-28 DIAGNOSIS — R55 Syncope and collapse: Secondary | ICD-10-CM | POA: Diagnosis present

## 2022-12-28 DIAGNOSIS — E785 Hyperlipidemia, unspecified: Secondary | ICD-10-CM | POA: Diagnosis not present

## 2022-12-28 DIAGNOSIS — Z955 Presence of coronary angioplasty implant and graft: Secondary | ICD-10-CM

## 2022-12-28 DIAGNOSIS — Z87891 Personal history of nicotine dependence: Secondary | ICD-10-CM | POA: Diagnosis not present

## 2022-12-28 DIAGNOSIS — W1789XA Other fall from one level to another, initial encounter: Secondary | ICD-10-CM | POA: Diagnosis present

## 2022-12-28 DIAGNOSIS — K219 Gastro-esophageal reflux disease without esophagitis: Secondary | ICD-10-CM | POA: Diagnosis present

## 2022-12-28 DIAGNOSIS — K269 Duodenal ulcer, unspecified as acute or chronic, without hemorrhage or perforation: Secondary | ICD-10-CM | POA: Diagnosis not present

## 2022-12-28 DIAGNOSIS — I252 Old myocardial infarction: Secondary | ICD-10-CM | POA: Diagnosis not present

## 2022-12-28 DIAGNOSIS — I5032 Chronic diastolic (congestive) heart failure: Secondary | ICD-10-CM | POA: Diagnosis present

## 2022-12-28 DIAGNOSIS — K3189 Other diseases of stomach and duodenum: Secondary | ICD-10-CM | POA: Diagnosis not present

## 2022-12-28 DIAGNOSIS — E876 Hypokalemia: Secondary | ICD-10-CM | POA: Diagnosis present

## 2022-12-28 DIAGNOSIS — K2289 Other specified disease of esophagus: Secondary | ICD-10-CM | POA: Diagnosis not present

## 2022-12-28 DIAGNOSIS — F99 Mental disorder, not otherwise specified: Secondary | ICD-10-CM | POA: Diagnosis not present

## 2022-12-28 DIAGNOSIS — F05 Delirium due to known physiological condition: Secondary | ICD-10-CM | POA: Diagnosis not present

## 2022-12-28 DIAGNOSIS — F319 Bipolar disorder, unspecified: Secondary | ICD-10-CM | POA: Diagnosis present

## 2022-12-28 DIAGNOSIS — R079 Chest pain, unspecified: Secondary | ICD-10-CM | POA: Diagnosis not present

## 2022-12-28 DIAGNOSIS — R456 Violent behavior: Secondary | ICD-10-CM | POA: Diagnosis not present

## 2022-12-28 DIAGNOSIS — E872 Acidosis, unspecified: Secondary | ICD-10-CM | POA: Diagnosis not present

## 2022-12-28 DIAGNOSIS — G894 Chronic pain syndrome: Secondary | ICD-10-CM | POA: Diagnosis present

## 2022-12-28 DIAGNOSIS — G4730 Sleep apnea, unspecified: Secondary | ICD-10-CM | POA: Diagnosis present

## 2022-12-28 DIAGNOSIS — F1210 Cannabis abuse, uncomplicated: Secondary | ICD-10-CM | POA: Diagnosis present

## 2022-12-28 DIAGNOSIS — G9341 Metabolic encephalopathy: Secondary | ICD-10-CM | POA: Diagnosis present

## 2022-12-28 DIAGNOSIS — E782 Mixed hyperlipidemia: Secondary | ICD-10-CM | POA: Diagnosis not present

## 2022-12-28 DIAGNOSIS — G629 Polyneuropathy, unspecified: Secondary | ICD-10-CM | POA: Diagnosis present

## 2022-12-28 DIAGNOSIS — R945 Abnormal results of liver function studies: Secondary | ICD-10-CM | POA: Diagnosis not present

## 2022-12-28 DIAGNOSIS — M25511 Pain in right shoulder: Secondary | ICD-10-CM | POA: Diagnosis present

## 2022-12-28 DIAGNOSIS — R739 Hyperglycemia, unspecified: Secondary | ICD-10-CM | POA: Diagnosis not present

## 2022-12-28 DIAGNOSIS — Z7151 Drug abuse counseling and surveillance of drug abuser: Secondary | ICD-10-CM

## 2022-12-28 DIAGNOSIS — K2990 Gastroduodenitis, unspecified, without bleeding: Secondary | ICD-10-CM | POA: Diagnosis not present

## 2022-12-28 DIAGNOSIS — I25119 Atherosclerotic heart disease of native coronary artery with unspecified angina pectoris: Secondary | ICD-10-CM | POA: Diagnosis not present

## 2022-12-28 DIAGNOSIS — Z886 Allergy status to analgesic agent status: Secondary | ICD-10-CM

## 2022-12-28 DIAGNOSIS — K449 Diaphragmatic hernia without obstruction or gangrene: Secondary | ICD-10-CM | POA: Diagnosis not present

## 2022-12-28 DIAGNOSIS — Z9682 Presence of neurostimulator: Secondary | ICD-10-CM

## 2022-12-28 DIAGNOSIS — F1120 Opioid dependence, uncomplicated: Secondary | ICD-10-CM | POA: Diagnosis present

## 2022-12-28 DIAGNOSIS — T45525A Adverse effect of antithrombotic drugs, initial encounter: Secondary | ICD-10-CM | POA: Diagnosis not present

## 2022-12-28 HISTORY — PX: CORONARY/GRAFT ACUTE MI REVASCULARIZATION: CATH118305

## 2022-12-28 HISTORY — PX: CORONARY STENT INTERVENTION: CATH118234

## 2022-12-28 HISTORY — PX: LEFT HEART CATH AND CORONARY ANGIOGRAPHY: CATH118249

## 2022-12-28 LAB — APTT: aPTT: 28 seconds (ref 24–36)

## 2022-12-28 LAB — LIPID PANEL
Cholesterol: 251 mg/dL — ABNORMAL HIGH (ref 0–200)
HDL: 37 mg/dL — ABNORMAL LOW (ref >40)
LDL Cholesterol: 186 mg/dL — ABNORMAL HIGH (ref 0–99)
Total CHOL/HDL Ratio: 6.8 RATIO
Triglycerides: 139 mg/dL (ref <150)
VLDL: 28 mg/dL (ref 0–40)

## 2022-12-28 LAB — POCT I-STAT, CHEM 8
BUN: 12 mg/dL (ref 6–20)
Calcium, Ion: 1.17 mmol/L (ref 1.15–1.40)
Chloride: 107 mmol/L (ref 98–111)
Creatinine, Ser: 1.4 mg/dL — ABNORMAL HIGH (ref 0.61–1.24)
Glucose, Bld: 129 mg/dL — ABNORMAL HIGH (ref 70–99)
HCT: 46 % (ref 39.0–52.0)
Hemoglobin: 15.6 g/dL (ref 13.0–17.0)
Potassium: 4 mmol/L (ref 3.5–5.1)
Sodium: 141 mmol/L (ref 135–145)
TCO2: 17 mmol/L — ABNORMAL LOW (ref 22–32)

## 2022-12-28 LAB — CBC WITH DIFFERENTIAL/PLATELET
Abs Immature Granulocytes: 0.08 10*3/uL — ABNORMAL HIGH (ref 0.00–0.07)
Basophils Absolute: 0 10*3/uL (ref 0.0–0.1)
Basophils Relative: 0 %
Eosinophils Absolute: 0 10*3/uL (ref 0.0–0.5)
Eosinophils Relative: 0 %
HCT: 47.1 % (ref 39.0–52.0)
Hemoglobin: 16.1 g/dL (ref 13.0–17.0)
Immature Granulocytes: 1 %
Lymphocytes Relative: 9 %
Lymphs Abs: 1.4 10*3/uL (ref 0.7–4.0)
MCH: 30.3 pg (ref 26.0–34.0)
MCHC: 34.2 g/dL (ref 30.0–36.0)
MCV: 88.5 fL (ref 80.0–100.0)
Monocytes Absolute: 0.5 10*3/uL (ref 0.1–1.0)
Monocytes Relative: 3 %
Neutro Abs: 13.5 10*3/uL — ABNORMAL HIGH (ref 1.7–7.7)
Neutrophils Relative %: 87 %
Platelets: 277 10*3/uL (ref 150–400)
RBC: 5.32 MIL/uL (ref 4.22–5.81)
RDW: 12 % (ref 11.5–15.5)
WBC: 15.4 10*3/uL — ABNORMAL HIGH (ref 4.0–10.5)
nRBC: 0 % (ref 0.0–0.2)

## 2022-12-28 LAB — COMPREHENSIVE METABOLIC PANEL
ALT: 27 U/L (ref 0–44)
AST: 39 U/L (ref 15–41)
Albumin: 3.9 g/dL (ref 3.5–5.0)
Alkaline Phosphatase: 57 U/L (ref 38–126)
Anion gap: 18 — ABNORMAL HIGH (ref 5–15)
BUN: 12 mg/dL (ref 6–20)
CO2: 14 mmol/L — ABNORMAL LOW (ref 22–32)
Calcium: 8.7 mg/dL — ABNORMAL LOW (ref 8.9–10.3)
Chloride: 106 mmol/L (ref 98–111)
Creatinine, Ser: 1.6 mg/dL — ABNORMAL HIGH (ref 0.61–1.24)
GFR, Estimated: 50 mL/min — ABNORMAL LOW (ref >60)
Glucose, Bld: 151 mg/dL — ABNORMAL HIGH (ref 70–99)
Potassium: 4.2 mmol/L (ref 3.5–5.1)
Sodium: 138 mmol/L (ref 135–145)
Total Bilirubin: 0.9 mg/dL (ref 0.3–1.2)
Total Protein: 6.5 g/dL (ref 6.5–8.1)

## 2022-12-28 LAB — PROTIME-INR
INR: 1.2 (ref 0.8–1.2)
Prothrombin Time: 15.1 seconds (ref 11.4–15.2)

## 2022-12-28 LAB — CBC
HCT: 46.6 % (ref 39.0–52.0)
Hemoglobin: 16.1 g/dL (ref 13.0–17.0)
MCH: 29.8 pg (ref 26.0–34.0)
MCHC: 34.5 g/dL (ref 30.0–36.0)
MCV: 86.1 fL (ref 80.0–100.0)
Platelets: 267 10*3/uL (ref 150–400)
RBC: 5.41 MIL/uL (ref 4.22–5.81)
RDW: 12.3 % (ref 11.5–15.5)
WBC: 23.8 10*3/uL — ABNORMAL HIGH (ref 4.0–10.5)
nRBC: 0 % (ref 0.0–0.2)

## 2022-12-28 LAB — POCT ACTIVATED CLOTTING TIME
Activated Clotting Time: 363 seconds
Activated Clotting Time: 271 seconds

## 2022-12-28 LAB — TYPE AND SCREEN
ABO/RH(D): O POS
ABO/RH(D): O POS
Antibody Screen: NEGATIVE
Antibody Screen: NEGATIVE

## 2022-12-28 LAB — TROPONIN I (HIGH SENSITIVITY): Troponin I (High Sensitivity): 916 ng/L (ref <18)

## 2022-12-28 LAB — RAPID URINE DRUG SCREEN, HOSP PERFORMED
Amphetamines: NOT DETECTED
Barbiturates: NOT DETECTED
Benzodiazepines: POSITIVE — AB
Cocaine: POSITIVE — AB
Opiates: NOT DETECTED
Tetrahydrocannabinol: POSITIVE — AB

## 2022-12-28 LAB — MRSA NEXT GEN BY PCR, NASAL: MRSA by PCR Next Gen: NOT DETECTED

## 2022-12-28 SURGERY — CORONARY/GRAFT ACUTE MI REVASCULARIZATION
Anesthesia: LOCAL

## 2022-12-28 MED ORDER — LABETALOL HCL 5 MG/ML IV SOLN: 10.0000 mg | INTRAVENOUS | Status: AC | PRN

## 2022-12-28 MED ORDER — CHLORHEXIDINE GLUCONATE CLOTH 2 % EX PADS
6.0000 | MEDICATED_PAD | Freq: Every day | CUTANEOUS | Status: DC
  Administered 2022-12-28 – 2023-01-01 (×5): 6 via TOPICAL

## 2022-12-28 MED ORDER — VERAPAMIL HCL 2.5 MG/ML IV SOLN
INTRAVENOUS | Status: AC
  Filled 2022-12-28: qty 2

## 2022-12-28 MED ORDER — LIDOCAINE HCL (PF) 1 % IJ SOLN
INTRAMUSCULAR | Status: DC | PRN
  Administered 2022-12-28: 2 mL

## 2022-12-28 MED ORDER — DOCUSATE SODIUM 100 MG PO CAPS
400.0000 mg | ORAL_CAPSULE | Freq: Every day | ORAL | Status: DC
  Administered 2022-12-29 – 2023-01-01 (×4): 400 mg via ORAL
  Filled 2022-12-28 (×4): qty 4

## 2022-12-28 MED ORDER — ONDANSETRON HCL 4 MG/2ML IJ SOLN
4.0000 mg | Freq: Four times a day (QID) | INTRAMUSCULAR | Status: DC | PRN
  Administered 2022-12-28 – 2022-12-29 (×3): 4 mg via INTRAVENOUS
  Filled 2022-12-28 (×3): qty 2

## 2022-12-28 MED ORDER — OXYCODONE HCL 5 MG PO TABS
5.0000 mg | ORAL_TABLET | ORAL | Status: DC | PRN
  Administered 2022-12-28 – 2023-01-01 (×15): 5 mg via ORAL
  Filled 2022-12-28 (×16): qty 1

## 2022-12-28 MED ORDER — SODIUM CHLORIDE 0.9 % IV SOLN: INTRAVENOUS | Status: DC

## 2022-12-28 MED ORDER — HEPARIN SODIUM (PORCINE) 5000 UNIT/ML IJ SOLN
4000.0000 [IU] | Freq: Once | INTRAMUSCULAR | Status: AC
  Administered 2022-12-28: 4000 [IU] via INTRAVENOUS

## 2022-12-28 MED ORDER — VERAPAMIL HCL 2.5 MG/ML IV SOLN
INTRAVENOUS | Status: DC | PRN
  Administered 2022-12-28: 10 mL via INTRA_ARTERIAL

## 2022-12-28 MED ORDER — ASPIRIN 81 MG PO CHEW: 81.0000 mg | CHEWABLE_TABLET | Freq: Every day | ORAL | Status: DC

## 2022-12-28 MED ORDER — FENTANYL CITRATE (PF) 100 MCG/2ML IJ SOLN
INTRAMUSCULAR | Status: DC | PRN
  Administered 2022-12-28: 50 ug via INTRAVENOUS
  Administered 2022-12-28: 25 ug via INTRAVENOUS

## 2022-12-28 MED ORDER — LIDOCAINE HCL (PF) 1 % IJ SOLN
INTRAMUSCULAR | Status: AC
  Filled 2022-12-28: qty 30

## 2022-12-28 MED ORDER — SODIUM CHLORIDE 0.9% FLUSH
3.0000 mL | Freq: Two times a day (BID) | INTRAVENOUS | Status: DC
  Administered 2022-12-29 – 2023-01-01 (×8): 3 mL via INTRAVENOUS

## 2022-12-28 MED ORDER — TICAGRELOR 90 MG PO TABS
ORAL_TABLET | ORAL | Status: AC
  Filled 2022-12-28: qty 2

## 2022-12-28 MED ORDER — OXYCODONE-ACETAMINOPHEN 10-325 MG PO TABS: 1.0000 | ORAL_TABLET | ORAL | Status: DC | PRN

## 2022-12-28 MED ORDER — NALOXONE HCL 4 MG/0.1ML NA LIQD: 1.0000 | Freq: Four times a day (QID) | NASAL | Status: DC | PRN

## 2022-12-28 MED ORDER — HEPARIN SODIUM (PORCINE) 1000 UNIT/ML IJ SOLN
INTRAMUSCULAR | Status: DC | PRN
  Administered 2022-12-28: 2000 [IU] via INTRAVENOUS
  Administered 2022-12-28: 7000 [IU] via INTRAVENOUS

## 2022-12-28 MED ORDER — SODIUM CHLORIDE 0.9 % IV SOLN
INTRAVENOUS | Status: AC | PRN
  Administered 2022-12-28: 10 mL/h via INTRAVENOUS

## 2022-12-28 MED ORDER — HEPARIN (PORCINE) IN NACL 1000-0.9 UT/500ML-% IV SOLN
INTRAVENOUS | Status: DC | PRN
  Administered 2022-12-28 (×2): 500 mL

## 2022-12-28 MED ORDER — SODIUM CHLORIDE 0.9 % IV SOLN: INTRAVENOUS | Status: AC

## 2022-12-28 MED ORDER — ACETAMINOPHEN 325 MG PO TABS: 650.0000 mg | ORAL_TABLET | ORAL | Status: DC | PRN

## 2022-12-28 MED ORDER — HYDRALAZINE HCL 20 MG/ML IJ SOLN: 10.0000 mg | INTRAMUSCULAR | Status: AC | PRN

## 2022-12-28 MED ORDER — TICAGRELOR 90 MG PO TABS
ORAL_TABLET | ORAL | Status: DC | PRN
  Administered 2022-12-28: 180 mg via ORAL

## 2022-12-28 MED ORDER — TICAGRELOR 90 MG PO TABS
90.0000 mg | ORAL_TABLET | Freq: Two times a day (BID) | ORAL | Status: DC
  Administered 2022-12-29 – 2023-01-01 (×7): 90 mg via ORAL
  Filled 2022-12-28 (×7): qty 1

## 2022-12-28 MED ORDER — IOHEXOL 350 MG/ML SOLN
INTRAVENOUS | Status: DC | PRN
  Administered 2022-12-28: 80 mL

## 2022-12-28 MED ORDER — OXYCODONE-ACETAMINOPHEN 5-325 MG PO TABS
1.0000 | ORAL_TABLET | ORAL | Status: DC | PRN
  Administered 2022-12-28 – 2022-12-31 (×11): 1 via ORAL
  Filled 2022-12-28 (×11): qty 1

## 2022-12-28 MED ORDER — VERAPAMIL HCL 2.5 MG/ML IV SOLN
INTRAVENOUS | Status: DC | PRN
  Administered 2022-12-28: 200 ug via INTRACORONARY

## 2022-12-28 MED ORDER — PANTOPRAZOLE SODIUM 40 MG PO TBEC: 40.0000 mg | DELAYED_RELEASE_TABLET | Freq: Every day | ORAL | Status: DC

## 2022-12-28 MED ORDER — FENTANYL CITRATE (PF) 100 MCG/2ML IJ SOLN
INTRAMUSCULAR | Status: AC
  Filled 2022-12-28: qty 2

## 2022-12-28 MED ORDER — PANTOPRAZOLE SODIUM 40 MG IV SOLR
40.0000 mg | Freq: Two times a day (BID) | INTRAVENOUS | Status: DC
  Administered 2022-12-28 – 2023-01-01 (×8): 40 mg via INTRAVENOUS
  Filled 2022-12-28 (×8): qty 10

## 2022-12-28 MED ORDER — SODIUM CHLORIDE 0.9 % IV SOLN: 250.0000 mL | INTRAVENOUS | Status: DC | PRN

## 2022-12-28 MED ORDER — MIDAZOLAM HCL 2 MG/2ML IJ SOLN
INTRAMUSCULAR | Status: DC | PRN
  Administered 2022-12-28: 1 mg via INTRAVENOUS

## 2022-12-28 MED ORDER — MIDAZOLAM HCL 2 MG/2ML IJ SOLN
INTRAMUSCULAR | Status: AC
  Filled 2022-12-28: qty 2

## 2022-12-28 MED ORDER — VERAPAMIL HCL 2.5 MG/ML IV SOLN: INTRAVENOUS | Status: DC | PRN

## 2022-12-28 MED ORDER — ATORVASTATIN CALCIUM 80 MG PO TABS
80.0000 mg | ORAL_TABLET | Freq: Every day | ORAL | Status: DC
  Administered 2022-12-28 – 2022-12-30 (×3): 80 mg via ORAL
  Filled 2022-12-28 (×4): qty 1

## 2022-12-28 MED ORDER — METHADONE HCL 10 MG PO TABS
10.0000 mg | ORAL_TABLET | Freq: Three times a day (TID) | ORAL | Status: DC
  Administered 2022-12-28 – 2023-01-01 (×11): 10 mg via ORAL
  Filled 2022-12-28 (×11): qty 1

## 2022-12-28 MED ORDER — HEPARIN SODIUM (PORCINE) 1000 UNIT/ML IJ SOLN
INTRAMUSCULAR | Status: AC
  Filled 2022-12-28: qty 10

## 2022-12-28 MED ORDER — SODIUM CHLORIDE 0.9% FLUSH: 3.0000 mL | INTRAVENOUS | Status: DC | PRN

## 2022-12-28 SURGICAL SUPPLY — 20 items
BALLN EMERGE MR 2.5X12 (BALLOONS) ×1
BALLN ~~LOC~~ EMERGE MR 3.5X15 (BALLOONS) ×1
BALLOON EMERGE MR 2.5X12 (BALLOONS) IMPLANT
BALLOON ~~LOC~~ EMERGE MR 3.5X15 (BALLOONS) IMPLANT
CATH 5FR JL3.5 JR4 ANG PIG MP (CATHETERS) IMPLANT
CATH LAUNCHER 6FR EBU3.5 (CATHETERS) IMPLANT
DEVICE RAD COMP TR BAND LRG (VASCULAR PRODUCTS) IMPLANT
GLIDESHEATH SLEND SS 6F .021 (SHEATH) IMPLANT
GUIDEWIRE INQWIRE 1.5J.035X260 (WIRE) IMPLANT
INQWIRE 1.5J .035X260CM (WIRE) ×1
KIT ENCORE 26 ADVANTAGE (KITS) IMPLANT
KIT HEART LEFT (KITS) ×1 IMPLANT
KIT HEMO VALVE WATCHDOG (MISCELLANEOUS) IMPLANT
PACK CARDIAC CATHETERIZATION (CUSTOM PROCEDURE TRAY) ×1 IMPLANT
SHEATH PROBE COVER 6X72 (BAG) IMPLANT
STENT SYNERGY XD 3.0X32 (Permanent Stent) IMPLANT
SYR MEDRAD MARK 7 150ML (SYRINGE) ×1 IMPLANT
TRANSDUCER W/STOPCOCK (MISCELLANEOUS) ×1 IMPLANT
TUBING CIL FLEX 10 FLL-RA (TUBING) ×1 IMPLANT
WIRE RUNTHROUGH .014X180CM (WIRE) IMPLANT
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
## 2022-12-28 NOTE — Progress Notes (Signed)
Have signed out to on call fellow to f/u pending labs and CXR result. Also paged IM for consult to aid in chronic pain and psychiatric disease.

## 2022-12-29 ENCOUNTER — Inpatient Hospital Stay (HOSPITAL_COMMUNITY): Payer: Medicare HMO | Admitting: Certified Registered"

## 2022-12-29 ENCOUNTER — Encounter (HOSPITAL_COMMUNITY)
Admission: EM | Disposition: A | Discharge status: Home / Self Care | Payer: Self-pay | Source: Home / Self Care | Attending: Internal Medicine

## 2022-12-29 ENCOUNTER — Other Ambulatory Visit: Payer: Self-pay

## 2022-12-29 ENCOUNTER — Other Ambulatory Visit (HOSPITAL_COMMUNITY): Payer: Self-pay

## 2022-12-29 ENCOUNTER — Encounter (HOSPITAL_COMMUNITY): Payer: Self-pay | Admitting: Interventional Cardiology

## 2022-12-29 ENCOUNTER — Inpatient Hospital Stay (HOSPITAL_COMMUNITY): Payer: Medicare HMO

## 2022-12-29 DIAGNOSIS — K2970 Gastritis, unspecified, without bleeding: Secondary | ICD-10-CM

## 2022-12-29 DIAGNOSIS — I2510 Atherosclerotic heart disease of native coronary artery without angina pectoris: Secondary | ICD-10-CM | POA: Diagnosis not present

## 2022-12-29 DIAGNOSIS — I213 ST elevation (STEMI) myocardial infarction of unspecified site: Secondary | ICD-10-CM

## 2022-12-29 DIAGNOSIS — I10 Essential (primary) hypertension: Secondary | ICD-10-CM

## 2022-12-29 DIAGNOSIS — F1994 Other psychoactive substance use, unspecified with psychoactive substance-induced mood disorder: Secondary | ICD-10-CM

## 2022-12-29 DIAGNOSIS — K259 Gastric ulcer, unspecified as acute or chronic, without hemorrhage or perforation: Secondary | ICD-10-CM | POA: Diagnosis not present

## 2022-12-29 DIAGNOSIS — K922 Gastrointestinal hemorrhage, unspecified: Secondary | ICD-10-CM | POA: Diagnosis not present

## 2022-12-29 DIAGNOSIS — K2289 Other specified disease of esophagus: Secondary | ICD-10-CM

## 2022-12-29 DIAGNOSIS — Z7902 Long term (current) use of antithrombotics/antiplatelets: Secondary | ICD-10-CM

## 2022-12-29 DIAGNOSIS — K3189 Other diseases of stomach and duodenum: Secondary | ICD-10-CM | POA: Diagnosis not present

## 2022-12-29 DIAGNOSIS — N179 Acute kidney failure, unspecified: Secondary | ICD-10-CM

## 2022-12-29 DIAGNOSIS — E785 Hyperlipidemia, unspecified: Secondary | ICD-10-CM

## 2022-12-29 DIAGNOSIS — K226 Gastro-esophageal laceration-hemorrhage syndrome: Secondary | ICD-10-CM | POA: Diagnosis not present

## 2022-12-29 DIAGNOSIS — I25110 Atherosclerotic heart disease of native coronary artery with unstable angina pectoris: Secondary | ICD-10-CM | POA: Diagnosis not present

## 2022-12-29 DIAGNOSIS — Z955 Presence of coronary angioplasty implant and graft: Secondary | ICD-10-CM | POA: Diagnosis not present

## 2022-12-29 DIAGNOSIS — K920 Hematemesis: Secondary | ICD-10-CM | POA: Diagnosis not present

## 2022-12-29 DIAGNOSIS — F1120 Opioid dependence, uncomplicated: Secondary | ICD-10-CM

## 2022-12-29 DIAGNOSIS — I2109 ST elevation (STEMI) myocardial infarction involving other coronary artery of anterior wall: Secondary | ICD-10-CM | POA: Diagnosis not present

## 2022-12-29 HISTORY — PX: ESOPHAGOGASTRODUODENOSCOPY: SHX5428

## 2022-12-29 HISTORY — PX: BIOPSY: SHX5522

## 2022-12-29 LAB — CBC
HCT: 41.8 % (ref 39.0–52.0)
Hemoglobin: 14.5 g/dL (ref 13.0–17.0)
MCH: 30.1 pg (ref 26.0–34.0)
MCHC: 34.7 g/dL (ref 30.0–36.0)
MCV: 86.7 fL (ref 80.0–100.0)
Platelets: 264 10*3/uL (ref 150–400)
RBC: 4.82 MIL/uL (ref 4.22–5.81)
RDW: 12.3 % (ref 11.5–15.5)
WBC: 17.1 10*3/uL — ABNORMAL HIGH (ref 4.0–10.5)
nRBC: 0 % (ref 0.0–0.2)

## 2022-12-29 LAB — BASIC METABOLIC PANEL
Anion gap: 13 (ref 5–15)
BUN: 14 mg/dL (ref 6–20)
CO2: 18 mmol/L — ABNORMAL LOW (ref 22–32)
Calcium: 8.1 mg/dL — ABNORMAL LOW (ref 8.9–10.3)
Chloride: 106 mmol/L (ref 98–111)
Creatinine, Ser: 1.11 mg/dL (ref 0.61–1.24)
GFR, Estimated: >60 mL/min (ref >60)
Glucose, Bld: 160 mg/dL — ABNORMAL HIGH (ref 70–99)
Potassium: 3.5 mmol/L (ref 3.5–5.1)
Sodium: 137 mmol/L (ref 135–145)

## 2022-12-29 LAB — PHOSPHORUS: Phosphorus: 3.7 mg/dL (ref 2.5–4.6)

## 2022-12-29 LAB — HEMOGLOBIN A1C
Hgb A1c MFr Bld: 5.3 % (ref 4.8–5.6)
Mean Plasma Glucose: 105 mg/dL

## 2022-12-29 LAB — LIPID PANEL
Cholesterol: 221 mg/dL — ABNORMAL HIGH (ref 0–200)
HDL: 33 mg/dL — ABNORMAL LOW (ref >40)
LDL Cholesterol: 165 mg/dL — ABNORMAL HIGH (ref 0–99)
Total CHOL/HDL Ratio: 6.7 RATIO
Triglycerides: 117 mg/dL (ref <150)
VLDL: 23 mg/dL (ref 0–40)

## 2022-12-29 LAB — HEMOGLOBIN AND HEMATOCRIT, BLOOD
HCT: 39.9 % (ref 39.0–52.0)
HCT: 38.9 % — ABNORMAL LOW (ref 39.0–52.0)
Hemoglobin: 13.5 g/dL (ref 13.0–17.0)
Hemoglobin: 13 g/dL (ref 13.0–17.0)

## 2022-12-29 LAB — ECHOCARDIOGRAM COMPLETE
Area-P 1/2: 4.89 cm2
Calc EF: 50.1 %
S' Lateral: 3 cm
Single Plane A2C EF: 55.1 %
Single Plane A4C EF: 46.5 %
Weight: 3040 oz

## 2022-12-29 LAB — MAGNESIUM: Magnesium: 2.5 mg/dL — ABNORMAL HIGH (ref 1.7–2.4)

## 2022-12-29 LAB — IRON AND TIBC
Iron: 48 ug/dL (ref 45–182)
Saturation Ratios: 21 % (ref 17.9–39.5)
TIBC: 231 ug/dL — ABNORMAL LOW (ref 250–450)
UIBC: 183 ug/dL

## 2022-12-29 LAB — GLUCOSE, CAPILLARY: Glucose-Capillary: 108 mg/dL — ABNORMAL HIGH (ref 70–99)

## 2022-12-29 LAB — LACTIC ACID, PLASMA: Lactic Acid, Venous: 1.6 mmol/L (ref 0.5–1.9)

## 2022-12-29 LAB — FERRITIN: Ferritin: 419 ng/mL — ABNORMAL HIGH (ref 24–336)

## 2022-12-29 LAB — TROPONIN I (HIGH SENSITIVITY): Troponin I (High Sensitivity): 1521 ng/L (ref <18)

## 2022-12-29 SURGERY — EGD (ESOPHAGOGASTRODUODENOSCOPY)
Anesthesia: Monitor Anesthesia Care

## 2022-12-29 MED ORDER — LIDOCAINE 2% (20 MG/ML) 5 ML SYRINGE
INTRAMUSCULAR | Status: DC | PRN
  Administered 2022-12-29: 40 mg via INTRAVENOUS

## 2022-12-29 MED ORDER — SODIUM CHLORIDE 0.9 % IV SOLN: INTRAVENOUS | Status: AC

## 2022-12-29 MED ORDER — SUCRALFATE 1 G PO TABS
1.0000 g | ORAL_TABLET | Freq: Three times a day (TID) | ORAL | Status: DC
  Administered 2022-12-29 – 2023-01-01 (×11): 1 g via ORAL
  Filled 2022-12-29 (×16): qty 1

## 2022-12-29 MED ORDER — METOPROLOL TARTRATE 12.5 MG HALF TABLET
12.5000 mg | ORAL_TABLET | Freq: Two times a day (BID) | ORAL | Status: DC
  Administered 2022-12-29 – 2023-01-01 (×7): 12.5 mg via ORAL
  Filled 2022-12-29 (×7): qty 1

## 2022-12-29 MED ORDER — PHENYLEPHRINE HCL (PRESSORS) 10 MG/ML IV SOLN
INTRAVENOUS | Status: DC | PRN
  Administered 2022-12-29: 160 ug via INTRAVENOUS

## 2022-12-29 MED ORDER — SODIUM CHLORIDE 0.9 % IV SOLN: INTRAVENOUS | Status: DC

## 2022-12-29 MED ORDER — BUTAMBEN-TETRACAINE-BENZOCAINE 2-2-14 % EX AERO
INHALATION_SPRAY | CUTANEOUS | Status: DC | PRN
  Administered 2022-12-29: 1 via TOPICAL

## 2022-12-29 MED ORDER — POTASSIUM CHLORIDE CRYS ER 20 MEQ PO TBCR
40.0000 meq | EXTENDED_RELEASE_TABLET | Freq: Two times a day (BID) | ORAL | Status: AC
  Administered 2022-12-29 – 2022-12-30 (×3): 40 meq via ORAL
  Filled 2022-12-29 (×3): qty 2

## 2022-12-29 MED ORDER — LACTATED RINGERS IV SOLN: INTRAVENOUS | Status: DC | PRN

## 2022-12-29 MED ORDER — PROPOFOL 10 MG/ML IV BOLUS
INTRAVENOUS | Status: DC | PRN
  Administered 2022-12-29: 70 mg via INTRAVENOUS
  Administered 2022-12-29: 30 mg via INTRAVENOUS
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
## 2022-12-29 NOTE — TOC Benefit Eligibility Note (Signed)
Patient Advocate Encounter  Insurance verification completed.    The patient is currently admitted and upon discharge could be taking Brilinta 90 mg.  The current 30 day co-pay is $45.00.   The patient is insured through [employer] Medicare Part D   This test claim was processed through [facility]- copay amounts may vary at other pharmacies due to pharmacy/plan contracts, or as the patient moves through the different stages of their insurance plan.  [name] [name], CPHT Pharmacy Patient Advocate Specialist [facility] Patient Advocate Team Direct Number: [phone]  Fax: [phone]       

## 2022-12-29 NOTE — Interval H&P Note (Signed)
History and Physical Interval Note:  12/29/2022 12:40 PM  [name] [name]  has presented today for surgery, with the diagnosis of hematemesis, chronic anticoagultion.  The various methods of treatment have been discussed with the patient and family. After consideration of risks, benefits and other options for treatment, the patient has consented to  Procedure(s): ESOPHAGOGASTRODUODENOSCOPY (EGD) (N/A) as a surgical intervention.  The patient's history has been reviewed, patient examined, no change in status, stable for surgery.  I have reviewed the patient's chart and labs.  Questions were answered to the patient's satisfaction.     [employer]

## 2022-12-29 NOTE — TOC Initial Note (Signed)
Transition of Care [facility]) - Initial/Assessment Note    Patient Details  Name: [name] [name] MRN: [id number] Date of Birth: Jun 09, 1966  Transition of Care [facility]) CM/SW Contact:    Delilah Shan, LCSWA Phone Number: 12/29/2022, 3:22 PM  Clinical Narrative:                  CSW spoke with patient at bedside. Patient reports PTA he comes from home alone. CSW received consult for patient. CSW offered patient outpatient substance use treatment services resources. Patient politely declined. Patient reports he will need transportation assistance when medically ready for dc. All questions answered. No further questions reported at this time. TOC will continue to follow and assist with patients dc planning needs.       Patient Goals and CMS Choice            Expected Discharge Plan and Services                                              Prior Living Arrangements/Services                       Activities of Daily Living      Permission Sought/Granted                  Emotional Assessment              Admission diagnosis:  Acute anterior wall MI [facility]) [I21.09] Patient Active Problem List   Diagnosis Date Noted   Coronary artery disease involving native coronary artery of native heart with unstable angina pectoris (HCC) 12/29/2022   Presence of drug coated stent in LAD coronary artery 12/29/2022   Acute upper GI bleeding 12/29/2022   ST elevation myocardial infarction (STEMI) (HCC) 12/29/2022   Hematemesis with nausea 12/29/2022   Antiplatelet or antithrombotic long-term use 12/29/2022   Acute ST elevation myocardial infarction (STEMI) of anterior wall (HCC) 12/28/2022   Substance-induced psychotic disorder with hallucinations (HCC) 08/25/2022   DOE (dyspnea on exertion) 10/11/2019   COPD GOLD 0 10/11/2019   Essential hypertension 10/11/2019   Morbid obesity due to excess calories (HCC) 10/11/2019   Esophageal dysphagia 01/03/2017    Encounter for screening colonoscopy 01/03/2017   Substance induced mood disorder (HCC) 09/20/2016   Hyperprolactinemia (HCC) 03/14/2015   Bipolar 1 disorder, mixed, moderate (HCC) 03/12/2015   Opiate dependence, continuous (HCC) 03/12/2015   Cocaine use disorder, moderate, in sustained remission (HCC) 03/12/2015   Acute renal failure (HCC) 02/17/2012   Rhabdomyolysis 02/13/2012   OVERWEIGHT 01/24/2009   ARACHNOIDITIS 09/27/2008   MALAISE AND FATIGUE 09/27/2008   Hyperlipidemia with target LDL less than 70 07/01/2008   BIPOLAR AFFECTIVE DISORDER 07/01/2008   MIGRAINE HEADACHE 07/01/2008   GERD (gastroesophageal reflux disease) 07/01/2008   ARTHRITIS 07/01/2008   DEGENERATIVE DISC DISEASE 07/01/2008   LOW BACK PAIN, CHRONIC 07/01/2008   PCP:  Benetta Spar, MD Pharmacy:   [facility] [place] [place] - [address] [place] [address] Phone: [phone] Fax: [phone]     Social Determinants of Health (SDOH) Social History: SDOH Screenings   Food Insecurity: No Food Insecurity (03/11/2022)  Housing: Low Risk  (03/11/2022)  Transportation Needs: No Transportation Needs (03/11/2022)  Alcohol Screen: Low Risk  (03/11/2022)  Depression (PHQ2-9): Low Risk  (03/11/2022)  Financial Resource Strain:  Low Risk  (03/11/2022)  Physical Activity: Inactive (03/11/2022)  Social Connections: Moderately Isolated (03/11/2022)  Stress: No Stress Concern Present (03/11/2022)  Tobacco Use: Medium Risk (12/29/2022)   SDOH Interventions:     Readmission Risk Interventions     No data to display
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
## 2022-12-29 NOTE — Anesthesia Postprocedure Evaluation (Signed)
Anesthesia Post Note  Patient: [name] [name]  Procedure(s) Performed: ESOPHAGOGASTRODUODENOSCOPY (EGD) BIOPSY     Patient location during evaluation: PACU Anesthesia Type: MAC Level of consciousness: awake and alert Pain management: pain level controlled Vital Signs Assessment: post-procedure vital signs reviewed and stable Respiratory status: spontaneous breathing, nonlabored ventilation, respiratory function stable and patient connected to nasal cannula oxygen Cardiovascular status: stable and blood pressure returned to baseline Postop Assessment: no apparent nausea or vomiting Anesthetic complications: no   No notable events documented.  Last Vitals:  Vitals:   12/29/22 1500 12/29/22 1600  BP: 113/60 112/70  Pulse: 65 75  Resp: 13 19  Temp:    SpO2: 100% 99%    Last Pain:  Vitals:   12/29/22 1405  TempSrc: Temporal  PainSc: 6                  Latajah Thuman P Mileena Rothenberger

## 2022-12-29 NOTE — Transfer of Care (Signed)
Immediate Anesthesia Transfer of Care Note  Patient: [name] [name]  Procedure(s) Performed: ESOPHAGOGASTRODUODENOSCOPY (EGD) BIOPSY  Patient Location: Endoscopy Unit  Anesthesia Type:MAC  Level of Consciousness: awake, drowsy, patient cooperative, and responds to stimulation  Airway & Oxygen Therapy: Patient Spontanous Breathing  Post-op Assessment: Report given to RN and Post -op Vital signs reviewed and stable  Post vital signs: Reviewed and stable  Last Vitals:  Vitals Value Taken Time  BP    Temp    Pulse 68 12/29/22 1345  Resp 16 12/29/22 1345  SpO2 95 % 12/29/22 1345  Vitals shown include unvalidated device data.  Last Pain:  Vitals:   12/29/22 1234  TempSrc: Temporal  PainSc: 0-No pain      Patients Stated Pain Goal: 0 (12/29/22 1200)  Complications: No notable events documented.

## 2022-12-29 NOTE — Anesthesia Preprocedure Evaluation (Addendum)
Anesthesia Evaluation  Patient identified by MRN, date of birth, ID band Patient awake    Reviewed: Allergy & Precautions, NPO status , Patient's Chart, lab work & pertinent test results  Airway Mallampati: II  TM Distance: >3 FB Neck ROM: Full    Dental  (+) Teeth Intact, Dental Advisory Given   Pulmonary sleep apnea , COPD, former smoker   Pulmonary exam normal breath sounds clear to auscultation       Cardiovascular hypertension, + angina  + CAD, + Past MI, + Cardiac Stents and + DOE  Normal cardiovascular exam Rhythm:Regular Rate:Normal     Neuro/Psych  Headaches PSYCHIATRIC DISORDERS  Depression Bipolar Disorder    Neuromuscular disease    GI/Hepatic ,GERD  ,,(+)     substance abuse  cocaine use and marijuana use hematemesis   Endo/Other  negative endocrine ROS    Renal/GU negative Renal ROS     Musculoskeletal  (+) Arthritis ,  Fibromyalgia -  Abdominal   Peds  Hematology negative hematology ROS (+)   Anesthesia Other Findings Day of surgery medications reviewed with the patient.  Reproductive/Obstetrics                             Anesthesia Physical Anesthesia Plan  ASA: 4  Anesthesia Plan: MAC   Post-op Pain Management: Minimal or no pain anticipated   Induction: Intravenous  PONV Risk Score and Plan: 1 and TIVA and Treatment may vary due to age or medical condition  Airway Management Planned: Natural Airway and Simple Face Mask  Additional Equipment:   Intra-op Plan:   Post-operative Plan:   Informed Consent: I have reviewed the patients History and Physical, chart, labs and discussed the procedure including the risks, benefits and alternatives for the proposed anesthesia with the patient or authorized representative who has indicated his/her understanding and acceptance.     Dental advisory given  Plan Discussed with: CRNA  Anesthesia Plan Comments:         Anesthesia Quick Evaluation

## 2022-12-29 NOTE — Op Note (Signed)
[facility] Patient Name: [name] [name] Procedure Date : 12/29/2022 MRN: [id number] Attending MD: Corliss Parish , MD, [id number] Date of Birth: Dec 21, 1965 CSN: [id number] Age: 57 Admit Type: Inpatient Procedure:                Upper GI endoscopy Indications:              Coffee-ground emesis, Hematemesis Providers:                Corliss Parish, MD, Fransisca Connors, Melany Guernsey, Technician Referring MD:             Inpatient medical service, Gennette Pac, MD Medicines:                Monitored Anesthesia Care Complications:            No immediate complications. Estimated Blood Loss:     Estimated blood loss was minimal. Procedure:                Pre-Anesthesia Assessment:                           - Prior to the procedure, a History and Physical                            was performed, and patient medications and                            allergies were reviewed. The patient's tolerance of                            previous anesthesia was also reviewed. The risks                            and benefits of the procedure and the sedation                            options and risks were discussed with the patient.                            All questions were answered, and informed consent                            was obtained. Prior Anticoagulants: The patient                            last took Brilinta (ticagrelor) on the day of the                            procedure and has taken no anticoagulant or                            antiplatelet agents except for aspirin. ASA Grade  Assessment: III - A patient with severe systemic                            disease. After reviewing the risks and benefits,                            the patient was deemed in satisfactory condition to                            undergo the procedure.                           After obtaining informed consent,  the endoscope was                            passed under direct vision. Throughout the                            procedure, the patient's blood pressure, pulse, and                            oxygen saturations were monitored continuously. The                            [id number]-[id number] ([id number]) Olympus endoscope was introduced                            through the mouth, and advanced to the second part                            of duodenum. The upper GI endoscopy was                            accomplished without difficulty. The patient                            tolerated the procedure. Scope In: Scope Out: Findings:      No gross lesions were noted in the majority of the esophagus.      A small non-bleeding Mallory-Weiss tear with no stigmata of recent       bleeding was found at the GE junction.      The Z-line was irregular and was found 39 cm from the incisors.      Diffuse severe mucosal changes characterized by congestion,       discoloration, erythema, friability (with contact bleeding),       granularity, inflammation, linear erosions, altered texture and areas of       shallow ulceration were found in the entire examined stomach. This       appearance is most consistent with ischemic changes endoscopically more       so than H. pylori however biopsies were taken with a cold forceps for       histology purposes.      Localized moderate mucosal changes characterized by congestion, erythema       and granularity were found in the duodenal bulb and in the first  portion       of the duodenum. Biopsies were taken with a cold forceps for histology.      No other gross lesions were noted in the second portion of the duodenum. Impression:               - No gross lesions in the majority of the esophagus.                           - Small Mallory-Weiss tear at the GE junction.                           - Z-line irregular, 39 cm from the incisors.                           - Congested,  discolored, erythematous, friable                            (with contact bleeding), granular, inflamed,                            linearly eroded, texture changed and ulcerated                            mucosa in the stomach. Biopsied.                           - Mucosal changes in the duodenal bulb (biopsied).                            No other gross lesions in the second portion of the                            duodenum. Recommendation:           - The patient will be observed post-procedure,                            until all discharge criteria are met.                           - Return patient to hospital ward for ongoing care.                           - Advance diet as tolerated.                           - Continue IV PPI twice daily for 48 more hours.                            Then transition to p.o. PPI twice daily thereafter.                           - Carafate 1 g 4 times daily for 2 months and then  twice daily for 2 months.                           - Await pathology results.                           - Observe patient's clinical course.                           - Consider repeat endoscopy in 4 to 6 months                            (probably okay for patient to remain on Brilinta if                            necessary?"which I expect would be the case with                            recent DES). Can discuss this with Dr. Jena Gauss his                            primary gastroenterologist.                           - The findings and recommendations were discussed                            with the patient.                           - The findings and recommendations were discussed                            with the referring physician. Procedure Code(s):        --- Professional ---                           [phone], Esophagogastroduodenoscopy, flexible,                            transoral; with biopsy, single or multiple Diagnosis  Code(s):        --- Professional ---                           K22.6, Gastro-esophageal laceration-hemorrhage                            syndrome                           K22.89, Other specified disease of esophagus                           K92.2, Gastrointestinal hemorrhage, unspecified                           K29.70, Gastritis, unspecified, without  bleeding                           K31.89, Other diseases of stomach and duodenum                           K25.9, Gastric ulcer, unspecified as acute or                            chronic, without hemorrhage or perforation                           K92.0, Hematemesis CPT copyright 2022 [employer]. All rights reserved. The codes documented in this report are preliminary and upon coder review may  be revised to meet current compliance requirements. Corliss Parish, MD 12/29/2022 2:14:31 PM Number of Addenda: 0

## 2022-12-29 NOTE — Progress Notes (Signed)
Echocardiogram 2D Echocardiogram has been performed.  Warren Lacy Xaria Judon RDCS 12/29/2022, 11:45 AM

## 2022-12-29 NOTE — Progress Notes (Signed)
Rounding Note    Patient Name: [name] [name] Date of Encounter: 12/29/2022  [facility] Cardiologist: None new  Patient Profile     57 y.o. male with schizophrenia, history of PSA (cocaine), chronic pain/opioid dependence, bipolar with substance abuse induced mood disorder, HLD, possible sleep apnea admitted 12/28/2022 with anterior STEMI. Apparently he tried to call 911 with the onset of symptoms feeling tired and fatigued on 12/20/2022.  The call would not go through so he went to his neighbor's house did not call a door.  Apparently the neighbor witnessed him pass out and then went away on the following to the ditch.  Symptoms were shoulder pain and possible syncope.  Concern for late presenting MI. Internal medicine consulted due to concerns for psychiatric issues and polysubstance abuse.  Subjective   Over night - 2 episodes of coffeee ground emesis.   Assessment & Plan    Principal Problem:   Acute ST elevation myocardial infarction (STEMI) of anterior wall (HCC) Active Problems:   Coronary artery disease involving native coronary artery of native heart with unstable angina pectoris (HCC)   Presence of drug coated stent in LAD coronary artery   Hyperlipidemia with target LDL less than 70   Acute renal failure (HCC)   Essential hypertension   BIPOLAR AFFECTIVE DISORDER   Substance induced mood disorder (HCC)   Opiate dependence, continuous (HCC)  Principal Problem:   Acute ST elevation myocardial infarction (STEMI) of anterior wall (HCC) / Coronary artery disease involving native coronary artery of native heart with unstable angina pectoris (HCC) =>   Presence of drug coated stent in LAD coronary artery Found to have severe proximal to mid LAD disease treated with DES stent.  Also has significant RCA and LCx disease as noted.  The upstream LCx lesion is not all that significant and unless there is significant ongoing pain would probably avoid further evaluation  for now.  Can assess symptoms as we go. Echo Pending - but EF on LVGram appeared normal  DAPT x 1 year minimum.  - but with GI bleed we will stop aspirin. High-dose high intensity statin Will start low dose Beta Blocker 12.5 mg Lopressor    Hyperlipidemia with target LDL less than 70 Statin converted from Simvastatin 40 mg  to Atorvastatin 80 mg daily => He is having significant shoulder stiffness and pain in the day.  I do not think this is related to just recently switching him to atorvastatin, but will need to monitor.  Would ask that the [facility] team assist with addressing this discomfort.    Essential hypertension Not on any meds PTA - will      Acute renal failure (HCC) creatinine on arrival was 1.6 now down to 1.1.  Resolved.   Monitor.  Likely related to dehydration.     BIPOLAR AFFECTIVE DISORDER / Substance induced mood disorder (HCC) /   Opiate dependence, continuous (HCC) Per [facility] Patient denies drug any recent use On Methadone for maintenance -- continue home dose.     Acute GI Bleed -- 2 episodes of coffee Ground emesis over night - after PCI => started on IV PPI & GI consult seeing now.   Hgb this AM down ~ 1.5 units -- suspect some of this is post-procedure.  H/o esophagitis. -- plan Dx EGD later today will hold ASA & continue Brlinta monotherapy  Hypokalemia -- K 3.5 - will hold off on PO repletion until post EGD - give 40 mEq. X 1 today &  reassess in AM Leukocytosis -- I suspect that this is likely relate to MI - A-febrile.   Bilateral shoulder pain, very difficult to move his shoulders up over his head.  He has a history of significant neuropathy pains which for which she takes methadone, but this is a new thing for him.  Cannot think of a cardiac etiology of that would explain this.  It may just simply be musculoskeletal stiffness.  Will start with heating pads and mobilization.  But will defer to [facility] for further management.  Appreciate [facility] Medicine Assistance --    Will keep in the ICU today since he is pending EGD.  Would probably be transfer out later this evening after procedure or in the morning.  I do not think that he is a fast-track discharge candidate, but if tomorrow is a stable day would anticipate discharge the following day.   Inpatient Medications    Scheduled Meds:  aspirin  81 mg Oral Daily   atorvastatin  80 mg Oral Daily   Chlorhexidine Gluconate Cloth  6 each Topical Daily   docusate sodium  400 mg Oral Daily   methadone  10 mg Oral Q8H   pantoprazole (PROTONIX) IV  40 mg Intravenous BID   sodium chloride flush  3 mL Intravenous Q12H   ticagrelor  90 mg Oral BID   Continuous Infusions:  sodium chloride     sodium chloride     sodium chloride 50 mL/hr at 12/29/22 0621   PRN Meds: sodium chloride, acetaminophen, ondansetron (ZOFRAN) IV, oxyCODONE-acetaminophen **AND** oxyCODONE, sodium chloride flush   Vital Signs    Vitals:   12/29/22 0300 12/29/22 0400 12/29/22 0500 12/29/22 0600  BP: 112/81 115/86 106/79 109/80  Pulse: (!) 106 88 86 87  Resp: (!) 28 14 13 13   Temp:      TempSrc:      SpO2: 91% 94% 93% 93%  Weight:        Intake/Output Summary (Last 24 hours) at 12/29/2022 0727 Last data filed at 12/29/2022 0600 Gross per 24 hour  Intake 735.73 ml  Output 1125 ml  Net -389.27 ml      12/28/2022    6:48 PM 08/24/2022    5:35 AM 12/21/2021   11:36 AM  Last 3 Weights  Weight (lbs) 190 lb 190 lb 210 lb  Weight (kg) 86.183 kg 86.183 kg 95.255 kg      Telemetry    Sinus rhythm with PVCs-rates in the 80s.- Personally Reviewed  ECG    Sinus rhythm, rate 89 bpm.  Persistent ST elevation with T wave inversions in V2 and V3, less prominent in V4 and V5.  Only subtle ST elevations remain in V5, V6 as well as I, II, and aVL-suspect evolutionary changes of large anteroseptal MI-personally Reviewed, not all that different, compared to previous EKG.   Cardiac Studies   Cardiac Cath 12/28/2022: Acute anterior  STEMI-thrombotic mid LAD 99% (DES PCI Synergy XD 3.0 32 postdilated to 3.5 mm -> reduced to 0%.  TIMI-2 flow pre and TIMI-3 flow post)..  Proximal RCA 100% occluded with brisk bridging collaterals from proximal to mid RCA filling the entire RCA system, proximal LCx 50% and then mid LCx 100% CTO after OM 1 -> at least 2 additional OM branches filling via right to left and left to left collaterals.  D1 25%, D2 70%.  EF 55 to 65%.  Normal wall motion.  Normal EDP. Recommendations: DAPT for minimum 12 months.;  Increased intensity  statin.  Consider reevaluation of other disease, depending on symptoms. Diagnostic        Intervention Dominance: Right     Physical Exam   GEN: No acute distress.   Neck: No JVD Cardiac: RRR, no murmurs, rubs, or gallops.  Respiratory: Clear to auscultation bilaterally. GI: Soft, mild abdominal tenderness.  Nondistended MS: No edema; No deformity.  Has significant pain with trying to raise his arms above his head.  Even with passive motion he has significant pain. -No swelling or tenderness. Neuro:  Nonfocal  Psych: Somewhat unusual affect.  Seems to be almost scared.  Labs    High Sensitivity Troponin:   Recent Labs  Lab 12/28/22 1831 12/28/22 2242  TROPONINIHS 916* 1,521*     Chemistry Recent Labs  Lab 12/28/22 1831 12/28/22 1900 12/29/22 0154  NA 138 141 137  K 4.2 4.0 3.5  CL 106 107 106  CO2 14*  --  18*  GLUCOSE 151* 129* 160*  BUN 12 12 14   CREATININE 1.60* 1.40* 1.11  CALCIUM 8.7*  --  8.1*  MG  --   --  2.5*  PROT 6.5  --   --   ALBUMIN 3.9  --   --   AST 39  --   --   ALT 27  --   --   ALKPHOS 57  --   --   BILITOT 0.9  --   --   GFRNONAA 50*  --  >60  ANIONGAP 18*  --  13    Lipids  Recent Labs  Lab 12/29/22 0154  CHOL 221*  TRIG 117  HDL 33*  LDLCALC 165*  CHOLHDL 6.7    Hematology Recent Labs  Lab 12/28/22 1831 12/28/22 1900 12/28/22 2242 12/29/22 0154  WBC 15.4*  --  23.8* 17.1*  RBC 5.32  --  5.41 4.82  HGB  16.1 15.6 16.1 14.5  HCT 47.1 46.0 46.6 41.8  MCV 88.5  --  86.1 86.7  MCH 30.3  --  29.8 30.1  MCHC 34.2  --  34.5 34.7  RDW 12.0  --  12.3 12.3  PLT 277  --  267 264   Thyroid No results for input(s): "TSH", "FREET4" in the last 168 hours.  BNPNo results for input(s): "BNP", "PROBNP" in the last 168 hours.  DDimer No results for input(s): "DDIMER" in the last 168 hours.   Radiology    DG Chest Port 1 View  Result Date: 12/28/2022 CLINICAL DATA:  Chest pain EXAM: PORTABLE CHEST 1 VIEW COMPARISON:  06/25/2021 FINDINGS: The heart size and mediastinal contours are within normal limits. Both lungs are clear. The visualized skeletal structures are unremarkable. IMPRESSION: Normal study. Electronically Signed   By: Charlett Nose M.D.   On: 12/28/2022 20:13   CARDIAC CATHETERIZATION  Result Date: 12/28/2022   Prox RCA lesion is 100% stenosed.  Bridging collaterals from proximal RCA to mid RCA.   Mid Cx lesion is 100% stenosed.  Left to left collaterals.  Prior to the mid vessel occlusion, three is a large OM branch.  Unable to cross with workhorse wire.   1st Diag lesion is 25% stenosed.   2nd Diag lesion is 70% stenosed.   Prox Cx lesion is 50% stenosed.   Mid LAD lesion is 99% stenosed.  A drug-eluting stent was successfully placed using a STENT SYNERGY XD 3.0X32, postdilated to 3.5 mm.   Post intervention, there is a 0% residual stenosis.   The left ventricular systolic function  is normal.   LV end diastolic pressure is normal.   The left ventricular ejection fraction is 55-65% by visual estimate.   There is no aortic valve stenosis. Acute anterior MI due to thrombotic mid LAD lesion.  Successfully treated with drug-eluting stent.  Dual antiplatelet therapy for at least 12 months.  Increase intensity of statin.  I stressed the importance of compliance with his medication.  Other residual disease will require aggressive medical therapy.  Depending on symptoms, will consider repeat catheterization to  address the other disease. Results conveyed to [name] [phone]        For questions or updates, please contact [facility] Please consult [email or web address] for contact info under        Signed, Bryan Lemma, MD  12/29/2022, 7:27 AM

## 2022-12-29 NOTE — Progress Notes (Signed)
PROGRESS NOTE [name] [name]  UJW:[id number] DOB: 1966-07-25 DOA: 12/28/2022 PCP: Benetta Spar, MD  Brief Narrative/Hospital Course: 57 yyom w/ chronic  b/l shoulder pain/chronic pain syndrome on methadone and oxycodone, bipolar 1 disorder not on any psych medications, polysubstance abuse including cocaine/THC, hyperlipidemia, medication noncompliance, who presented to [facility] ED as a code STEMI on 12/28/22 by [facility], picked up near his house as he was knocking on his neighbors door,reportedly he passed out, ran away, and fell into a ditch. He thought somebody was breaking into his house. His EKG was concerning for ST elevation MI.  Code STEMI was called and the patient was brought into the ED at [facility].  He received a full dose aspirin en route and 1.5 L NS IV fluid bolus via EMS.  4/28> S/p LHC- findings were concerning for acute anterior MI due to thrombotic mid LAD lesion treated with drug-eluting stent and was started on DAPT on 12/28/2022, to continue for at least 12 months. Due to history of bipolar disorder and chronic pain syndrome, TRH, hospitalist service, was asked to see in consultation and to assist with the management of his a forementioned chronic medical conditions. 4/28>>he had an episode of ground coffee emesis about a large cup full- started BID IV Protonix, GI was consulted    Subjective: Patient seen and examined, he is alert awake oriented x 4 Denies suicidal ideation homicidal ideation Complains of chronic pain including pain on his shoulder heating pad on Overnight afebrile Labs with AKI improved 1.1, LDL 165 normal iron studies leukocytosis improving 71 Waiting for EGD today  Assessment and Plan: Principal Problem:   Acute ST elevation myocardial infarction (STEMI) of anterior wall (HCC) Active Problems:   Coronary artery disease involving native coronary artery of native heart with unstable angina pectoris (HCC)   Presence of drug coated  stent in LAD coronary artery   Acute upper GI bleeding   Hyperlipidemia with target LDL less than 70   BIPOLAR AFFECTIVE DISORDER   Acute renal failure (HCC)   Opiate dependence, continuous (HCC)   Substance induced mood disorder (HCC)   Essential hypertension  Acute STEMI of ant wall:due to thrombotic mid LAD lesion: s/p lhc  DES- planning for DAPT x 12 m-Due to concern for GI bleeding stopping aspirin pending EGD, cont high intensity statin.  Added Lopressor 12.5 mg twice daily. Cont education on compliance and aggressive risk factor modification.  See Cath report below.   Bipolar 1 disorder: Off psych medication over a year, has not followed up with psych, declined psych consultation here, will need outpatient follow-up.  Currently he is AAOx3 denies suicidal ideation homicidal ideation or hallucination, agreeable with medical treatment for his MI  Chronic shoulder pain Chronic pain syndrome: on methadone and oxycodone, continue the same, continue vomiting blood, symptomatic management Add Robaxin.   Acute metabolic encephalopathy, delirium Complicated by polysubstance abuse including THC and cocaine. Currently resolved alert and oriented x 3 follows commands appropriately    Polysubstance abuse including THC and cocaine: cessation counseling done 5/28- denied recent use of polysubstance.Denies use of alcohol but UDS positive for Benzo, THC, and cocaine. TOC consulted  Coffee-ground emesis 5/28 Upper GI bleed with history of esophagitis seen on upper endoscopy done in 2018: Holding aspirin for now, continue PPI twice daily GI following for EGD today.  Hemoglobin remains stable   AKI-resolved High anion gap metabolic acidosis in the setting of acute renal insufficiency-bicarb improving at 18, monitor, continue oral hydration's and IV  fluids   Leukocytosis, possibly reactive: Chest x-ray was nonacute, UA is pending-but no fever holding off on antibiotic leukocytosis downtrending    HFpEF 55-65 from cardiac cath on 12/20/2022  DVT prophylaxis:  Code Status:   Code Status: Full Code Family Communication: plan of care discussed with patien  at bedside. Patient status is: Inpatient because of GI bleed, STEMI Level of care: ICU > hopefully transfer out of ICU today or tomorrow morning.  Will take over as primary due to his ongoing other noncardiac issues per request from Dr. Herbie Baltimore.  Dispo: The patient is from: home            Anticipated disposition: home 1-2 days Objective: Vitals last 24 hrs: Vitals:   12/29/22 0850 12/29/22 0900 12/29/22 0945 12/29/22 1000  BP:  114/79 110/79 109/79  Pulse:  88 82 80  Resp:  11  11  Temp: 98.7 F (37.1 C)     TempSrc: Oral     SpO2:  94%  96%  Weight:       Weight change:   Physical Examination:  General exam: alert awake oriented x 3 HEENT:Oral mucosa moist, Ear/Nose WNL grossly Respiratory system: bilaterally clear BS, no use of accessory muscle Cardiovascular system: S1 & S2 +, No JVD. Gastrointestinal system: Abdomen soft,NT,ND, BS+ Nervous System:Alert, awake, moving extremities. Extremities: LE edema neg,distal peripheral pulses palpable.  Shoulders tender on exam. Skin: No rashes,no icterus. MSK: Normal muscle bulk,tone, power  Medications reviewed: Scheduled Meds:  atorvastatin  80 mg Oral Daily   Chlorhexidine Gluconate Cloth  6 each Topical Daily   docusate sodium  400 mg Oral Daily   methadone  10 mg Oral Q8H   metoprolol tartrate  12.5 mg Oral BID   pantoprazole (PROTONIX) IV  40 mg Intravenous BID   potassium chloride  40 mEq Oral BID   sodium chloride flush  3 mL Intravenous Q12H   ticagrelor  90 mg Oral BID  Continuous Infusions:  sodium chloride     sodium chloride     sodium chloride 50 mL/hr at 12/29/22 1000   sodium chloride      Diet Order             Diet NPO time specified  Diet effective now                  Intake/Output Summary (Last 24 hours) at 12/29/2022 1117 Last data  filed at 12/29/2022 1000 Gross per 24 hour  Intake 935.67 ml  Output 1125 ml  Net -189.33 ml  Net IO Since Admission: -189.33 mL [12/29/22 1117]  Wt Readings from Last 3 Encounters:  12/28/22 86.2 kg  08/24/22 86.2 kg  12/21/21 95.3 kg   Unresulted Labs (From admission, onward)     Start     Ordered   12/29/22 1300  Hemoglobin and hematocrit, blood  Now then every 12 hours,   R (with TIMED occurrences)     Question:  Specimen collection method  Answer:  Lab=Lab collect   12/29/22 1109   12/29/22 0500  Lipoprotein A (LPA)  Tomorrow morning,   R        12/28/22 1952   12/29/22 0042  Urinalysis, Routine w reflex microscopic -Urine, Clean Catch  Once,   R       Question:  Specimen Source  Answer:  Urine, Clean Catch   12/29/22 0041   12/28/22 2321  Hemoglobin and hematocrit, blood  Now then every 6 hours,   R (  with TIMED occurrences),   Status:  Canceled      12/28/22 2321   12/28/22 2136  Occult blood card to lab, stool  Once,   R        12/28/22 2135   12/28/22 1832  Rapid urine drug screen (hospital performed)  ONCE - STAT,   STAT        12/28/22 1831          Data Reviewed: I have personally reviewed following labs and imaging studies CBC: Recent Labs  Lab 12/28/22 1831 12/28/22 1900 12/28/22 2242 12/29/22 0154  WBC 15.4*  --  23.8* 17.1*  NEUTROABS 13.5*  --   --   --   HGB 16.1 15.6 16.1 14.5  HCT 47.1 46.0 46.6 41.8  MCV 88.5  --  86.1 86.7  PLT 277  --  267 264   Basic Metabolic Panel: Recent Labs  Lab 12/28/22 1831 12/28/22 1900 12/29/22 0154  NA 138 141 137  K 4.2 4.0 3.5  CL 106 107 106  CO2 14*  --  18*  GLUCOSE 151* 129* 160*  BUN 12 12 14   CREATININE 1.60* 1.40* 1.11  CALCIUM 8.7*  --  8.1*  MG  --   --  2.5*  PHOS  --   --  3.7   GFR: Estimated Creatinine Clearance: 77.9 mL/min (by C-G formula based on SCr of 1.11 mg/dL). Liver Function Tests: Recent Labs  Lab 12/28/22 1831  AST 39  ALT 27  ALKPHOS 57  BILITOT 0.9  PROT 6.5   ALBUMIN 3.9   Recent Labs  Lab 12/28/22 1831  INR 1.2   Recent Labs    12/28/22 1831  HGBA1C 5.3   Recent Labs    12/28/22 1831 12/29/22 0154  CHOL 251* 221*  HDL 37* 33*  LDLCALC 186* 161*  TRIG 139 117  CHOLHDL 6.8 6.7   Recent Labs  Lab 12/29/22 0155  LATICACIDVEN 1.6   Recent Results (from the past 240 hour(s))  MRSA Next Gen by PCR, Nasal     Status: None   Collection Time: 12/28/22  7:52 PM   Specimen: Nasal Swab  Result Value Ref Range Status   MRSA by PCR Next Gen NOT DETECTED NOT DETECTED Final    Comment: (NOTE) The GeneXpert MRSA Assay (FDA approved for NASAL specimens only), is one component of a comprehensive MRSA colonization surveillance program. It is not intended to diagnose MRSA infection nor to guide or monitor treatment for MRSA infections. Test performance is not FDA approved in patients less than 65 years old. Performed at [facility], [address], [place], [place] [address]   Antimicrobials: Anti-infectives (From admission, onward)    None      Culture/Microbiology    Component Value Date/Time   SDES URINE, CLEAN CATCH 02/13/2012 1620   SPECREQUEST NONE 02/13/2012 1620   CULT NO GROWTH 02/13/2012 1620   REPTSTATUS 02/15/2012 FINAL 02/13/2012 1620   Radiology Studies: [facility] Chest Port 1 View  Result Date: 12/28/2022 CLINICAL DATA:  Chest pain EXAM: PORTABLE CHEST 1 VIEW COMPARISON:  06/25/2021 FINDINGS: The heart size and mediastinal contours are within normal limits. Both lungs are clear. The visualized skeletal structures are unremarkable. IMPRESSION: Normal study. Electronically Signed   By: Charlett Nose M.D.   On: 12/28/2022 20:13   CARDIAC CATHETERIZATION  Result Date: 12/28/2022   Prox RCA lesion is 100% stenosed.  Bridging collaterals from proximal RCA to mid RCA.   Mid Cx  lesion is 100% stenosed.  Left to left collaterals.  Prior to the mid vessel occlusion, three is a large OM branch.  Unable to cross with  workhorse wire.   1st Diag lesion is 25% stenosed.   2nd Diag lesion is 70% stenosed.   Prox Cx lesion is 50% stenosed.   Mid LAD lesion is 99% stenosed.  A drug-eluting stent was successfully placed using a STENT SYNERGY XD 3.0X32, postdilated to 3.5 mm.   Post intervention, there is a 0% residual stenosis.   The left ventricular systolic function is normal.   LV end diastolic pressure is normal.   The left ventricular ejection fraction is 55-65% by visual estimate.   There is no aortic valve stenosis. Acute anterior MI due to thrombotic mid LAD lesion.  Successfully treated with drug-eluting stent.  Dual antiplatelet therapy for at least 12 months.  Increase intensity of statin.  I stressed the importance of compliance with his medication.  Other residual disease will require aggressive medical therapy.  Depending on symptoms, will consider repeat catheterization to address the other disease. Results conveyed to [name] [phone]     LOS: 1 day   Lanae Boast, MD [facility]  12/29/2022, 11:17 AM

## 2022-12-30 DIAGNOSIS — K922 Gastrointestinal hemorrhage, unspecified: Secondary | ICD-10-CM | POA: Diagnosis not present

## 2022-12-30 DIAGNOSIS — I2510 Atherosclerotic heart disease of native coronary artery without angina pectoris: Secondary | ICD-10-CM | POA: Diagnosis not present

## 2022-12-30 DIAGNOSIS — K920 Hematemesis: Secondary | ICD-10-CM | POA: Diagnosis not present

## 2022-12-30 DIAGNOSIS — I25110 Atherosclerotic heart disease of native coronary artery with unstable angina pectoris: Secondary | ICD-10-CM | POA: Diagnosis not present

## 2022-12-30 DIAGNOSIS — D62 Acute posthemorrhagic anemia: Secondary | ICD-10-CM

## 2022-12-30 DIAGNOSIS — I10 Essential (primary) hypertension: Secondary | ICD-10-CM | POA: Diagnosis not present

## 2022-12-30 DIAGNOSIS — I2109 ST elevation (STEMI) myocardial infarction involving other coronary artery of anterior wall: Secondary | ICD-10-CM | POA: Diagnosis not present

## 2022-12-30 DIAGNOSIS — Z955 Presence of coronary angioplasty implant and graft: Secondary | ICD-10-CM | POA: Diagnosis not present

## 2022-12-30 DIAGNOSIS — K2970 Gastritis, unspecified, without bleeding: Secondary | ICD-10-CM

## 2022-12-30 LAB — CBC
HCT: 32.3 % — ABNORMAL LOW (ref 39.0–52.0)
Hemoglobin: 11.3 g/dL — ABNORMAL LOW (ref 13.0–17.0)
MCH: 31 pg (ref 26.0–34.0)
MCHC: 35 g/dL (ref 30.0–36.0)
MCV: 88.5 fL (ref 80.0–100.0)
Platelets: 180 10*3/uL (ref 150–400)
RBC: 3.65 MIL/uL — ABNORMAL LOW (ref 4.22–5.81)
RDW: 12.1 % (ref 11.5–15.5)
WBC: 13.7 10*3/uL — ABNORMAL HIGH (ref 4.0–10.5)
nRBC: 0 % (ref 0.0–0.2)

## 2022-12-30 LAB — COMPREHENSIVE METABOLIC PANEL
ALT: 338 U/L — ABNORMAL HIGH (ref 0–44)
AST: 462 U/L — ABNORMAL HIGH (ref 15–41)
Albumin: 3 g/dL — ABNORMAL LOW (ref 3.5–5.0)
Alkaline Phosphatase: 53 U/L (ref 38–126)
Anion gap: 7 (ref 5–15)
BUN: 13 mg/dL (ref 6–20)
CO2: 20 mmol/L — ABNORMAL LOW (ref 22–32)
Calcium: 7.6 mg/dL — ABNORMAL LOW (ref 8.9–10.3)
Chloride: 109 mmol/L (ref 98–111)
Creatinine, Ser: 0.87 mg/dL (ref 0.61–1.24)
GFR, Estimated: >60 mL/min (ref >60)
Glucose, Bld: 96 mg/dL (ref 70–99)
Potassium: 4.2 mmol/L (ref 3.5–5.1)
Sodium: 136 mmol/L (ref 135–145)
Total Bilirubin: 0.9 mg/dL (ref 0.3–1.2)
Total Protein: 4.9 g/dL — ABNORMAL LOW (ref 6.5–8.1)

## 2022-12-30 LAB — HEMOGLOBIN AND HEMATOCRIT, BLOOD
HCT: 34.9 % — ABNORMAL LOW (ref 39.0–52.0)
HCT: 33.2 % — ABNORMAL LOW (ref 39.0–52.0)
Hemoglobin: 11.9 g/dL — ABNORMAL LOW (ref 13.0–17.0)
Hemoglobin: 11.4 g/dL — ABNORMAL LOW (ref 13.0–17.0)

## 2022-12-30 LAB — SURGICAL PATHOLOGY

## 2022-12-30 NOTE — Progress Notes (Signed)
CARDIAC REHAB PHASE I    Pt resting in bed, feeling well today. Only complaint is feeling tired. Pt declined ambulation in hall, reporting he feels to tired. Informed pt that he will, at some point need to ambulate to assess for tolerance post MI/stent. Post MI/ stent education including risk factors, restrictions, antiplatelet therapy importance, exercise guidelines, heart healthy diabetic diet, MI booklet, NTG use, site care, substance abuse cessation and CRP2 reviewed. All questions and concerns addressed. Will refer AP for CRP2. Spoke with ICU RN after visit, states she will assist with ambulation later today. Will continue to follow.    [id number]  Woodroe Chen, RN BSN 12/30/2022 9:54 AM

## 2022-12-30 NOTE — Progress Notes (Signed)
PROGRESS NOTE [name] [name]  ZOX:[id number] DOB: March 22, 1966 DOA: 12/28/2022 PCP: Benetta Spar, MD  Brief Narrative/Hospital Course: 57 yyom w/ chronic  b/l shoulder pain/chronic pain syndrome on methadone and oxycodone, bipolar 1 disorder not on any psych medications, polysubstance abuse including cocaine/THC, hyperlipidemia, medication noncompliance, who presented to [facility] ED as a code STEMI on 12/28/22 by [facility], picked up near his house as he was knocking on his neighbors door,reportedly he passed out, ran away, and fell into a ditch. He thought somebody was breaking into his house. His EKG was concerning for ST elevation MI.  Code STEMI was called and the patient was brought into the ED at [facility].  He received a full dose aspirin en route and 1.5 L NS IV fluid bolus via EMS. 4/28> S/p LHC- findings were concerning for acute anterior MI due to thrombotic mid LAD lesion treated with drug-eluting stent and was started on DAPT on 12/28/2022, to continue for at least 12 months. Due to history of bipolar disorder and chronic pain syndrome, TRH, hospitalist service, was asked to see in consultation and to assist with the management of his a forementioned chronic medical conditions. 4/28>>he had an episode of ground coffee emesis about a large cup full- started BID IV Protonix, GI was consulted 4/29: S/p EGD:a small Mallory-Weiss tear, significant gastritis and ulceration as if some ischemic changes. S/p Biops to rule out H. Pylori,proximal duodenum had some changes. Advised ppi IV  x 48 hrs carafate. Hold aspirin if possible 2 to 4 weeks    Subjective: Patient seen and examined this morning. He is resting comfortably No BM in last 3 days eating some food but not much denies nausea vomiting abdominal pain, chest pain or shortness of breath Overnight afebrile BP stable Labs with hemoglobin slightly low at 11.9  Assessment and Plan: Principal Problem:   Acute ST elevation  myocardial infarction (STEMI) of anterior wall (HCC) Active Problems:   Coronary artery disease involving native coronary artery of native heart with unstable angina pectoris (HCC)   Presence of drug coated stent in LAD coronary artery   Acute upper GI bleeding   Hyperlipidemia with target LDL less than 70   BIPOLAR AFFECTIVE DISORDER   Acute renal failure (HCC)   Opiate dependence, continuous (HCC)   Substance induced mood disorder (HCC)   Essential hypertension   ST elevation myocardial infarction (STEMI) (HCC)   Hematemesis with nausea   Antiplatelet or antithrombotic long-term use  Acute STEMI of anterior wall:due to thrombotic mid LAD lesion: s/p LHC w/ DES- Needs DAPT x 12 month-but due to GI bleeding now on Brilinta alone GI advised to hold off aspirin for 6 to 7 weeks.  Continue high intensity statin,Lopressor 12.5 mg twice daily. Cont education on compliance and aggressive risk factor modification.cardio following   Upper GI bleeding Coffee-ground emesis 5/28 Upper GI bleed with history of esophagitis seen on upper endoscopy done in 2018: EGD 5/29> small Mallory-Weiss tear, significant gastritis and ulceration as if some ischemic changes. S/p Biopsy to rule out H. Pylori,proximal duodenum had some changes. GI advised ppi IV  x 48 hrs until 5/31, carafate 1 g 4 times x 2 months then twice daily for 2 months, consider repeat endoscopy in 4 to 6 months.Holding aspirin if possible 2 to 4 weeks  Holding aspirin for now, continue PPI twice daily GI following for EGD today.  Hemoglobin remains stable  Acute blood loss anemia with mild drop in hemoglobin due to upper GI  bleeding.  Continue PPI as above monitor H&H Recent Labs  Lab 12/29/22 0154 12/29/22 1043 12/29/22 1508 12/30/22 0123 12/30/22 0842  HGB 14.5 13.5 13.0 11.9* 11.3*  HCT 41.8 39.9 38.9* 34.9* 32.3*    Bipolar 1 disorder: Off psych medication over a year, has not followed up with psych, declined psych consultation  here, will need outpatient follow-up.  Currently he is AAOx3 denies suicidal ideation homicidal ideation or hallucination, agreeable with medical treatment for his MI  Chronic shoulder pain Chronic pain syndrome: Cont home methadone and oxycodone, symptomatic management    Acute metabolic encephalopathy w/ delirium: Complicated by polysubstance abuse including THC and cocaine.Currently resolved alert and oriented x 3, follows commands appropriately    Polysubstance abuse including THC and cocaine: cessation counseling done 5/28- denied recent use of polysubstance.Denies use of alcohol but UDS positive for Benzo, THC, and cocaine. TOC consulted  AKI-resolved High anion gap metabolic acidosis -resolved   Leukocytosis, possibly reactive: CXR-nonacute, UA is pending-but no fever holding off on antibiotic leukocytosis downtrending   DVT prophylaxis: SCD Code Status:   Code Status: Full Code Family Communication: plan of care discussed with patien  at bedside. Patient status is: Inpatient because of GI bleed, STEMI Level of care: ICU > hopefully transfer out of ICU today  Dispo: The patient is from: home            Anticipated disposition: home 1-2 days Objective: Vitals last 24 hrs: Vitals:   12/30/22 0700 12/30/22 0800 12/30/22 0803 12/30/22 0900  BP: 94/84     Pulse: 81 89  78  Resp: 13 (!) 29  14  Temp:   97.6 F (36.4 C)   TempSrc:   Oral   SpO2: 97% 96%  96%  Weight:      Height:       Weight change: 3.317 kg  Physical Examination: General exam: AAox3, weak,57 appearing HEENT:Oral mucosa moist, Ear/Nose WNL grossly, dentition normal. Respiratory system: bilaterally clear BS, no use of accessory muscle Cardiovascular system: S1 & S2 +, regular rate. Gastrointestinal system: Abdomen soft, NT,ND,BS+ Nervous System:Alert, awake, moving extremities and grossly nonfocal Extremities: LE ankle edema neg, lower extremities warm Skin: No rashes,no icterus. MSK: Normal muscle  bulk,tone, power   Medications reviewed: Scheduled Meds:  atorvastatin  80 mg Oral Daily   Chlorhexidine Gluconate Cloth  6 each Topical Daily   docusate sodium  400 mg Oral Daily   methadone  10 mg Oral Q8H   metoprolol tartrate  12.5 mg Oral BID   pantoprazole (PROTONIX) IV  40 mg Intravenous BID   potassium chloride  40 mEq Oral BID   sodium chloride flush  3 mL Intravenous Q12H   sucralfate  1 g Oral TID WC & HS   ticagrelor  90 mg Oral BID  Continuous Infusions:  sodium chloride     sodium chloride      Diet Order             Diet Heart Room service appropriate? Yes; Fluid consistency: Thin  Diet effective 1000                  Intake/Output Summary (Last 24 hours) at 12/30/2022 1104 Last data filed at 12/30/2022 1100 Gross per 24 hour  Intake 2889.31 ml  Output 650 ml  Net 2239.31 ml  Net IO Since Admission: 2,099.95 mL [12/30/22 1104]  Wt Readings from Last 3 Encounters:  12/30/22 89.5 kg  08/24/22 86.2 kg  12/21/21 95.3 kg  Unresulted Labs (From admission, onward)     Start     Ordered   12/29/22 1300  Hemoglobin and hematocrit, blood  Now then every 12 hours,   R     Question:  Specimen collection method  Answer:  Lab=Lab collect   12/29/22 1109   12/29/22 0500  Lipoprotein A (LPA)  Tomorrow morning,   R        12/28/22 1952   12/29/22 0042  Urinalysis, Routine w reflex microscopic -Urine, Clean Catch  Once,   R       Question:  Specimen Source  Answer:  Urine, Clean Catch   12/29/22 0041   12/28/22 2136  Occult blood card to lab, stool  Once,   R        12/28/22 2135   12/28/22 1832  Rapid urine drug screen (hospital performed)  ONCE - STAT,   STAT        12/28/22 1831          Data Reviewed: I have personally reviewed following labs and imaging studies CBC: Recent Labs  Lab 12/28/22 1831 12/28/22 1900 12/28/22 2242 12/29/22 0154 12/29/22 1043 12/29/22 1508 12/30/22 0123 12/30/22 0842  WBC 15.4*  --  23.8* 17.1*  --   --   --  13.7*   NEUTROABS 13.5*  --   --   --   --   --   --   --   HGB 16.1   < > 16.1 14.5 13.5 13.0 11.9* 11.3*  HCT 47.1   < > 46.6 41.8 39.9 38.9* 34.9* 32.3*  MCV 88.5  --  86.1 86.7  --   --   --  88.5  PLT 277  --  267 264  --   --   --  180   < > = values in this interval not displayed.   Basic Metabolic Panel: Recent Labs  Lab 12/28/22 1831 12/28/22 1900 12/29/22 0154 12/30/22 0842  NA 138 141 137 136  K 4.2 4.0 3.5 4.2  CL 106 107 106 109  CO2 14*  --  18* 20*  GLUCOSE 151* 129* 160* 96  BUN 12 12 14 13   CREATININE 1.60* 1.40* 1.11 0.87  CALCIUM 8.7*  --  8.1* 7.6*  MG  --   --  2.5*  --   PHOS  --   --  3.7  --    GFR: Estimated Creatinine Clearance: 101.2 mL/min (by C-G formula based on SCr of 0.87 mg/dL). Liver Function Tests: Recent Labs  Lab 12/28/22 1831 12/30/22 0842  AST 39 462*  ALT 27 338*  ALKPHOS 57 53  BILITOT 0.9 0.9  PROT 6.5 4.9*  ALBUMIN 3.9 3.0*   Recent Labs  Lab 12/28/22 1831  INR 1.2   Recent Labs    12/28/22 1831  HGBA1C 5.3   Recent Labs    12/28/22 1831 12/29/22 0154  CHOL 251* 221*  HDL 37* 33*  LDLCALC 186* 914*  TRIG 139 117  CHOLHDL 6.8 6.7   Recent Labs  Lab 12/29/22 0155  LATICACIDVEN 1.6   Recent Results (from the past 240 hour(s))  MRSA Next Gen by PCR, Nasal     Status: None   Collection Time: 12/28/22  7:52 PM   Specimen: Nasal Swab  Result Value Ref Range Status   MRSA by PCR Next Gen NOT DETECTED NOT DETECTED Final    Comment: (NOTE) The GeneXpert MRSA Assay (FDA approved for NASAL specimens only), is  one component of a comprehensive MRSA colonization surveillance program. It is not intended to diagnose MRSA infection nor to guide or monitor treatment for MRSA infections. Test performance is not FDA approved in patients less than 68 years old. Performed at [facility], [address], [place], [place] [address]   Antimicrobials: Anti-infectives (From admission, onward)    None       Culture/Microbiology    Component Value Date/Time   SDES URINE, CLEAN CATCH 02/13/2012 1620   SPECREQUEST NONE 02/13/2012 1620   CULT NO GROWTH 02/13/2012 1620   REPTSTATUS 02/15/2012 FINAL 02/13/2012 1620   Radiology Studies: ECHOCARDIOGRAM COMPLETE  Result Date: 12/29/2022    ECHOCARDIOGRAM REPORT   Patient Name:   FLOY CASTREJON Date of Exam: 12/29/2022 Medical Rec #:  [id number]       Height:       67.0 in Accession #:    [id number]      Weight:       190.0 lb Date of Birth:  1965-11-18       BSA:          1.979 m Patient Age:    56 years        BP:           111/78 mmHg Patient Gender: M               HR:           70 bpm. Exam Location:  Inpatient Procedure: 2D Echo, Color Doppler and Cardiac Doppler Indications:    Acute MI i21.9  History:        Patient has no prior history of Echocardiogram examinations.                 CAD, COPD; Risk Factors:Hypertension, Dyslipidemia and Sleep                 Apnea.  Sonographer:    Irving Burton Senior RDCS Referring Phys: [phone] DAYNA N DUNN IMPRESSIONS  1. Study consistent with LAD territory infarct. Left ventricular ejection fraction, by estimation, is 50 to 55%. The left ventricle has low normal function. The left ventricle demonstrates regional wall motion abnormalities (see scoring diagram/findings  for description). Left ventricular diastolic parameters are consistent with Grade I diastolic dysfunction (impaired relaxation).  2. Right ventricular systolic function is normal. The right ventricular size is normal. Tricuspid regurgitation signal is inadequate for assessing PA pressure.  3. The mitral valve is normal in structure. Trivial mitral valve regurgitation.  4. The aortic valve was not well visualized. Aortic valve regurgitation is not visualized.  5. The inferior vena cava is normal in size with <50% respiratory variability, suggesting right atrial pressure of 8 mmHg. Comparison(s): No prior Echocardiogram. FINDINGS  Left Ventricle: Study consistent  with LAD territory infarct. Left ventricular ejection fraction, by estimation, is 50 to 55%. The left ventricle has low normal function. The left ventricle demonstrates regional wall motion abnormalities. The left ventricular internal cavity size was normal in size. There is no left ventricular hypertrophy. Left ventricular diastolic parameters are consistent with Grade I diastolic dysfunction (impaired relaxation).  LV Wall Scoring: The mid and distal anterior septum, apical anterior segment, and apex are hypokinetic. The anterior wall, entire lateral wall, entire inferior wall, basal anteroseptal segment, mid inferoseptal segment, and basal inferoseptal segment are normal. Right Ventricle: The right ventricular size is normal. No increase in right ventricular wall thickness. Right ventricular systolic function is normal. Tricuspid regurgitation signal is inadequate for  assessing PA pressure. Left Atrium: Left atrial size was normal in size. Right Atrium: Right atrial size was normal in size. Pericardium: There is no evidence of pericardial effusion. Mitral Valve: The mitral valve is normal in structure. Trivial mitral valve regurgitation. Tricuspid Valve: The tricuspid valve is normal in structure. Tricuspid valve regurgitation is not demonstrated. Aortic Valve: The aortic valve was not well visualized. Aortic valve regurgitation is not visualized. Pulmonic Valve: The pulmonic valve was normal in structure. Pulmonic valve regurgitation is not visualized. Aorta: The aortic root and ascending aorta are structurally normal, with no evidence of dilitation. Venous: The inferior vena cava is normal in size with less than 50% respiratory variability, suggesting right atrial pressure of 8 mmHg. IAS/Shunts: No atrial level shunt detected by color flow Doppler.  LEFT VENTRICLE PLAX 2D LVIDd:         4.30 cm     Diastology LVIDs:         3.00 cm     LV e' medial:    5.11 cm/s LV PW:         1.00 cm     LV E/e' medial:  10.2  LV IVS:        0.90 cm     LV e' lateral:   9.68 cm/s LVOT diam:     2.10 cm     LV E/e' lateral: 5.4 LV SV:         70 LV SV Index:   35 LVOT Area:     3.46 cm  LV Volumes (MOD) LV vol d, MOD A2C: 78.0 ml LV vol d, MOD A4C: 77.9 ml LV vol s, MOD A2C: 35.0 ml LV vol s, MOD A4C: 41.7 ml LV SV MOD A2C:     43.0 ml LV SV MOD A4C:     77.9 ml LV SV MOD BP:      38.8 ml RIGHT VENTRICLE             IVC RV S prime:     11.70 cm/s  IVC diam: 1.80 cm TAPSE (M-mode): 2.1 cm LEFT ATRIUM             Index        RIGHT ATRIUM           Index LA diam:        3.40 cm 1.72 cm/m   RA Area:     12.00 cm LA Vol (A2C):   42.2 ml 21.33 ml/m  RA Volume:   26.80 ml  13.54 ml/m LA Vol (A4C):   24.2 ml 12.26 ml/m LA Biplane Vol: 39.6 ml 20.01 ml/m  AORTIC VALVE LVOT Vmax:   120.00 cm/s LVOT Vmean:  73.100 cm/s LVOT VTI:    0.202 m  AORTA Ao Root diam: 3.10 cm Ao Asc diam:  2.60 cm MITRAL VALVE MV Area (PHT): 4.89 cm    SHUNTS MV Decel Time: 155 msec    Systemic VTI:  0.20 m MV E velocity: 51.90 cm/s  Systemic Diam: 2.10 cm MV A velocity: 88.60 cm/s MV E/A ratio:  0.59 Riley Lam MD Electronically signed by Riley Lam MD Signature Date/Time: 12/29/2022/12:17:22 PM    Final    DG Chest Port 1 View  Result Date: 12/28/2022 CLINICAL DATA:  Chest pain EXAM: PORTABLE CHEST 1 VIEW COMPARISON:  06/25/2021 FINDINGS: The heart size and mediastinal contours are within normal limits. Both lungs are clear. The visualized skeletal structures are unremarkable. IMPRESSION: Normal study. Electronically Signed  By: Charlett Nose M.D.   On: 12/28/2022 20:13   CARDIAC CATHETERIZATION  Result Date: 12/28/2022   Prox RCA lesion is 100% stenosed.  Bridging collaterals from proximal RCA to mid RCA.   Mid Cx lesion is 100% stenosed.  Left to left collaterals.  Prior to the mid vessel occlusion, three is a large OM branch.  Unable to cross with workhorse wire.   1st Diag lesion is 25% stenosed.   2nd Diag lesion is 70% stenosed.    Prox Cx lesion is 50% stenosed.   Mid LAD lesion is 99% stenosed.  A drug-eluting stent was successfully placed using a STENT SYNERGY XD 3.0X32, postdilated to 3.5 mm.   Post intervention, there is a 0% residual stenosis.   The left ventricular systolic function is normal.   LV end diastolic pressure is normal.   The left ventricular ejection fraction is 55-65% by visual estimate.   There is no aortic valve stenosis. Acute anterior MI due to thrombotic mid LAD lesion.  Successfully treated with drug-eluting stent.  Dual antiplatelet therapy for at least 12 months.  Increase intensity of statin.  I stressed the importance of compliance with his medication.  Other residual disease will require aggressive medical therapy.  Depending on symptoms, will consider repeat catheterization to address the other disease. Results conveyed to [name] [phone]     LOS: 2 days   Lanae Boast, MD [facility]  12/30/2022, 11:04 AM

## 2022-12-30 NOTE — Progress Notes (Signed)
Rounding Note    Patient Name: [name] [name] Date of Encounter: 12/30/2022  [facility] Cardiologist: None new  Patient Profile     57 y.o. male with schizophrenia, history of PSA (cocaine), chronic pain/opioid dependence, bipolar with substance abuse induced mood disorder, HLD, possible sleep apnea admitted 12/28/2022 with anterior STEMI. Apparently he tried to call 911 with the onset of symptoms feeling tired and fatigued on 12/20/2022.  The call would not go through so he went to his neighbor's house did not call a door.  Apparently the neighbor witnessed him pass out and then went away on the following to the ditch.  Symptoms were shoulder pain and possible syncope.  Concern for late presenting MI. Internal medicine consulted due to concerns for psychiatric issues and polysubstance abuse.  Subjective   Taken for EGD yesterday afternoon.  Mallory-Weiss tear with diffuse likely ischemic gastritis noted throughout the stomach.  IV PPI along with Carafate initiated by GI medicine.  Aspirin discontinued at least for 2 to 3 weeks.  Had 1 episode of emesis yesterday, but no hematemesis.  Overall feels better.  Shoulder still bothering but no chest pain.  Assessment & Plan    Principal Problem:   Acute ST elevation myocardial infarction (STEMI) of anterior wall (HCC) Active Problems:   Coronary artery disease involving native coronary artery of native heart with unstable angina pectoris (HCC)   Presence of drug coated stent in LAD coronary artery   Acute upper GI bleeding   Hyperlipidemia with target LDL less than 70   Acute renal failure (HCC)   Essential hypertension   BIPOLAR AFFECTIVE DISORDER   Substance induced mood disorder (HCC)   Opiate dependence, continuous (HCC)   ST elevation myocardial infarction (STEMI) (HCC)   Hematemesis with nausea   Antiplatelet or antithrombotic long-term use  Principal Problem:   Acute ST elevation myocardial infarction (STEMI)  of anterior wall (HCC) / Coronary artery disease involving native coronary artery of native heart with unstable angina pectoris (HCC) =>   Presence of drug coated stent in LAD coronary artery Found to have severe proximal to mid LAD disease treated with DES stent.  Also has significant RCA and LCx disease as noted.  The upstream LCx lesion is not all that significant and unless there is significant ongoing pain would probably avoid further evaluation for now.  Can assess symptoms as we go.  Echo with low normal EF and not expectedly mild LAD motion abnormality. Initial plan was 1 yr DAPT -- will hold ASA 2/2 GI Bleed x 2-3 weeks & continue Brilinta. . High-dose high intensity statin Started Beta Blocker 12.5 mg Lopressor(metoprolol tartrate)  Consider ARB if BP will tolerate    Hyperlipidemia with target LDL less than 70 Statin converted from Simvastatin 40 mg  to Atorvastatin 80 mg daily => He is having significant shoulder stiffness and pain in the day.  I do not think this is related to just recently switching him to atorvastatin, but will need to monitor.  Would ask that the [facility] team assist with addressing this discomfort.    Essential hypertension Not on any meds PTA - started on low dose Metoprolol 12.5 mg BID      Acute renal failure (HCC) creatinine on arrival was 1.6 now down to 1.1.  Resolved.   Monitor.  Likely related to dehydration.     BIPOLAR AFFECTIVE DISORDER / Substance induced mood disorder (HCC) /   Opiate dependence, continuous (HCC) Per TRH Patient denies drug  any recent use On Methadone for maintenance -- continue home dose.     Acute GI Bleed -- 2 episodes of coffee Ground emesis over night - after PCI => started on IV PPI & GI consult seeing now.   Hemoglobin down to 11.9 today.  Will continue to monitor. Will continue to Hold ASA (at least 2 weeks) & continue Brlinta monotherapy EGD Done yesterday:   Per GI, will Continue IV PPI twice daily for 48 more hours.  => Then transition to p.o. PPI twice daily thereafter.  Carafate 1 g 4 times daily for 2 months and then twice daily for 2 months.   Hypokalemia -- K down to 3.5.  Labs still pending for today.  Was given K-Dur x 2  Leukocytosis --remains afebrile.  CBC pending.  Bilateral shoulder pain,  will defer to [facility] for further management.  Appreciate TRH & GI Medicine Assistance --   Recommendations: Transfer to Tele today - continue IV PPI  until 48 hr complete.    Inpatient Medications    Scheduled Meds:  atorvastatin  80 mg Oral Daily   Chlorhexidine Gluconate Cloth  6 each Topical Daily   docusate sodium  400 mg Oral Daily   methadone  10 mg Oral Q8H   metoprolol tartrate  12.5 mg Oral BID   pantoprazole (PROTONIX) IV  40 mg Intravenous BID   potassium chloride  40 mEq Oral BID   sodium chloride flush  3 mL Intravenous Q12H   sucralfate  1 g Oral TID WC & HS   ticagrelor  90 mg Oral BID   Continuous Infusions:  sodium chloride     sodium chloride     sodium chloride 100 mL/hr at 12/30/22 0600   PRN Meds: sodium chloride, acetaminophen, ondansetron (ZOFRAN) IV, oxyCODONE-acetaminophen **AND** oxyCODONE, sodium chloride flush   Vital Signs    Vitals:   12/30/22 0300 12/30/22 0400 12/30/22 0600 12/30/22 0803  BP: 99/68 139/68 (!) 147/81   Pulse: 79 82 88   Resp: 18     Temp:    97.6 F (36.4 C)  TempSrc:    Oral  SpO2: 94% 96% 96%   Weight:   89.5 kg   Height:        Intake/Output Summary (Last 24 hours) at 12/30/2022 0812 Last data filed at 12/30/2022 0600 Gross per 24 hour  Intake 2666.4 ml  Output 650 ml  Net 2016.4 ml      12/30/2022    6:00 AM 12/28/2022    6:48 PM 08/24/2022    5:35 AM  Last 3 Weights  Weight (lbs) 197 lb 5 oz 190 lb 190 lb  Weight (kg) 89.5 kg 86.183 kg 86.183 kg      Telemetry    Sinus rhythm with PVCs-rates in the 70s-80s.- Personally Reviewed  ECG    No new studies  Cardiac Studies   Cardiac Cath 12/28/2022: Acute anterior  STEMI-thrombotic mid LAD 99% (DES PCI Synergy XD 3.0 32 postdilated to 3.5 mm -> reduced to 0%.  TIMI-2 flow pre and TIMI-3 flow post)..  Proximal RCA 100% occluded with brisk bridging collaterals from proximal to mid RCA filling the entire RCA system, proximal LCx 50% and then mid LCx 100% CTO after OM 1 -> at least 2 additional OM branches filling via right to left and left to left collaterals.  D1 25%, D2 70%.  EF 55 to 65%.  Normal wall motion.  Normal EDP. Recommendations: DAPT for minimum 12 months.;  Increased  intensity statin.  Consider reevaluation of other disease, depending on symptoms. Diagnostic        Intervention Dominance: Right    Echo 12/29/2022: EF 50-55% / Low Normal with Anterior wall HK c/w LAD infarct. Normal RV & valves.  Mild elevated RAP.   Physical Exam   GEN: NAD resting comfortably in bed.  Somewhat fidgety with his legs. Neck: No JVD or bruit Cardiac: RRR, no M/R/G normal S1-S2 Respiratory: CTAB nonlabored, good air movement. GI: Soft/NT/ND STEMI BS.  No HSM. MS: No C/C/C.  Shoulder joint stiffness. Neuro:  Nonfocal  Psych: Unusual affect.  Easily startled  Labs    High Sensitivity Troponin:   Recent Labs  Lab 12/28/22 1831 12/28/22 2242  TROPONINIHS 916* 1,521*     Chemistry Recent Labs  Lab 12/28/22 1831 12/28/22 1900 12/29/22 0154  NA 138 141 137  K 4.2 4.0 3.5  CL 106 107 106  CO2 14*  --  18*  GLUCOSE 151* 129* 160*  BUN 12 12 14   CREATININE 1.60* 1.40* 1.11  CALCIUM 8.7*  --  8.1*  MG  --   --  2.5*  PROT 6.5  --   --   ALBUMIN 3.9  --   --   AST 39  --   --   ALT 27  --   --   ALKPHOS 57  --   --   BILITOT 0.9  --   --   GFRNONAA 50*  --  >60  ANIONGAP 18*  --  13    Lipids  Recent Labs  Lab 12/29/22 0154  CHOL 221*  TRIG 117  HDL 33*  LDLCALC 165*  CHOLHDL 6.7    Hematology Recent Labs  Lab 12/28/22 1831 12/28/22 1900 12/28/22 2242 12/29/22 0154 12/29/22 1043 12/29/22 1508 12/30/22 0123  WBC 15.4*  --  23.8*  17.1*  --   --   --   RBC 5.32  --  5.41 4.82  --   --   --   HGB 16.1   < > 16.1 14.5 13.5 13.0 11.9*  HCT 47.1   < > 46.6 41.8 39.9 38.9* 34.9*  MCV 88.5  --  86.1 86.7  --   --   --   MCH 30.3  --  29.8 30.1  --   --   --   MCHC 34.2  --  34.5 34.7  --   --   --   RDW 12.0  --  12.3 12.3  --   --   --   PLT 277  --  267 264  --   --   --    < > = values in this interval not displayed.   Thyroid No results for input(s): "TSH", "FREET4" in the last 168 hours.  BNPNo results for input(s): "BNP", "PROBNP" in the last 168 hours.  DDimer No results for input(s): "DDIMER" in the last 168 hours.   Radiology    DG Chest Port 1 View  Result Date: 12/28/2022 CLINICAL DATA:  Chest pain EXAM: PORTABLE CHEST 1 VIEW COMPARISON:  06/25/2021 FINDINGS: The heart size and mediastinal contours are within normal limits. Both lungs are clear. The visualized skeletal structures are unremarkable. IMPRESSION: Normal study. Electronically Signed   By: Charlett Nose M.D.   On: 12/28/2022 20:13      For questions or updates, please contact [facility] Please consult [email or web address] for contact info under        Signed,  Bryan Lemma, MD  12/30/2022, 8:12 AM

## 2022-12-30 NOTE — Progress Notes (Signed)
Progress Note  Primary GI:  Dr. Jena Gauss    Subjective  Chief Complaint: Hematemesis in setting of Brilinta/ASA with STEMI   No family was present at the time of my evaluation. Patient lying in bed comfortably, male complaining of bilateral shoulder pain still. Has had nausea, 1 further episode of vomiting but no hematemesis or coffee-ground emesis. Patient's not had any bowel movements. No abdominal pain    Objective   Vital signs in last 24 hours: Temp:  [97.3 F (36.3 C)-98 F (36.7 C)] 97.6 F (36.4 C) (05/30 0803) Pulse Rate:  [61-92] 78 (05/30 0900) Resp:  [9-29] 14 (05/30 0900) BP: (93-147)/(57-103) 94/84 (05/30 0700) SpO2:  [94 %-100 %] 96 % (05/30 0900) Weight:  [89.5 kg] 89.5 kg (05/30 0600) Last BM Date :  (PTA) Last BM recorded by nurses in past 5 days No data recorded  General:   male in no acute distress  Heart:  Regular rate and rhythm; no murmurs Pulm: Clear anteriorly; no wheezing Abdomen:  Soft, Non-distended AB, Active bowel sounds. No tenderness  Extremities:  without  edema. Neurologic:  Alert and  oriented x4;  No focal deficits.  Psych:  Cooperative. Normal mood and affect.  Intake/Output from previous day: 05/29 0701 - 05/30 0700 In: 2716.4 [P.O.:480; I.V.:2236.4] Out: 650 [Urine:650] Intake/Output this shift: Total I/O In: 309.6 [I.V.:309.6] Out: -   Studies/Results: ECHOCARDIOGRAM COMPLETE  Result Date: 12/29/2022    ECHOCARDIOGRAM REPORT   Patient Name:   [name] [name] Date of Exam: 12/29/2022 Medical Rec #:  [id number]       Height:       67.0 in Accession #:    [id number]      Weight:       190.0 lb Date of Birth:  October 07, 1965       BSA:          1.979 m Patient Age:    56 years        BP:           111/78 mmHg Patient Gender: M               HR:           70 bpm. Exam Location:  Inpatient Procedure: 2D Echo, Color Doppler and Cardiac Doppler Indications:    Acute MI i21.9  History:        Patient has no prior history of Echocardiogram  examinations.                 CAD, COPD; Risk Factors:Hypertension, Dyslipidemia and Sleep                 Apnea.  Sonographer:    Irving Burton Senior RDCS Referring Phys: [phone] DAYNA N DUNN IMPRESSIONS  1. Study consistent with LAD territory infarct. Left ventricular ejection fraction, by estimation, is 50 to 55%. The left ventricle has low normal function. The left ventricle demonstrates regional wall motion abnormalities (see scoring diagram/findings  for description). Left ventricular diastolic parameters are consistent with Grade I diastolic dysfunction (impaired relaxation).  2. Right ventricular systolic function is normal. The right ventricular size is normal. Tricuspid regurgitation signal is inadequate for assessing PA pressure.  3. The mitral valve is normal in structure. Trivial mitral valve regurgitation.  4. The aortic valve was not well visualized. Aortic valve regurgitation is not visualized.  5. The inferior vena cava is normal in size with <50% respiratory variability, suggesting right atrial pressure of 8 mmHg. Comparison(s): No  prior Echocardiogram. FINDINGS  Left Ventricle: Study consistent with LAD territory infarct. Left ventricular ejection fraction, by estimation, is 50 to 55%. The left ventricle has low normal function. The left ventricle demonstrates regional wall motion abnormalities. The left ventricular internal cavity size was normal in size. There is no left ventricular hypertrophy. Left ventricular diastolic parameters are consistent with Grade I diastolic dysfunction (impaired relaxation).  LV Wall Scoring: The mid and distal anterior septum, apical anterior segment, and apex are hypokinetic. The anterior wall, entire lateral wall, entire inferior wall, basal anteroseptal segment, mid inferoseptal segment, and basal inferoseptal segment are normal. Right Ventricle: The right ventricular size is normal. No increase in right ventricular wall thickness. Right ventricular systolic function is  normal. Tricuspid regurgitation signal is inadequate for assessing PA pressure. Left Atrium: Left atrial size was normal in size. Right Atrium: Right atrial size was normal in size. Pericardium: There is no evidence of pericardial effusion. Mitral Valve: The mitral valve is normal in structure. Trivial mitral valve regurgitation. Tricuspid Valve: The tricuspid valve is normal in structure. Tricuspid valve regurgitation is not demonstrated. Aortic Valve: The aortic valve was not well visualized. Aortic valve regurgitation is not visualized. Pulmonic Valve: The pulmonic valve was normal in structure. Pulmonic valve regurgitation is not visualized. Aorta: The aortic root and ascending aorta are structurally normal, with no evidence of dilitation. Venous: The inferior vena cava is normal in size with less than 50% respiratory variability, suggesting right atrial pressure of 8 mmHg. IAS/Shunts: No atrial level shunt detected by color flow Doppler.  LEFT VENTRICLE PLAX 2D LVIDd:         4.30 cm     Diastology LVIDs:         3.00 cm     LV e' medial:    5.11 cm/s LV PW:         1.00 cm     LV E/e' medial:  10.2 LV IVS:        0.90 cm     LV e' lateral:   9.68 cm/s LVOT diam:     2.10 cm     LV E/e' lateral: 5.4 LV SV:         70 LV SV Index:   35 LVOT Area:     3.46 cm  LV Volumes (MOD) LV vol d, MOD A2C: 78.0 ml LV vol d, MOD A4C: 77.9 ml LV vol s, MOD A2C: 35.0 ml LV vol s, MOD A4C: 41.7 ml LV SV MOD A2C:     43.0 ml LV SV MOD A4C:     77.9 ml LV SV MOD BP:      38.8 ml RIGHT VENTRICLE             IVC RV S prime:     11.70 cm/s  IVC diam: 1.80 cm TAPSE (M-mode): 2.1 cm LEFT ATRIUM             Index        RIGHT ATRIUM           Index LA diam:        3.40 cm 1.72 cm/m   RA Area:     12.00 cm LA Vol (A2C):   42.2 ml 21.33 ml/m  RA Volume:   26.80 ml  13.54 ml/m LA Vol (A4C):   24.2 ml 12.26 ml/m LA Biplane Vol: 39.6 ml 20.01 ml/m  AORTIC VALVE LVOT Vmax:   120.00 cm/s LVOT Vmean:  73.100 cm/s LVOT VTI:  0.202 m   AORTA Ao Root diam: 3.10 cm Ao Asc diam:  2.60 cm MITRAL VALVE MV Area (PHT): 4.89 cm    SHUNTS MV Decel Time: 155 msec    Systemic VTI:  0.20 m MV E velocity: 51.90 cm/s  Systemic Diam: 2.10 cm MV A velocity: 88.60 cm/s MV E/A ratio:  0.59 Riley Lam MD Electronically signed by Riley Lam MD Signature Date/Time: 12/29/2022/12:17:22 PM    Final    DG Chest Port 1 View  Result Date: 12/28/2022 CLINICAL DATA:  Chest pain EXAM: PORTABLE CHEST 1 VIEW COMPARISON:  06/25/2021 FINDINGS: The heart size and mediastinal contours are within normal limits. Both lungs are clear. The visualized skeletal structures are unremarkable. IMPRESSION: Normal study. Electronically Signed   By: Charlett Nose M.D.   On: 12/28/2022 20:13   CARDIAC CATHETERIZATION  Result Date: 12/28/2022   Prox RCA lesion is 100% stenosed.  Bridging collaterals from proximal RCA to mid RCA.   Mid Cx lesion is 100% stenosed.  Left to left collaterals.  Prior to the mid vessel occlusion, three is a large OM branch.  Unable to cross with workhorse wire.   1st Diag lesion is 25% stenosed.   2nd Diag lesion is 70% stenosed.   Prox Cx lesion is 50% stenosed.   Mid LAD lesion is 99% stenosed.  A drug-eluting stent was successfully placed using a STENT SYNERGY XD 3.0X32, postdilated to 3.5 mm.   Post intervention, there is a 0% residual stenosis.   The left ventricular systolic function is normal.   LV end diastolic pressure is normal.   The left ventricular ejection fraction is 55-65% by visual estimate.   There is no aortic valve stenosis. Acute anterior MI due to thrombotic mid LAD lesion.  Successfully treated with drug-eluting stent.  Dual antiplatelet therapy for at least 12 months.  Increase intensity of statin.  I stressed the importance of compliance with his medication.  Other residual disease will require aggressive medical therapy.  Depending on symptoms, will consider repeat catheterization to address the other disease.  Results conveyed to [name] [id number]    Lab Results: Recent Labs    12/28/22 2242 12/29/22 0154 12/29/22 1043 12/29/22 1508 12/30/22 0123 12/30/22 0842  WBC 23.8* 17.1*  --   --   --  13.7*  HGB 16.1 14.5   < > 13.0 11.9* 11.3*  HCT 46.6 41.8   < > 38.9* 34.9* 32.3*  PLT 267 264  --   --   --  180   < > = values in this interval not displayed.   BMET Recent Labs    12/28/22 1831 12/28/22 1900 12/29/22 0154 12/30/22 0842  NA 138 141 137 136  K 4.2 4.0 3.5 4.2  CL 106 107 106 109  CO2 14*  --  18* 20*  GLUCOSE 151* 129* 160* 96  BUN 12 12 14 13   CREATININE 1.60* 1.40* 1.11 0.87  CALCIUM 8.7*  --  8.1* 7.6*   LFT Recent Labs    12/30/22 0842  PROT 4.9*  ALBUMIN 3.0*  AST 462*  ALT 338*  ALKPHOS 53  BILITOT 0.9   PT/INR Recent Labs    12/28/22 1831  LABPROT 15.1  INR 1.2     Scheduled Meds:  atorvastatin  80 mg Oral Daily   Chlorhexidine Gluconate Cloth  6 each Topical Daily   docusate sodium  400 mg Oral Daily   methadone  10 mg Oral Q8H  metoprolol tartrate  12.5 mg Oral BID   pantoprazole (PROTONIX) IV  40 mg Intravenous BID   potassium chloride  40 mEq Oral BID   sodium chloride flush  3 mL Intravenous Q12H   sucralfate  1 g Oral TID WC & HS   ticagrelor  90 mg Oral BID   Continuous Infusions:  sodium chloride     sodium chloride     05/29 EGD   - No gross lesions in the majority of the esophagus.   - Small Mallory-Weiss tear at the GE junction.   - Z-line irregular, 39 cm from the incisors. - Congested, discolored, erythematous, friable (with contact bleeding), granular,    linearly eroded, texture changed and ulcerated   mucosa in the stomach. Biopsied - Mucosal changes in the duodenal bulb (biopsied).  -Pending pathology   Impression/Plan:    Coffee-ground emesis without hemodynamic compromise In the setting of Brilinta and ASA status post STEMI 5/28 with catheterization and DES stent to mid LAD HGB 14.5--> 11.9 3 g drop of  hemoglobin No further bleeding and he is stable. EGD with gastritis and ulceration in stomach, mucosal changes in duodenal bulb -Pending pathology Continue PPI twice daily IV for 24 more hours then can transition to p.o. PPI twice daily Carafate 1 g 4 times daily for 2 months then twice daily for 2 months Consider repeat endoscopic evaluation in 4 to 6 months when it would possibly be acceptable to withhold Brilinta. Can follow-up with Dr. Jena Gauss his primary gastroenterologist [facility] will sign off.  Please contact us if we can be of any further assistance during this hospital stay.   STEMI anterior wall status post DES stent mid LAD On Brilinta and aspirin, received doses today Will need at least 1 year of therapy   Polysubstance abuse with substance-induced mood disorder/bipolar disorder Consider outpatient versus inpatient counseling/treatment     Principal Problem:   Acute ST elevation myocardial infarction (STEMI) of anterior wall (HCC) Active Problems:   Hyperlipidemia with target LDL less than 70   BIPOLAR AFFECTIVE DISORDER   Acute renal failure (HCC)   Opiate dependence, continuous (HCC)   Substance induced mood disorder (HCC)   Essential hypertension   Coronary artery disease involving native coronary artery of native heart with unstable angina pectoris (HCC)   Presence of drug coated stent in LAD coronary artery   Acute upper GI bleeding   ST elevation myocardial infarction (STEMI) (HCC)   Hematemesis with nausea   Antiplatelet or antithrombotic long-term use    LOS: 2 days   Doree Albee  12/30/2022, 10:27 AM

## 2022-12-31 ENCOUNTER — Encounter: Payer: Self-pay | Admitting: Gastroenterology

## 2022-12-31 ENCOUNTER — Inpatient Hospital Stay (HOSPITAL_COMMUNITY): Payer: Medicare HMO

## 2022-12-31 DIAGNOSIS — I25110 Atherosclerotic heart disease of native coronary artery with unstable angina pectoris: Secondary | ICD-10-CM | POA: Diagnosis not present

## 2022-12-31 DIAGNOSIS — I213 ST elevation (STEMI) myocardial infarction of unspecified site: Secondary | ICD-10-CM

## 2022-12-31 DIAGNOSIS — Z7902 Long term (current) use of antithrombotics/antiplatelets: Secondary | ICD-10-CM

## 2022-12-31 DIAGNOSIS — K922 Gastrointestinal hemorrhage, unspecified: Secondary | ICD-10-CM | POA: Diagnosis not present

## 2022-12-31 DIAGNOSIS — I2109 ST elevation (STEMI) myocardial infarction involving other coronary artery of anterior wall: Secondary | ICD-10-CM | POA: Diagnosis not present

## 2022-12-31 DIAGNOSIS — D62 Acute posthemorrhagic anemia: Secondary | ICD-10-CM

## 2022-12-31 DIAGNOSIS — Z955 Presence of coronary angioplasty implant and graft: Secondary | ICD-10-CM | POA: Diagnosis not present

## 2022-12-31 LAB — COMPREHENSIVE METABOLIC PANEL
ALT: 517 U/L — ABNORMAL HIGH (ref 0–44)
AST: 486 U/L — ABNORMAL HIGH (ref 15–41)
Albumin: 2.8 g/dL — ABNORMAL LOW (ref 3.5–5.0)
Alkaline Phosphatase: 49 U/L (ref 38–126)
Anion gap: 6 (ref 5–15)
BUN: 17 mg/dL (ref 6–20)
CO2: 24 mmol/L (ref 22–32)
Calcium: 7.8 mg/dL — ABNORMAL LOW (ref 8.9–10.3)
Chloride: 104 mmol/L (ref 98–111)
Creatinine, Ser: 0.9 mg/dL (ref 0.61–1.24)
GFR, Estimated: >60 mL/min (ref >60)
Glucose, Bld: 98 mg/dL (ref 70–99)
Potassium: 4.3 mmol/L (ref 3.5–5.1)
Sodium: 134 mmol/L — ABNORMAL LOW (ref 135–145)
Total Bilirubin: 0.7 mg/dL (ref 0.3–1.2)
Total Protein: 4.9 g/dL — ABNORMAL LOW (ref 6.5–8.1)

## 2022-12-31 LAB — LIPOPROTEIN A (LPA): Lipoprotein (a): 300 nmol/L — ABNORMAL HIGH (ref <75.0)

## 2022-12-31 LAB — CBC
HCT: 30.8 % — ABNORMAL LOW (ref 39.0–52.0)
Hemoglobin: 10.7 g/dL — ABNORMAL LOW (ref 13.0–17.0)
MCH: 31.1 pg (ref 26.0–34.0)
MCHC: 34.7 g/dL (ref 30.0–36.0)
MCV: 89.5 fL (ref 80.0–100.0)
Platelets: 166 10*3/uL (ref 150–400)
RBC: 3.44 MIL/uL — ABNORMAL LOW (ref 4.22–5.81)
RDW: 12.1 % (ref 11.5–15.5)
WBC: 12.5 10*3/uL — ABNORMAL HIGH (ref 4.0–10.5)
nRBC: 0 % (ref 0.0–0.2)

## 2022-12-31 NOTE — Care Management Important Message (Signed)
Important Message  Patient Details  Name: [name] [name] MRN: [id number] Date of Birth: 07/19/1966   Medicare Important Message Given:  Yes     Renie Ora 12/31/2022, 11:07 AM

## 2022-12-31 NOTE — Progress Notes (Signed)
Mobility Specialist Progress Note:    12/31/22 1400  Mobility  Activity Ambulated with assistance in hallway  Level of Assistance Modified independent, requires aide device or extra time  Assistive Device None  Distance Ambulated (ft) 500 ft  Activity Response Tolerated well  Mobility Referral Yes  $Mobility charge 1 Mobility  Mobility Specialist Start Time (ACUTE ONLY) 1220  Mobility Specialist Stop Time (ACUTE ONLY) 1230  Mobility Specialist Time Calculation (min) (ACUTE ONLY) 10 min   Received pt in chair having no complaints and agreeable to mobility. Pt was asymptomatic throughout ambulation and returned to room w/o fault. Left in chair w/ call bell in reach and all needs met.    Thompson Grayer Mobility Specialist  Please contact vis Secure Chat or  Rehab Office [phone]

## 2022-12-31 NOTE — Consult Note (Signed)
   [facility] CM Inpatient Consult   12/31/2022  Tytus Willinger 1966/05/03 [id number]  [facility] [[facility]]  Accountable Care Organization [ACO] Patient:  [employer]  Primary Care Provider:  Benetta Spar, MD   Patient screened for hospitalization with noted medium risk score for unplanned readmission risk  and to assess for potential [facility]  [[facility]] Care Management service needs for post hospital transition for care coordination.  Review of patient's electronic medical record reveals patient is admitted with a STEMI.  Brief review of SDOH and post hospital follow up needs. Patient showing issues for transportation.    Met with patient about post hospital follow up and transportation needs.  Patient was given the 24 hour nurse advise line and a reminder card for post hospital follow up.  Patient states that his issues is getting back to [place] from [place].  States he has no issues with transportation at home in [place].  Also, told patient about potential transportation benefits with [facility] for medical appointments. He was pleasant and states he uses [facility] only when needed. Explained that [facility] RN can follow up for community post hospital needs when returning to home to check his progress.  He is agreeable and given the 24 hour nurse advise line magnet and an appointment reminder card. Patient states he does go to Dr. Letitia Neri office but, "always see someone else there, I hadn't seen Dr. Felecia Shelling in a long time."   Plan:  Continue to follow progress and disposition to assess for post hospital community care coordination/management needs.  Referral request for community care coordination: referral for new heart disease follow up  Of note, [facility] Care Management/Population Health does not replace or interfere with any arrangements made by the Inpatient Transition of Care team.  For questions contact:   Charlesetta Shanks, RN BSN CCM [facility]  [phone] business mobile phone Toll free office [phone]  *Concierge Line  [phone] Fax number: [phone] [email or web address]

## 2022-12-31 NOTE — Progress Notes (Signed)
CARDIAC REHAB PHASE I    Pt sitting in chair, feeling well this afternoon. Has just returned form walk in hall, with mobility team. Pt reports tolerating well with no CP,dizziness or SOB. Reviewed post stent/ MI education provided 12-30-22. All questions and concerns addressed. Referral sent to [facility] for CRP2.   [id number]  Woodroe Chen, RN BSN 12/31/2022 2:28 PM

## 2022-12-31 NOTE — Progress Notes (Signed)
   12/30/22 1935  Assess: MEWS Score  Temp 98.8 F (37.1 C)  BP (!) 77/59 (will recheck the BP)  MAP (mmHg) (!) 63  Pulse Rate 77  ECG Heart Rate 71  Assess: MEWS Score  MEWS Temp 0  MEWS Systolic 2  MEWS Pulse 0  MEWS RR 0  MEWS LOC 0  MEWS Score 2  MEWS Score Color Yellow  Assess: if the MEWS score is Yellow or Red  Were vital signs taken at a resting state? Yes  Focused Assessment No change from prior assessment  Does the patient meet 2 or more of the SIRS criteria? No  MEWS guidelines implemented  No, other (Comment) (will recheck his BP)  Assess: SIRS CRITERIA  SIRS Temperature  0  SIRS Pulse 0  SIRS Respirations  0  SIRS WBC 1  SIRS Score Sum  1

## 2022-12-31 NOTE — Progress Notes (Addendum)
Rounding Note    Patient Name: [name] [name] Date of Encounter: 12/31/2022  [facility] Cardiologist: None new  Patient Profile     57 y.o. male with schizophrenia, history of PSA (cocaine), chronic pain/opioid dependence, bipolar with substance abuse induced mood disorder, HLD, possible sleep apnea admitted 12/28/2022 with anterior STEMI. Apparently he tried to call 911 with the onset of symptoms feeling tired and fatigued on 12/20/2022.  The call would not go through so he went to his neighbor's house did not call a door.  Apparently the neighbor witnessed him pass out and then went away on the following to the ditch.  Symptoms were shoulder pain and possible syncope.  Concern for late presenting MI. Internal medicine consulted due to concerns for psychiatric issues and polysubstance abuse.  Subjective   Taken for EGD yesterday afternoon.  Mallory-Weiss tear with diffuse likely ischemic gastritis noted throughout the stomach.  IV PPI along with Carafate initiated by GI medicine.  Aspirin discontinued at least for 2 to 3 weeks.  Had 1 episode of emesis yesterday, but no hematemesis.  Overall feels better.  Shoulder still bothering but no chest pain.  Assessment & Plan    Principal Problem:   Acute ST elevation myocardial infarction (STEMI) of anterior wall (HCC) Active Problems:   Coronary artery disease involving native coronary artery of native heart with unstable angina pectoris (HCC)   Presence of drug coated stent in LAD coronary artery   Acute upper GI bleeding   Hyperlipidemia with target LDL less than 70   Acute renal failure (HCC)   Essential hypertension   BIPOLAR AFFECTIVE DISORDER   Substance induced mood disorder (HCC)   Opiate dependence, continuous (HCC)   ST elevation myocardial infarction (STEMI) (HCC)   Hematemesis with nausea   Antiplatelet or antithrombotic long-term use   Gastritis   Acute blood loss anemia  Principal Problem:   Acute ST  elevation myocardial infarction (STEMI) of anterior wall (HCC) / Coronary artery disease involving native coronary artery of native heart with unstable angina pectoris (HCC) =>   Presence of drug coated stent in LAD coronary artery Found to have severe proximal to mid LAD disease treated with DES stent.  Also has significant RCA and LCx disease as noted.  The upstream LCx lesion is not all that significant and unless there is significant ongoing pain would probably avoid further evaluation for now.  Can assess symptoms as we go.  Echo with low normal EF and not expectedly mild LAD motion abnormality. Initial plan was 1 yr DAPT --but with GI bleed will hold aspirin for at least 2 to 3 weeks until cleared by GI.  Would simply do blood Brilinta monotherapy. High-dose high intensity statin Titrated from 12.5 mg twice daily to 25 mg twice daily Lopressor, convert to Toprol on discharge (50 mg). Consider ARB if BP will tolerate-BP not yet stable for titration of further medicines.   Hyperlipidemia with target LDL less than 70 Converted to atorvastatin 80 mg daily Statin now on hold because of LFTs being elevated.- will refer to [facility]    Essential hypertension Not on any meds PTA - . Started on metoprolol 25 mg twice daily, would consolidate to Toprol 50 on discharge     Acute renal failure (HCC) creatinine on arrival was 1.6 now down to 1.1.  Resolved.   Monitor.  Likely related to dehydration.     BIPOLAR AFFECTIVE DISORDER / Substance induced mood disorder (HCC) /  Opiate dependence, continuous (HCC) => Patient denies drug any recent use Per TRH  On Methadone for maintenance -- continue home dose.     Acute GI Bleed -- 2 episodes of coffee Ground emesis over night - after PCI => started on IV PPI & GI consult seeing now.  S/p EGD diffuse gastric mucosal irritation Hgb down to 10.7 today.  Will continue to monitor. Continue to hold aspirin for 2 weeks, but continue Brilinta  monotherapy for now.  If no bleeding after 2 weeks can rechallenge with aspirin if okay with GI.   :   Per GI, will Continue IV PPI twice daily for 48 more hours. => Then transition to p.o. PPI twice daily thereafter.  Carafate 1 g 4 times daily for 2 months and then twice daily for 2 months.   Hypokalemia --improved.  Up to 4.3 today. Leukocytosis --remains afebrile.  Slightly trending down. Elevated LFTs: Seems to be a huge unusual trend for his recent MI, raise the head of being potentially related to statin myopathy.  Will reassess now that he is no longer on statin.  Would need referral to lipid clinic if he did not stop statin.  Transaminitis: Question if this is related to acute liver injury from coronary ischemia versus GI pathology or statin related effect.  It does appear that numbers are going up.  Plan to check a right upper quadrant ultrasound to exclude gallbladder disease.  Will Hold statin   Bilateral shoulder pain,  will defer to [facility] for further management.  Appreciate TR[facility] & GI Medicine Assistance --   Recommendations: Likely monitor today to ensure LFTs trend in correct direction.   Otherwise stable from CV Standpoint.    Inpatient Medications    Scheduled Meds:  Chlorhexidine Gluconate Cloth  6 each Topical Daily   docusate sodium  400 mg Oral Daily   methadone  10 mg Oral Q8H   metoprolol tartrate  12.5 mg Oral BID   pantoprazole (PROTONIX) IV  40 mg Intravenous BID   sodium chloride flush  3 mL Intravenous Q12H   sucralfate  1 g Oral TID WC & HS   ticagrelor  90 mg Oral BID   Continuous Infusions:  sodium chloride     sodium chloride     PRN Meds: sodium chloride, acetaminophen, ondansetron (ZOFRAN) IV, oxyCODONE-acetaminophen **AND** oxyCODONE, sodium chloride flush   Vital Signs    Vitals:   12/31/22 0726 12/31/22 0730 12/31/22 0800 12/31/22 1114  BP: 129/77 129/77  118/77  Pulse: 67   (!) 59  Resp:   12   Temp:   98.6 F (37 C) 98.3 F (36.8 C)   TempSrc:   Oral Oral  SpO2:    99%  Weight:      Height:        Intake/Output Summary (Last 24 hours) at 12/31/2022 1329 Last data filed at 12/31/2022 0500 Gross per 24 hour  Intake 200 ml  Output 1225 ml  Net -1025 ml      12/31/2022    3:41 AM 12/30/2022    1:45 PM 12/30/2022    6:00 AM  Last 3 Weights  Weight (lbs) 198 lb 12.8 oz 198 lb 6.6 oz 197 lb 5 oz  Weight (kg) 90.175 kg 90 kg 89.5 kg      Telemetry    Sinus rhythm.  Occasional PVCs..- Personally Reviewed  ECG    No new studies  Cardiac Studies   Cardiac Cath 12/28/2022: Acute anterior STEMI-thrombotic mid  LAD 99% (DES PCI Synergy XD 3.0 32 postdilated to 3.5 mm -> reduced to 0%.  TIMI-2 flow pre and TIMI-3 flow post)..  Proximal RCA 100% occluded with brisk bridging collaterals from proximal to mid RCA filling the entire RCA system, proximal LCx 50% and then mid LCx 100% CTO after OM 1 -> at least 2 additional OM branches filling via right to left and left to left collaterals.  D1 25%, D2 70%.  EF 55 to 65%.  Normal wall motion.  Normal EDP. Recommendations: DAPT for minimum 12 months.;  Increased intensity statin.  Consider reevaluation of other disease, depending on symptoms. Diagnostic        Intervention Dominance: Right    Echo 12/29/2022: EF 50-55% / Low Normal with Anterior wall HK c/w LAD infarct. Normal RV & valves.  Mild elevated RAP.   Physical Exam   GEN: Resting comfortably in the bedside chair.  No major issues. Neck: No JVD or bruit. Cardiac: RRR.  Normal S1 and S2.  No M/R/G.  Radial access site CDI. Respiratory: CTAB, nonlabored, good air movement GI: Soft/NT/ND/NABS.Marland Kitchen  No HSM.  RUE-nontender. MS: No C/C/E.  Shoulder still stiff but better.. Neuro: Nonfocal Psych: Normal mood and affect.  Labs    High Sensitivity Troponin:   Recent Labs  Lab 12/28/22 1831 12/28/22 2242  TROPONINIHS 916* 1,521*     Chemistry Recent Labs  Lab 12/28/22 1831 12/28/22 1900 12/29/22 0154  12/30/22 0842 12/31/22 0033  NA 138   < > 137 136 134*  K 4.2   < > 3.5 4.2 4.3  CL 106   < > 106 109 104  CO2 14*  --  18* 20* 24  GLUCOSE 151*   < > 160* 96 98  BUN 12   < > 14 13 17   CREATININE 1.60*   < > 1.11 0.87 0.90  CALCIUM 8.7*  --  8.1* 7.6* 7.8*  MG  --   --  2.5*  --   --   PROT 6.5  --   --  4.9* 4.9*  ALBUMIN 3.9  --   --  3.0* 2.8*  AST 39  --   --  462* 486*  ALT 27  --   --  338* 517*  ALKPHOS 57  --   --  53 49  BILITOT 0.9  --   --  0.9 0.7  GFRNONAA 50*  --  >60 >60 >60  ANIONGAP 18*  --  13 7 6    < > = values in this interval not displayed.    Lipids  Recent Labs  Lab 12/29/22 0154  CHOL 221*  TRIG 117  HDL 33*  LDLCALC 165*  CHOLHDL 6.7    Hematology Recent Labs  Lab 12/29/22 0154 12/29/22 1043 12/30/22 0842 12/30/22 1302 12/31/22 0033  WBC 17.1*  --  13.7*  --  12.5*  RBC 4.82  --  3.65*  --  3.44*  HGB 14.5   < > 11.3* 11.4* 10.7*  HCT 41.8   < > 32.3* 33.2* 30.8*  MCV 86.7  --  88.5  --  89.5  MCH 30.1  --  31.0  --  31.1  MCHC 34.7  --  35.0  --  34.7  RDW 12.3  --  12.1  --  12.1  PLT 264  --  180  --  166   < > = values in this interval not displayed.   Thyroid No results for input(s): "TSH", "  FREET4" in the last 168 hours.  BNPNo results for input(s): "BNP", "PROBNP" in the last 168 hours.  DDimer No results for input(s): "DDIMER" in the last 168 hours.   Radiology    DG Chest Port 1 View  Result Date: 12/28/2022 CLINICAL DATA:  Chest pain EXAM: PORTABLE CHEST 1 VIEW COMPARISON:  06/25/2021 FINDINGS: The heart size and mediastinal contours are within normal limits. Both lungs are clear. The visualized skeletal structures are unremarkable. IMPRESSION: Normal study. Electronically Signed   By: Charlett Nose M.D.   On: 12/28/2022 20:13      For questions or updates, please contact [facility] Please consult [email or web address] for contact info under        Signed, Bryan Lemma, MD  12/31/2022, 1:29 PM

## 2022-12-31 NOTE — TOC Initial Note (Signed)
Transition of Care [facility]) - Initial/Assessment Note    Patient Details  Name: [name] [name] MRN: [id number] Date of Birth: [birth year]  [facility] Care [facility]) CM/SW Contact:    Leone Haven, RN Phone Number: 12/31/2022, 3:37 PM  Clinical Narrative:                 From home alone, PCP and insurance on file, he does not have any DME at home, does not have any HH services at this time.  He gets medicatins from [employer] in [place], he states he will have transportation at [employer] by one of his family members but he is not sure of which one right now.          Patient Goals and CMS Choice            Expected Discharge Plan and Services                                              Prior Living Arrangements/Services                       Activities of Daily Living Home Assistive Devices/Equipment: None ADL Screening (condition at time of admission) Patient's cognitive ability adequate to safely complete daily activities?: Yes Is the patient deaf or have difficulty hearing?: No Does the patient have difficulty seeing, even when wearing glasses/contacts?: No Does the patient have difficulty concentrating, remembering, or making decisions?: No Patient able to express need for assistance with ADLs?: Yes Does the patient have difficulty dressing or bathing?: No Independently performs ADLs?: Yes (appropriate for developmental age) Does the patient have difficulty walking or climbing stairs?: No Weakness of Legs: None Weakness of Arms/Hands: None  Permission Sought/Granted                  Emotional Assessment              Admission diagnosis:  Acute anterior wall MI (HCC) [I21.09] Patient Active Problem List   Diagnosis Date Noted   Gastritis 12/30/2022   Acute blood loss anemia 12/30/2022   Coronary artery disease involving native coronary artery of native heart with unstable angina pectoris (HCC) 12/29/2022    Status post coronary artery stent placement 12/29/2022   Acute upper GI bleeding 12/29/2022   ST elevation myocardial infarction (STEMI) (HCC) 12/29/2022   Hematemesis with nausea 12/29/2022   Antiplatelet or antithrombotic long-term use 12/29/2022   Acute ST elevation myocardial infarction (STEMI) of anterior wall (HCC) 12/28/2022   Substance-induced psychotic disorder with hallucinations (HCC) 08/25/2022   DOE (dyspnea on exertion) 10/11/2019   COPD GOLD 0 10/11/2019   Essential hypertension 10/11/2019   Morbid obesity due to excess calories (HCC) 10/11/2019   Esophageal dysphagia 01/03/2017   Encounter for screening colonoscopy 01/03/2017   Substance induced mood disorder (HCC) 09/20/2016   Hyperprolactinemia (HCC) 03/14/2015   Bipolar 1 disorder, mixed, moderate (HCC) 03/12/2015   Opiate dependence, continuous (HCC) 03/12/2015   Cocaine use disorder, moderate, in sustained remission (HCC) 03/12/2015   Acute renal failure (HCC) 02/17/2012   Rhabdomyolysis 02/13/2012   OVERWEIGHT 01/24/2009   ARACHNOIDITIS 09/27/2008   MALAISE AND FATIGUE 09/27/2008   Hyperlipidemia with target LDL less than 70 07/01/2008   BIPOLAR AFFECTIVE DISORDER 07/01/2008   MIGRAINE HEADACHE 07/01/2008   GERD (gastroesophageal reflux disease) 07/01/2008   ARTHRITIS  07/01/2008   DEGENERATIVE DISC DISEASE 07/01/2008   LOW BACK PAIN, CHRONIC 07/01/2008   PCP:  Benetta Spar, MD Pharmacy:   [facility] [place], [place] - [address] [place] [address] Phone: [phone] Fax: [phone]     Social Determinants of Health (SDOH) Social History: SDOH Screenings   Food Insecurity: No Food Insecurity (12/29/2022)  Housing: Low Risk  (12/29/2022)  Transportation Needs: Unmet Transportation Needs (12/29/2022)  Utilities: Not At Risk (12/29/2022)  Alcohol Screen: Low Risk  (03/11/2022)  Depression (PHQ2-9): Low Risk  (03/11/2022)  Financial Resource Strain: Low Risk   (03/11/2022)  Physical Activity: Inactive (03/11/2022)  Social Connections: Moderately Isolated (03/11/2022)  Stress: No Stress Concern Present (03/11/2022)  Tobacco Use: Medium Risk (12/29/2022)   SDOH Interventions:     Readmission Risk Interventions     No data to display

## 2022-12-31 NOTE — Plan of Care (Signed)
  Problem: Nutrition: Goal: Adequate nutrition will be maintained Outcome: Completed/Met   Problem: Elimination: Goal: Will not experience complications related to urinary retention Outcome: Completed/Met   

## 2022-12-31 NOTE — Progress Notes (Signed)
PROGRESS NOTE [name] [name]  GNF:[id number] DOB: 11-19-1965 DOA: 12/28/2022 PCP: Benetta Spar, MD  Brief Narrative/Hospital Course: 57 yyom w/ chronic  b/l shoulder pain/chronic pain syndrome on methadone and oxycodone, bipolar 1 disorder not on any psych medications, polysubstance abuse including cocaine/THC, hyperlipidemia, medication noncompliance, who presented to [facility] ED as a code STEMI on 12/28/22 by [facility], picked up near his house as he was knocking on his neighbors door,reportedly he passed out, ran away, and fell into a ditch. He thought somebody was breaking into his house. His EKG was concerning for ST elevation MI.  Code STEMI was called and the patient was brought into the ED at [facility].  He received a full dose aspirin en route and 1.5 L NS IV fluid bolus via EMS. 4/28> S/p LHC- findings were concerning for acute anterior MI due to thrombotic mid LAD lesion treated with drug-eluting stent and was started on DAPT on 12/28/2022, to continue for at least 12 months. Due to history of bipolar disorder and chronic pain syndrome, TRH, hospitalist service, was asked to see in consultation and to assist with the management of his a forementioned chronic medical conditions. 4/28>>he had an episode of ground coffee emesis about a large cup full- started BID IV Protonix, GI was consulted 4/29: S/p EGD:a small Mallory-Weiss tear, significant gastritis and ulceration as if some ischemic changes. S/p Biops to rule out H. Pylori,proximal duodenum had some changes. Advised ppi IV  x 48 hrs carafate. Hold aspirin if possible 2 to 4 weeks    Subjective: Patient seen and examined this morning Resting comfortably on the bedside chair denies any nausea vomiting abdominal pain or chest pain. One of the bp reading 77/59?? False-because rechecking blood pressure was 108/64.   Labs showing leukocytosis improving hemoglobin 10.7 g, LFTs elevated AST/ALT=486/517 normal T.  Bili  Assessment and Plan: Principal Problem:   Acute ST elevation myocardial infarction (STEMI) of anterior wall (HCC) Active Problems:   Coronary artery disease involving native coronary artery of native heart with unstable angina pectoris (HCC)   Presence of drug coated stent in LAD coronary artery   Acute upper GI bleeding   Hyperlipidemia with target LDL less than 70   BIPOLAR AFFECTIVE DISORDER   Acute renal failure (HCC)   Opiate dependence, continuous (HCC)   Substance induced mood disorder (HCC)   Essential hypertension   ST elevation myocardial infarction (STEMI) (HCC)   Hematemesis with nausea   Antiplatelet or antithrombotic long-term use   Gastritis   Acute blood loss anemia  Acute STEMI of anterior wall due to thrombotic mid LAD lesion: s/p LHC w/ DES- Needs DAPT x 12 month-but due to GI bleeding now on Brilinta alone GI advised to hold off aspirin for 2-4 weeks.  On intensity statin,Lopressor 12.5 mg twice daily.  LFTs are elevated concerning for statin.  Continue plan for ongoing   Upper GI bleeding Coffee-ground emesis 5/28 history of esophagitis and gi bleeding in 2018: S/p EGD 5/29> small Mallory-Weiss tear, significant gastritis and ulceration as if some ischemic changes. S/p Biopsy to rule out H. Pylori,proximal duodenum had some changes. GI advised ppi IV BID x 48 hrs until 5/31, carafate 1 g 4 times x 2 months then twice daily for 2 months, consider repeat endoscopy in 4 to 6 months.Holding aspirin if possible 2 to 4 weeks  Holding aspirin for now, continue PPI twice daily GI following .Hb dropped some as below  Acute blood loss anemia with some drop  in hemoglobin due to upper GI bleeding,No indication for transfusion continue PPI, H&H monitoring.   Recent Labs  Lab 12/29/22 1508 12/30/22 0123 12/30/22 0842 12/30/22 1302 12/31/22 0033  HGB 13.0 11.9* 11.3* 11.4* 10.7*  HCT 38.9* 34.9* 32.3* 33.2* 30.8*   Transaminitis AST/ALT elevated 400-500 with  normal T. Bili, was normal on admission, unclear etiology and denies abdominal pain.  Suspecting in the setting of patient's ST elevation MI versus high intensity statin, holding statin at this morning. History of several gallstones in previous CT scan in 04/04/2021, check RUQ [place]. Trend lfts Recent Labs  Lab 12/28/22 1831 12/30/22 0842 12/31/22 0033  AST 39 462* 486*  ALT 27 338* 517*  ALKPHOS 57 53 49  BILITOT 0.9 0.9 0.7  PROT 6.5 4.9* 4.9*  ALBUMIN 3.9 3.0* 2.8*  INR 1.2  --   --      Bipolar 1 disorder: Off psych medication over a year, has not followed up with psych, declined psych consultation here, will need outpatient follow-up.  Currently he is AAOx3 denies suicidal ideation homicidal ideation or hallucination, agreeable with medical treatment for his MI  Chronic shoulder pain Chronic pain syndrome: Pain is controlled, continue his home methadone and oxycodone, symptomatic management    Acute metabolic encephalopathy w/ delirium: Complicated by polysubstance abuse including THC and cocaine.Currently resolved alert and oriented x 3, follows commands appropriately    Polysubstance abuse including THC and cocaine: see UDS below. Cessation counseling done 5/28- denied recent use of polysubstance.Denies use of alcohol but UDS positive for Benzo, THC, and cocaine. TOC consulted.    Component Value Date/Time   LABOPIA NONE DETECTED 12/28/2022 2221   COCAINSCRNUR POSITIVE (A) 12/28/2022 2221   LABBENZ POSITIVE (A) 12/28/2022 2221   AMPHETMU NONE DETECTED 12/28/2022 2221   THCU POSITIVE (A) 12/28/2022 2221   LABBARB NONE DETECTED 12/28/2022 2221     AKI-resolved High anion gap metabolic acidosis -resolved   Leukocytosis, possibly reactive: CXR-nonacute, UA is pending-but no fever holding off on antibiotic leukocytosis downtrending   DVT prophylaxis: SCD Code Status:   Code Status: Full Code Family Communication: plan of care discussed with patien  at bedside. Patient status is:  Inpatient because of GI bleed, STEMI  Level of care: Progressive  Dispo: The patient is from: home            Anticipated disposition: home 1 day Objective: Vitals last 24 hrs: Vitals:   12/31/22 0341 12/31/22 0344 12/31/22 0726 12/31/22 0730  BP: 103/76 103/76 129/77 129/77  Pulse: 65 72 67   Resp:  18    Temp: 98.7 F (37.1 C) 98.7 F (37.1 C)    TempSrc: Oral Oral    SpO2: 98% 97%    Weight: 90.2 kg     Height:       Weight change: 0.5 kg  Physical Examination: General exam: AAOX3, weak,older appearing HEENT:Oral mucosa moist, Ear/Nose WNL grossly, dentition normal. Respiratory system: bilaterally clear BS, no use of accessory muscle Cardiovascular system: S1 & S2 +, regular rat. Gastrointestinal system: Abdomen soft, NT,ND,BS+ Nervous System:Alert, awake, moving extremities and grossly nonfocal Extremities: LE ankle edema neg, lower extremities warm Skin: No rashes,no icterus. MSK: Normal muscle bulk,tone, power   Medications reviewed: Scheduled Meds:  atorvastatin  80 mg Oral Daily   Chlorhexidine Gluconate Cloth  6 each Topical Daily   docusate sodium  400 mg Oral Daily   methadone  10 mg Oral Q8H   metoprolol tartrate  12.5 mg Oral BID  pantoprazole (PROTONIX) IV  40 mg Intravenous BID   sodium chloride flush  3 mL Intravenous Q12H   sucralfate  1 g Oral TID WC & HS   ticagrelor  90 mg Oral BID  Continuous Infusions:  sodium chloride     sodium chloride      Diet Order             Diet Heart Room service appropriate? Yes; Fluid consistency: Thin  Diet effective 1000                  Intake/Output Summary (Last 24 hours) at 12/31/2022 1044 Last data filed at 12/31/2022 0500 Gross per 24 hour  Intake 263.2 ml  Output 1225 ml  Net -961.8 ml  Net IO Since Admission: 1,074.95 mL [12/31/22 1044]  Wt Readings from Last 3 Encounters:  12/31/22 90.2 kg  08/24/22 86.2 kg  12/21/21 95.3 kg   Unresulted Labs (From admission, onward)     Start      Ordered   01/01/23 0500  Basic metabolic panel  Daily,   R     Question:  Specimen collection method  Answer:  Lab=Lab collect   12/30/22 1227   12/31/22 0500  CBC  Daily,   R     Question:  Specimen collection method  Answer:  Lab=Lab collect   12/30/22 1105   12/29/22 0500  Lipoprotein A (LPA)  Tomorrow morning,   R        12/28/22 1952   12/29/22 0042  Urinalysis, Routine w reflex microscopic -Urine, Clean Catch  Once,   R       Question:  Specimen Source  Answer:  Urine, Clean Catch   12/29/22 0041   12/28/22 2136  Occult blood card to lab, stool  Once,   R        12/28/22 2135   12/28/22 1832  Rapid urine drug screen (hospital performed)  ONCE - STAT,   STAT        12/28/22 1831          Data Reviewed: I have personally reviewed following labs and imaging studies CBC: Recent Labs  Lab 12/28/22 1831 12/28/22 1900 12/28/22 2242 12/29/22 0154 12/29/22 1043 12/29/22 1508 12/30/22 0123 12/30/22 0842 12/30/22 1302 12/31/22 0033  WBC 15.4*  --  23.8* 17.1*  --   --   --  13.7*  --  12.5*  NEUTROABS 13.5*  --   --   --   --   --   --   --   --   --   HGB 16.1   < > 16.1 14.5   < > 13.0 11.9* 11.3* 11.4* 10.7*  HCT 47.1   < > 46.6 41.8   < > 38.9* 34.9* 32.3* 33.2* 30.8*  MCV 88.5  --  86.1 86.7  --   --   --  88.5  --  89.5  PLT 277  --  267 264  --   --   --  180  --  166   < > = values in this interval not displayed.   Basic Metabolic Panel: Recent Labs  Lab 12/28/22 1831 12/28/22 1900 12/29/22 0154 12/30/22 0842 12/31/22 0033  NA 138 141 137 136 134*  K 4.2 4.0 3.5 4.2 4.3  CL 106 107 106 109 104  CO2 14*  --  18* 20* 24  GLUCOSE 151* 129* 160* 96 98  BUN 12 12 14 13 17   CREATININE  1.60* 1.40* 1.11 0.87 0.90  CALCIUM 8.7*  --  8.1* 7.6* 7.8*  MG  --   --  2.5*  --   --   PHOS  --   --  3.7  --   --    GFR: Estimated Creatinine Clearance: 98.1 mL/min (by C-G formula based on SCr of 0.9 mg/dL). Liver Function Tests: Recent Labs  Lab 12/28/22 1831  12/30/22 0842 12/31/22 0033  AST 39 462* 486*  ALT 27 338* 517*  ALKPHOS 57 53 49  BILITOT 0.9 0.9 0.7  PROT 6.5 4.9* 4.9*  ALBUMIN 3.9 3.0* 2.8*   Recent Labs  Lab 12/28/22 1831  INR 1.2   Recent Labs    12/28/22 1831  HGBA1C 5.3   Recent Labs    12/28/22 1831 12/29/22 0154  CHOL 251* 221*  HDL 37* 33*  LDLCALC 186* 161*  TRIG 139 117  CHOLHDL 6.8 6.7   Recent Labs  Lab 12/29/22 0155  LATICACIDVEN 1.6   Recent Results (from the past 240 hour(s))  MRSA Next Gen by PCR, Nasal     Status: None   Collection Time: 12/28/22  7:52 PM   Specimen: Nasal Swab  Result Value Ref Range Status   MRSA by PCR Next Gen NOT DETECTED NOT DETECTED Final    Comment: (NOTE) The GeneXpert MRSA Assay (FDA approved for NASAL specimens only), is one component of a comprehensive MRSA colonization surveillance program. It is not intended to diagnose MRSA infection nor to guide or monitor treatment for MRSA infections. Test performance is not FDA approved in patients less than 26 years old. Performed at [facility], [address], [place], [place] [address]   Antimicrobials: Anti-infectives (From admission, onward)    None      Culture/Microbiology    Component Value Date/Time   SDES URINE, CLEAN CATCH 02/13/2012 1620   SPECREQUEST NONE 02/13/2012 1620   CULT NO GROWTH 02/13/2012 1620   REPTSTATUS 02/15/2012 FINAL 02/13/2012 1620   Radiology Studies: ECHOCARDIOGRAM COMPLETE  Result Date: 12/29/2022    ECHOCARDIOGRAM REPORT   Patient Name:   VEDANSH MURATALLA Date of Exam: 12/29/2022 Medical Rec #:  [id number]       Height:       67.0 in Accession #:    [id number]      Weight:       190.0 lb Date of Birth:  1965/11/25       BSA:          1.979 m Patient Age:    56 years        BP:           111/78 mmHg Patient Gender: M               HR:           70 bpm. Exam Location:  Inpatient Procedure: 2D Echo, Color Doppler and Cardiac Doppler Indications:    Acute MI i21.9  History:         Patient has no prior history of Echocardiogram examinations.                 CAD, COPD; Risk Factors:Hypertension, Dyslipidemia and Sleep                 Apnea.  Sonographer:    Irving Burton Senior RDCS Referring Phys: [phone] DAYNA N DUNN IMPRESSIONS  1. Study consistent with LAD territory infarct. Left ventricular ejection fraction, by estimation, is 50 to 55%. The  left ventricle has low normal function. The left ventricle demonstrates regional wall motion abnormalities (see scoring diagram/findings  for description). Left ventricular diastolic parameters are consistent with Grade I diastolic dysfunction (impaired relaxation).  2. Right ventricular systolic function is normal. The right ventricular size is normal. Tricuspid regurgitation signal is inadequate for assessing PA pressure.  3. The mitral valve is normal in structure. Trivial mitral valve regurgitation.  4. The aortic valve was not well visualized. Aortic valve regurgitation is not visualized.  5. The inferior vena cava is normal in size with <50% respiratory variability, suggesting right atrial pressure of 8 mmHg. Comparison(s): No prior Echocardiogram. FINDINGS  Left Ventricle: Study consistent with LAD territory infarct. Left ventricular ejection fraction, by estimation, is 50 to 55%. The left ventricle has low normal function. The left ventricle demonstrates regional wall motion abnormalities. The left ventricular internal cavity size was normal in size. There is no left ventricular hypertrophy. Left ventricular diastolic parameters are consistent with Grade I diastolic dysfunction (impaired relaxation).  LV Wall Scoring: The mid and distal anterior septum, apical anterior segment, and apex are hypokinetic. The anterior wall, entire lateral wall, entire inferior wall, basal anteroseptal segment, mid inferoseptal segment, and basal inferoseptal segment are normal. Right Ventricle: The right ventricular size is normal. No increase in right ventricular  wall thickness. Right ventricular systolic function is normal. Tricuspid regurgitation signal is inadequate for assessing PA pressure. Left Atrium: Left atrial size was normal in size. Right Atrium: Right atrial size was normal in size. Pericardium: There is no evidence of pericardial effusion. Mitral Valve: The mitral valve is normal in structure. Trivial mitral valve regurgitation. Tricuspid Valve: The tricuspid valve is normal in structure. Tricuspid valve regurgitation is not demonstrated. Aortic Valve: The aortic valve was not well visualized. Aortic valve regurgitation is not visualized. Pulmonic Valve: The pulmonic valve was normal in structure. Pulmonic valve regurgitation is not visualized. Aorta: The aortic root and ascending aorta are structurally normal, with no evidence of dilitation. Venous: The inferior vena cava is normal in size with less than 50% respiratory variability, suggesting right atrial pressure of 8 mmHg. IAS/Shunts: No atrial level shunt detected by color flow Doppler.  LEFT VENTRICLE PLAX 2D LVIDd:         4.30 cm     Diastology LVIDs:         3.00 cm     LV e' medial:    5.11 cm/s LV PW:         1.00 cm     LV E/e' medial:  10.2 LV IVS:        0.90 cm     LV e' lateral:   9.68 cm/s LVOT diam:     2.10 cm     LV E/e' lateral: 5.4 LV SV:         70 LV SV Index:   35 LVOT Area:     3.46 cm  LV Volumes (MOD) LV vol d, MOD A2C: 78.0 ml LV vol d, MOD A4C: 77.9 ml LV vol s, MOD A2C: 35.0 ml LV vol s, MOD A4C: 41.7 ml LV SV MOD A2C:     43.0 ml LV SV MOD A4C:     77.9 ml LV SV MOD BP:      38.8 ml RIGHT VENTRICLE             IVC RV S prime:     11.70 cm/s  IVC diam: 1.80 cm TAPSE (M-mode): 2.1 cm LEFT ATRIUM  Index        RIGHT ATRIUM           Index LA diam:        3.40 cm 1.72 cm/m   RA Area:     12.00 cm LA Vol (A2C):   42.2 ml 21.33 ml/m  RA Volume:   26.80 ml  13.54 ml/m LA Vol (A4C):   24.2 ml 12.26 ml/m LA Biplane Vol: 39.6 ml 20.01 ml/m  AORTIC VALVE LVOT Vmax:    120.00 cm/s LVOT Vmean:  73.100 cm/s LVOT VTI:    0.202 m  AORTA Ao Root diam: 3.10 cm Ao Asc diam:  2.60 cm MITRAL VALVE MV Area (PHT): 4.89 cm    SHUNTS MV Decel Time: 155 msec    Systemic VTI:  0.20 m MV E velocity: 51.90 cm/s  Systemic Diam: 2.10 cm MV A velocity: 88.60 cm/s MV E/A ratio:  0.59 Riley Lam MD Electronically signed by Riley Lam MD Signature Date/Time: 12/29/2022/12:17:22 PM    Final      LOS: 3 days   Lanae Boast, MD [facility]  12/31/2022, 10:44 AM

## 2022-12-31 NOTE — Progress Notes (Signed)
   12/30/22 2015  Assess: MEWS Score  BP 108/64 (rechecked BP)  Pulse Rate 82  Resp 18  SpO2 97 %  O2 Device Room Air  Assess: MEWS Score  MEWS Temp 0  MEWS Systolic 0  MEWS Pulse 0  MEWS RR 0  MEWS LOC 0  MEWS Score 0  MEWS Score Color Green  Assess: if the MEWS score is Yellow or Red  Were vital signs taken at a resting state? Yes  Focused Assessment No change from prior assessment  Does the patient meet 2 or more of the SIRS criteria? No  MEWS guidelines implemented  No, vital signs rechecked (this is more accurate of what his BP has been)  Assess: SIRS CRITERIA  SIRS Temperature  0  SIRS Pulse 0  SIRS Respirations  0  SIRS WBC 1  SIRS Score Sum  1

## 2023-01-01 ENCOUNTER — Telehealth: Payer: Self-pay | Admitting: Student

## 2023-01-01 DIAGNOSIS — I2109 ST elevation (STEMI) myocardial infarction involving other coronary artery of anterior wall: Secondary | ICD-10-CM | POA: Diagnosis not present

## 2023-01-01 LAB — HEPATIC FUNCTION PANEL
ALT: 349 U/L — ABNORMAL HIGH (ref 0–44)
AST: 310 U/L — ABNORMAL HIGH (ref 15–41)
Albumin: 3.1 g/dL — ABNORMAL LOW (ref 3.5–5.0)
Alkaline Phosphatase: 61 U/L (ref 38–126)
Bilirubin, Direct: 0.2 mg/dL (ref 0.0–0.2)
Indirect Bilirubin: 0.8 mg/dL (ref 0.3–0.9)
Total Bilirubin: 1 mg/dL (ref 0.3–1.2)
Total Protein: 5.5 g/dL — ABNORMAL LOW (ref 6.5–8.1)

## 2023-01-01 LAB — BASIC METABOLIC PANEL
Anion gap: 9 (ref 5–15)
BUN: 15 mg/dL (ref 6–20)
CO2: 24 mmol/L (ref 22–32)
Calcium: 8.2 mg/dL — ABNORMAL LOW (ref 8.9–10.3)
Chloride: 100 mmol/L (ref 98–111)
Creatinine, Ser: 0.85 mg/dL (ref 0.61–1.24)
GFR, Estimated: >60 mL/min (ref >60)
Glucose, Bld: 88 mg/dL (ref 70–99)
Potassium: 3.9 mmol/L (ref 3.5–5.1)
Sodium: 133 mmol/L — ABNORMAL LOW (ref 135–145)

## 2023-01-01 LAB — CBC
HCT: 33.5 % — ABNORMAL LOW (ref 39.0–52.0)
Hemoglobin: 11.6 g/dL — ABNORMAL LOW (ref 13.0–17.0)
MCH: 30.5 pg (ref 26.0–34.0)
MCHC: 34.6 g/dL (ref 30.0–36.0)
MCV: 88.2 fL (ref 80.0–100.0)
Platelets: 204 10*3/uL (ref 150–400)
RBC: 3.8 MIL/uL — ABNORMAL LOW (ref 4.22–5.81)
RDW: 11.9 % (ref 11.5–15.5)
WBC: 12.8 10*3/uL — ABNORMAL HIGH (ref 4.0–10.5)
nRBC: 0 % (ref 0.0–0.2)

## 2023-01-01 MED ORDER — DOCUSATE SODIUM 100 MG PO CAPS: 400.0000 mg | ORAL_CAPSULE | Freq: Every day | ORAL | 0 refills | Status: DC

## 2023-01-01 MED ORDER — PANTOPRAZOLE SODIUM 40 MG PO TBEC: 40.0000 mg | DELAYED_RELEASE_TABLET | Freq: Two times a day (BID) | ORAL | 0 refills | Status: DC

## 2023-01-01 MED ORDER — METOPROLOL TARTRATE 25 MG PO TABS: 12.5000 mg | ORAL_TABLET | Freq: Two times a day (BID) | ORAL | 0 refills | Status: DC

## 2023-01-01 MED ORDER — SUCRALFATE 1 G PO TABS: 1.0000 g | ORAL_TABLET | Freq: Three times a day (TID) | ORAL | 1 refills | Status: DC

## 2023-01-01 MED ORDER — TICAGRELOR 90 MG PO TABS: 90.0000 mg | ORAL_TABLET | Freq: Two times a day (BID) | ORAL | 0 refills | Status: AC

## 2023-01-01 NOTE — Telephone Encounter (Signed)
   Transition of Care Follow-up Phone Call Request    Patient Name: Orvile Cotterill Date of Birth: [birth year] Date of Encounter: 01/01/2023  Primary Care Provider:  Benetta Spar, MD Primary Cardiologist:  Bryan Lemma, MD  Sheliah Plane has been scheduled for a transition of care follow up appointment with a [facility] provider:  Appointment with Edd Fabian, NP on 01/12/2023.  Please reach out to [facility] within 48 hours to confirm appointment and review transition of care protocol questionnaire.  Ellsworth Lennox, PA-C  01/01/2023, 9:48 AM

## 2023-01-01 NOTE — Progress Notes (Signed)
CARDIAC REHAB PHASE I   Pt declines assistance with ambulation. Educational needs met, no questions or concerns. Referral sent to [facility] for CRP2.    Woodroe Chen, RN BSN 01/01/2023 9:44 AM

## 2023-01-01 NOTE — Discharge Summary (Signed)
Physician Discharge Summary  [name] [name] ZOX:[id number] DOB: January 12, 1966 DOA: 12/28/2022  PCP: Benetta Spar, MD  Admit date: 12/28/2022 Discharge date: 01/01/2023 Recommendations for Outpatient Follow-up:  Follow up with PCP in 1 weeks-call for appointment Please obtain BMP/CBC/LFT in one week Fu with Dr Jena Gauss GI In 2 wks Fu cardiology on 6/12 as scheduled   Discharge Dispo: Home Discharge Condition: Stable Code Status:   Code Status: Full Code Diet recommendation:  Diet Order             Diet Heart Room service appropriate? Yes; Fluid consistency: Thin  Diet effective 1000                    Brief/Interim Summary: 57 yyom w/ chronic  b/l shoulder pain/chronic pain syndrome on methadone and oxycodone, bipolar 1 disorder not on any psych medications, polysubstance abuse including cocaine/THC, hyperlipidemia, medication noncompliance, who presented to [facility] ED as a code STEMI on 12/28/22 by [facility], picked up near his house as he was knocking on his neighbors door,reportedly he passed out, ran away, and fell into a ditch. He thought somebody was breaking into his house. His EKG was concerning for ST elevation MI.  Code STEMI was called and the patient was brought into the ED at [facility].  He received a full dose aspirin en route and 1.5 L NS IV fluid bolus via EMS. 4/28> S/p LHC- findings were concerning for acute anterior MI due to thrombotic mid LAD lesion treated with drug-eluting stent and was started on DAPT on 12/28/2022, to continue for at least 12 months. Due to history of bipolar disorder and chronic pain syndrome, TRH, hospitalist service, was asked to see in consultation and to assist with the management of his a forementioned chronic medical conditions. 4/28>>he had an episode of ground coffee emesis about a large cup full- started BID IV Protonix, GI was consulted 4/29: S/p EGD:a small Mallory-Weiss tear, significant gastritis and ulceration as if  some ischemic changes. S/p Biops to rule out H. Pylori,proximal duodenum had some changes. Advised ppi IV  x 48 hrs carafate. Hold aspirin if possible 2 to 4 weeks Patient noted to have bump in his troponin was normal on admission underwent ultrasound of the liver negative finding besides cholelithiasis, asymptomatic, was monitored additional day LFTs now downtrending AST/ALT pealed 486/517> 310/349.  ALT normal bilirubin normal. CBC medically stable and will be discharged home with instruction for trending outpatient LFTs and follow-up with cardiology   Discharge Diagnoses:  Principal Problem:   Acute ST elevation myocardial infarction (STEMI) of anterior wall (HCC) Active Problems:   Coronary artery disease involving native coronary artery of native heart with unstable angina pectoris (HCC)   Status post coronary artery stent placement   Acute upper GI bleeding   Hyperlipidemia with target LDL less than 70   BIPOLAR AFFECTIVE DISORDER   Acute renal failure (HCC)   Opiate dependence, continuous (HCC)   Substance induced mood disorder (HCC)   Essential hypertension   ST elevation myocardial infarction (STEMI) (HCC)   Hematemesis with nausea   Antiplatelet or antithrombotic long-term use   Gastritis   Acute blood loss anemia  Acute STEMI of anterior wall due to thrombotic mid LAD lesion: s/p LHC w/ DES- Needs DAPT x 12 month-but due to GI bleeding now on Brilinta alone GI advised to hold off aspirin for 2-4 weeks.  On intensity statin,Lopressor 12.5 mg twice daily.  LFTs are elevated but downtrending, statin remains on  hold, will need to be followed up by cardiology before resuming statin, patient informed    Upper GI bleeding Coffee-ground emesis 5/28 history of esophagitis and gi bleeding in 2018: S/p EGD 5/29> small Mallory-Weiss tear, significant gastritis and ulceration as if some ischemic changes. S/p Biopsy to rule out H. Pylori,proximal duodenum had some changes.  Continue PPI  twice daily,, carafate 1 g 4 times x 2 months then twice daily for 2 months, consider repeat endoscopy in 4 to 6 months.Holding aspirin if possible 2 to 4 weeks -cardiology will need to follow-up and decide on resuming aspirin as outpatient, discharge informed to the patient-he has appointment coming up on  6/12  Acute blood loss anemia with some drop in hemoglobin due to upper GI bleeding,No indication for transfusion continue PPI, hemoglobin is stable uptrending now. Recent Labs  Lab 12/30/22 0123 12/30/22 0842 12/30/22 1302 12/31/22 0033 01/01/23 0043  HGB 11.9* 11.3* 11.4* 10.7* 11.6*  HCT 34.9* 32.3* 33.2* 30.8* 33.5*   Transaminitis:  AST/ALT elevated 400-500 with normal T. Bili, was normal on admission, monitored additional day LFTs now downtrending AST/ALT pealed 486/517> 310/349.  ultrasound of the liver negative finding besides cholelithiasis, asymptomatic.continue to hold statin follow-up outpatient with PCP.   Recent Labs  Lab 12/28/22 1831 12/30/22 0842 12/31/22 0033 01/01/23 0058  AST 39 462* 486* 310*  ALT 27 338* 517* 349*  ALKPHOS 57 53 49 61  BILITOT 0.9 0.9 0.7 1.0  PROT 6.5 4.9* 4.9* 5.5*  ALBUMIN 3.9 3.0* 2.8* 3.1*  INR 1.2  --   --   --     Bipolar 1 disorder:Off psych medication over a year, has not followed up with psych, declined psych consultation here, will need outpatient follow-up.  Currently he is AAOx3 denies suicidal ideation homicidal ideation or hallucination, agreeable with medical treatment for his MI  Chronic shoulder pain Chronic pain syndrome: Pain is controlled, continue his home methadone and oxycodone, symptomatic management -currently controlled   Acute metabolic encephalopathy w/ delirium: Complicated by polysubstance abuse including THC and cocaine.Currently resolved alert and oriented x 3, follows commands appropriately    Polysubstance abuse including THC and cocaine: see UDS below. Cessation counseling done 5/28- denied recent use of  polysubstance.Denies use of alcohol but UDS positive for Benzo, THC, and cocaine. TOC consulted.    Component Value Date/Time   LABOPIA NONE DETECTED 12/28/2022 2221   COCAINSCRNUR POSITIVE (A) 12/28/2022 2221   LABBENZ POSITIVE (A) 12/28/2022 2221   AMPHETMU NONE DETECTED 12/28/2022 2221   THCU POSITIVE (A) 12/28/2022 2221   LABBARB NONE DETECTED 12/28/2022 2221     AKI-resolved High anion gap metabolic acidosis -resolved   Leukocytosis, possibly reactive: CXR-nonacute, UA is pending-but no fever holding off on antibiotic leukocytosis downtrending    Consults: Cardiology GI Subjective: Alert awake oriented resting comfortably has a bruise on his right upper arm but no bleeding.  Discharge Exam: Vitals:   01/01/23 0431 01/01/23 0810  BP: 133/81 (!) 135/90  Pulse: 89 74  Resp: 20 20  Temp: 98.7 F (37.1 C) 98.1 F (36.7 C)  SpO2: 98% 100%   General: Pt is alert, awake, not in acute distress Cardiovascular: RRR, S1/S2 +, no rubs, no gallops Respiratory: CTA bilaterally, no wheezing, no rhonchi Abdominal: Soft, NT, ND, bowel sounds + Extremities: no edema, no cyanosis  Discharge Instructions  Discharge Instructions     AMB Referral to Advanced Lipid Disorders Clinic   Complete by: As directed    Internal Lipid Clinic Referral  Scheduling  Internal lipid clinic referrals are providers within [facility], who wish to refer established patients for routine management (help in starting PCSK9 inhibitor therapy) or advanced therapies.  Internal MD referral criteria:              1. All patients with LDL>190 mg/dL  2. All patients with Triglycerides >500 mg/dL  3. Patients with suspected or confirmed heterozygous familial hyperlipidemia (HeFH) or homozygous familial hyperlipidemia (HoFH)  4. Patients with family history of suspicious for genetic dyslipidemia desiring genetic testing  5. Patients refractory to standard guideline based therapy  6. Patients with statin intolerance  (failed 2 statins, one of which must be a high potency statin)  7. Patients who the provider desires to be seen by MD   Internal PharmD referral criteria:   1. Follow-up patients for medication management  2. Follow-up for compliance monitoring  3. Patients for drug education  4. Patients with statin intolerance  5. PCSK9 inhibitor education and prior authorization approvals  6. Patients with triglycerides <500 mg/dL  External Lipid Clinic Referral  External lipid clinic referrals are for providers outside of [facility], considered new clinic patients - automatically routed to MD schedule   Amb Referral to Cardiac Rehabilitation   Complete by: As directed    Diagnosis:  Coronary Stents STEMI     After initial evaluation and assessments completed: Virtual Based Care may be provided alone or in conjunction with Phase 2 Cardiac Rehab based on patient barriers.: Yes   Intensive Cardiac Rehabilitation (ICR) [facility] location only OR Traditional Cardiac Rehabilitation (TCR) *If criteria for ICR are not met will enroll in TCR [facility] only): Yes   Discharge instructions   Complete by: As directed    Please call call MD or return to ER for similar or worsening recurring problem that brought you to hospital or if any fever,nausea/vomiting,abdominal pain, uncontrolled pain, chest pain,  shortness of breath or any other alarming symptoms.  Please check LFT in 1 week. And discuss with your cardiology abt resuming statins and aspirin  Please follow-up your doctor as instructed in a week time and call the office for appointment.  Please avoid alcohol, smoking, or any other illicit substance and maintain healthy habits including taking your regular medications as prescribed.  You were cared for by a hospitalist during your hospital stay. If you have any questions about your discharge medications or the care you received while you were in the hospital after you are discharged, you can call the unit and  ask to speak with the hospitalist on call if the hospitalist that took care of you is not available.  Once you are discharged, your primary care physician will handle any further medical issues. Please note that NO REFILLS for any discharge medications will be authorized once you are discharged, as it is imperative that you return to your primary care physician (or establish a relationship with a primary care physician if you do not have one) for your aftercare needs so that they can reassess your need for medications and monitor your lab values   Increase activity slowly   Complete by: As directed       Allergies as of 01/01/2023       Reactions   Ibuprofen Other (See Comments)   Irritates stomach        Medication List     STOP taking these medications    simvastatin 40 MG tablet Commonly known as: ZOCOR       TAKE  these medications    baclofen 20 MG tablet Commonly known as: LIORESAL Take 20 mg by mouth 3 (three) times daily as needed.   docusate sodium 100 MG capsule Commonly known as: COLACE Take 4 capsules (400 mg total) by mouth daily.   methadone 10 MG tablet Commonly known as: DOLOPHINE Take 10 mg by mouth in the morning, at noon, and at bedtime. Edd Arbour- PA   metoprolol tartrate 25 MG tablet Commonly known as: LOPRESSOR Take 0.5 tablets (12.5 mg total) by mouth 2 (two) times daily.   naloxone 4 MG/0.1ML Liqd nasal spray kit Commonly known as: NARCAN SMARTSIG:Spray(s) Both Nares   oxyCODONE-acetaminophen 10-325 MG tablet Commonly known as: PERCOCET Take 1 tablet by mouth every 4 (four) hours as needed for pain.   pantoprazole 40 MG tablet Commonly known as: Protonix Take 1 tablet (40 mg total) by mouth 2 (two) times daily.   sucralfate 1 g tablet Commonly known as: CARAFATE Take 1 tablet (1 g total) by mouth 4 (four) times daily -  with meals and at bedtime. After 2 months cont bid   ticagrelor 90 MG Tabs tablet Commonly known as:  BRILINTA Take 1 tablet (90 mg total) by mouth 2 (two) times daily.        Follow-up Information     Ronney Asters, NP Follow up on 01/12/2023.   Specialty: Cardiology Why: Cardiology Hospital Follow-up on 01/12/2023 at 1:55 PM. Contact information: [address] [place] [address] [phone]         Benetta Spar, MD Follow up.   Specialty: Internal Medicine Why: Please follow up in a week. Contact information: [address] [place] [address] [phone]                Allergies  Allergen Reactions   Ibuprofen Other (See Comments)    Irritates stomach    The results of significant diagnostics from this hospitalization (including imaging, microbiology, ancillary and laboratory) are listed below for reference.    Microbiology: Recent Results (from the past 240 hour(s))  MRSA Next Gen by PCR, Nasal     Status: None   Collection Time: 12/28/22  7:52 PM   Specimen: Nasal Swab  Result Value Ref Range Status   MRSA by PCR Next Gen NOT DETECTED NOT DETECTED Final    Comment: (NOTE) The GeneXpert MRSA Assay (FDA approved for NASAL specimens only), is one component of a comprehensive MRSA colonization surveillance program. It is not intended to diagnose MRSA infection nor to guide or monitor treatment for MRSA infections. Test performance is not FDA approved in patients less than 35 years old. Performed at [facility], [address], [place], [place] [address]     Procedures/Studies: US ABDOMEN LIMITED RUQ (LIVER/GB)  Result Date: 12/31/2022 CLINICAL DATA:  [id number] Abnormal LFTs [id number] EXAM: ULTRASOUND ABDOMEN LIMITED RIGHT UPPER QUADRANT COMPARISON:  CT 04/04/2021 FINDINGS: Gallbladder: Cholelithiasis with largest intraluminal stone measuring 1.4 cm. No wall thickening. Negative sonographic Murphy sign. Common bile duct: Diameter: 4.3 mm, normal.  No intrahepatic ductal dilation. Liver: No focal lesion  identified. Within normal limits in parenchymal echogenicity. Portal vein is patent on color Doppler imaging with normal direction of blood flow towards the liver. Other: None. IMPRESSION: Cholelithiasis.  No evidence of acute cholecystitis. No evidence of biliary obstruction. Electronically Signed   By: Caprice Renshaw M.D.   On: 12/31/2022 13:48   ECHOCARDIOGRAM COMPLETE  Result Date: 12/29/2022    ECHOCARDIOGRAM REPORT  Patient Name:   COTT SERENE Date of Exam: 12/29/2022 Medical Rec #:  [id number]       Height:       67.0 in Accession #:    [id number]      Weight:       190.0 lb Date of Birth:  March 26, 1966       BSA:          1.979 m Patient Age:    56 years        BP:           111/78 mmHg Patient Gender: M               HR:           70 bpm. Exam Location:  Inpatient Procedure: 2D Echo, Color Doppler and Cardiac Doppler Indications:    Acute MI i21.9  History:        Patient has no prior history of Echocardiogram examinations.                 CAD, COPD; Risk Factors:Hypertension, Dyslipidemia and Sleep                 Apnea.  Sonographer:    Irving Burton Senior RDCS Referring Phys: [phone] DAYNA N DUNN IMPRESSIONS  1. Study consistent with LAD territory infarct. Left ventricular ejection fraction, by estimation, is 50 to 55%. The left ventricle has low normal function. The left ventricle demonstrates regional wall motion abnormalities (see scoring diagram/findings  for description). Left ventricular diastolic parameters are consistent with Grade I diastolic dysfunction (impaired relaxation).  2. Right ventricular systolic function is normal. The right ventricular size is normal. Tricuspid regurgitation signal is inadequate for assessing PA pressure.  3. The mitral valve is normal in structure. Trivial mitral valve regurgitation.  4. The aortic valve was not well visualized. Aortic valve regurgitation is not visualized.  5. The inferior vena cava is normal in size with <50% respiratory variability, suggesting right  atrial pressure of 8 mmHg. Comparison(s): No prior Echocardiogram. FINDINGS  Left Ventricle: Study consistent with LAD territory infarct. Left ventricular ejection fraction, by estimation, is 50 to 55%. The left ventricle has low normal function. The left ventricle demonstrates regional wall motion abnormalities. The left ventricular internal cavity size was normal in size. There is no left ventricular hypertrophy. Left ventricular diastolic parameters are consistent with Grade I diastolic dysfunction (impaired relaxation).  LV Wall Scoring: The mid and distal anterior septum, apical anterior segment, and apex are hypokinetic. The anterior wall, entire lateral wall, entire inferior wall, basal anteroseptal segment, mid inferoseptal segment, and basal inferoseptal segment are normal. Right Ventricle: The right ventricular size is normal. No increase in right ventricular wall thickness. Right ventricular systolic function is normal. Tricuspid regurgitation signal is inadequate for assessing PA pressure. Left Atrium: Left atrial size was normal in size. Right Atrium: Right atrial size was normal in size. Pericardium: There is no evidence of pericardial effusion. Mitral Valve: The mitral valve is normal in structure. Trivial mitral valve regurgitation. Tricuspid Valve: The tricuspid valve is normal in structure. Tricuspid valve regurgitation is not demonstrated. Aortic Valve: The aortic valve was not well visualized. Aortic valve regurgitation is not visualized. Pulmonic Valve: The pulmonic valve was normal in structure. Pulmonic valve regurgitation is not visualized. Aorta: The aortic root and ascending aorta are structurally normal, with no evidence of dilitation. Venous: The inferior vena cava is normal in size with less than 50% respiratory variability, suggesting right atrial pressure  of 8 mmHg. IAS/Shunts: No atrial level shunt detected by color flow Doppler.  LEFT VENTRICLE PLAX 2D LVIDd:         4.30 cm      Diastology LVIDs:         3.00 cm     LV e' medial:    5.11 cm/s LV PW:         1.00 cm     LV E/e' medial:  10.2 LV IVS:        0.90 cm     LV e' lateral:   9.68 cm/s LVOT diam:     2.10 cm     LV E/e' lateral: 5.4 LV SV:         70 LV SV Index:   35 LVOT Area:     3.46 cm  LV Volumes (MOD) LV vol d, MOD A2C: 78.0 ml LV vol d, MOD A4C: 77.9 ml LV vol s, MOD A2C: 35.0 ml LV vol s, MOD A4C: 41.7 ml LV SV MOD A2C:     43.0 ml LV SV MOD A4C:     77.9 ml LV SV MOD BP:      38.8 ml RIGHT VENTRICLE             IVC RV S prime:     11.70 cm/s  IVC diam: 1.80 cm TAPSE (M-mode): 2.1 cm LEFT ATRIUM             Index        RIGHT ATRIUM           Index LA diam:        3.40 cm 1.72 cm/m   RA Area:     12.00 cm LA Vol (A2C):   42.2 ml 21.33 ml/m  RA Volume:   26.80 ml  13.54 ml/m LA Vol (A4C):   24.2 ml 12.26 ml/m LA Biplane Vol: 39.6 ml 20.01 ml/m  AORTIC VALVE LVOT Vmax:   120.00 cm/s LVOT Vmean:  73.100 cm/s LVOT VTI:    0.202 m  AORTA Ao Root diam: 3.10 cm Ao Asc diam:  2.60 cm MITRAL VALVE MV Area (PHT): 4.89 cm    SHUNTS MV Decel Time: 155 msec    Systemic VTI:  0.20 m MV E velocity: 51.90 cm/s  Systemic Diam: 2.10 cm MV A velocity: 88.60 cm/s MV E/A ratio:  0.59 Riley Lam MD Electronically signed by Riley Lam MD Signature Date/Time: 12/29/2022/12:17:22 PM    Final    DG Chest Port 1 View  Result Date: 12/28/2022 CLINICAL DATA:  Chest pain EXAM: PORTABLE CHEST 1 VIEW COMPARISON:  06/25/2021 FINDINGS: The heart size and mediastinal contours are within normal limits. Both lungs are clear. The visualized skeletal structures are unremarkable. IMPRESSION: Normal study. Electronically Signed   By: Charlett Nose M.D.   On: 12/28/2022 20:13   CARDIAC CATHETERIZATION  Result Date: 12/28/2022   Prox RCA lesion is 100% stenosed.  Bridging collaterals from proximal RCA to mid RCA.   Mid Cx lesion is 100% stenosed.  Left to left collaterals.  Prior to the mid vessel occlusion, three is a large OM  branch.  Unable to cross with workhorse wire.   1st Diag lesion is 25% stenosed.   2nd Diag lesion is 70% stenosed.   Prox Cx lesion is 50% stenosed.   Mid LAD lesion is 99% stenosed.  A drug-eluting stent was successfully placed using a STENT SYNERGY XD 3.0X32, postdilated to 3.5 mm.   Post  intervention, there is a 0% residual stenosis.   The left ventricular systolic function is normal.   LV end diastolic pressure is normal.   The left ventricular ejection fraction is 55-65% by visual estimate.   There is no aortic valve stenosis. Acute anterior MI due to thrombotic mid LAD lesion.  Successfully treated with drug-eluting stent.  Dual antiplatelet therapy for at least 12 months.  Increase intensity of statin.  I stressed the importance of compliance with his medication.  Other residual disease will require aggressive medical therapy.  Depending on symptoms, will consider repeat catheterization to address the other disease. Results conveyed to [name] [id number]    Labs: BNP (last 3 results) No results for input(s): "BNP" in the last 8760 hours. Basic Metabolic Panel: Recent Labs  Lab 12/28/22 1831 12/28/22 1900 12/29/22 0154 12/30/22 0842 12/31/22 0033 01/01/23 0043  NA 138 141 137 136 134* 133*  K 4.2 4.0 3.5 4.2 4.3 3.9  CL 106 107 106 109 104 100  CO2 14*  --  18* 20* 24 24  GLUCOSE 151* 129* 160* 96 98 88  BUN 12 12 14 13 17 15   CREATININE 1.60* 1.40* 1.11 0.87 0.90 0.85  CALCIUM 8.7*  --  8.1* 7.6* 7.8* 8.2*  MG  --   --  2.5*  --   --   --   PHOS  --   --  3.7  --   --   --   Liver Function Tests: Recent Labs  Lab 12/28/22 1831 12/30/22 0842 12/31/22 0033 01/01/23 0058  AST 39 462* 486* 310*  ALT 27 338* 517* 349*  ALKPHOS 57 53 49 61  BILITOT 0.9 0.9 0.7 1.0  PROT 6.5 4.9* 4.9* 5.5*  ALBUMIN 3.9 3.0* 2.8* 3.1*  CBC: Recent Labs  Lab 12/28/22 1831 12/28/22 1900 12/28/22 2242 12/29/22 0154 12/29/22 1043 12/30/22 0123 12/30/22 0842 12/30/22 1302  12/31/22 0033 01/01/23 0043  WBC 15.4*  --  23.8* 17.1*  --   --  13.7*  --  12.5* 12.8*  NEUTROABS 13.5*  --   --   --   --   --   --   --   --   --   HGB 16.1   < > 16.1 14.5   < > 11.9* 11.3* 11.4* 10.7* 11.6*  HCT 47.1   < > 46.6 41.8   < > 34.9* 32.3* 33.2* 30.8* 33.5*  MCV 88.5  --  86.1 86.7  --   --  88.5  --  89.5 88.2  PLT 277  --  267 264  --   --  180  --  166 204   < > = values in this interval not displayed.  Cardiac Enzymes:No results for input(s): "CKTOTAL", "CKMB", "CKMBINDEX", "TROPONINI" in the last 168 hours. UJW:JXBJYNW input(s): "POCBNP" CBG: Recent Labs  Lab 12/28/22 1947  GLUCAP 108*      Component Value Date/Time   COLORURINE YELLOW 08/24/2022 0740   APPEARANCEUR CLEAR 08/24/2022 0740   LABSPEC 1.021 08/24/2022 0740   PHURINE 5.0 08/24/2022 0740   GLUCOSEU NEGATIVE 08/24/2022 0740   HGBUR NEGATIVE 08/24/2022 0740   BILIRUBINUR NEGATIVE 08/24/2022 0740   KETONESUR 80 (A) 08/24/2022 0740   PROTEINUR 30 (A) 08/24/2022 0740   UROBILINOGEN 0.2 02/13/2012 1620   NITRITE NEGATIVE 08/24/2022 0740   LEUKOCYTESUR NEGATIVE 08/24/2022 0740   Sepsis Labs Recent Labs  Lab 12/29/22 0154 12/30/22 0842 12/31/22 0033 01/01/23 0043  WBC 17.1* 13.7* 12.5*  12.8*   Microbiology Recent Results (from the past 240 hour(s))  MRSA Next Gen by PCR, Nasal     Status: None   Collection Time: 12/28/22  7:52 PM   Specimen: Nasal Swab  Result Value Ref Range Status   MRSA by PCR Next Gen NOT DETECTED NOT DETECTED Final    Comment: (NOTE) The GeneXpert MRSA Assay (FDA approved for NASAL specimens only), is one component of a comprehensive MRSA colonization surveillance program. It is not intended to diagnose MRSA infection nor to guide or monitor treatment for MRSA infections. Test performance is not FDA approved in patients less than 29 years old. Performed at [facility], [address], [place], [place] [address]    Time coordinating discharge: 25  minutes  SIGNED: Lanae Boast, MD  [facility] 01/01/2023, 11:28 AM  If 7PM-7AM, please contact night-coverage [email or web address]

## 2023-01-01 NOTE — Progress Notes (Signed)
This RN heard a raised voice coming from this patient's room. Walked in to find patient repeating repeating "what the hell are we doing this at 4:30 in the morning for?". Nurse technician educated on purpose behind and vital signs, weights, and the timing of them. Patient asked for name of nurse technician: provided by nurse technician and reinforced by nametag and whiteboard. Patient demanded NT remove mask to "see [your] face", standing in NT's intimate space and inspecting face. This RN and primary RN assisted in verbal de-escalation and education. Patient is in bed in no acute distress as of time of writing.

## 2023-01-01 NOTE — Progress Notes (Signed)
Ccmd notified of dc order. Piv dcd. Sites unremarkable. Dc instructions reviewed with patient. All belongings at bedside. Patient now waiting on ride.

## 2023-01-01 NOTE — Progress Notes (Signed)
Rounding Note    Patient Name: [name] [name] Date of Encounter: 01/01/2023  [facility] Cardiologist: [name] Lemma, MD   Subjective   A bit upset overnight.  Now calm.  Inpatient Medications    Scheduled Meds:  Chlorhexidine Gluconate Cloth  6 each Topical Daily   docusate sodium  400 mg Oral Daily   methadone  10 mg Oral Q8H   metoprolol tartrate  12.5 mg Oral BID   pantoprazole (PROTONIX) IV  40 mg Intravenous BID   sodium chloride flush  3 mL Intravenous Q12H   sucralfate  1 g Oral TID WC & HS   ticagrelor  90 mg Oral BID   Continuous Infusions:  sodium chloride     sodium chloride     PRN Meds: sodium chloride, acetaminophen, ondansetron (ZOFRAN) IV, oxyCODONE-acetaminophen **AND** oxyCODONE, sodium chloride flush   Vital Signs    Vitals:   12/31/22 1948 01/01/23 0011 01/01/23 0431 01/01/23 0810  BP: 138/76 121/72 133/81 (!) 135/90  Pulse: 68 74 89 74  Resp: 15 17 20 20   Temp: 98.7 F (37.1 C) 98.5 F (36.9 C) 98.7 F (37.1 C) 98.1 F (36.7 C)  TempSrc: Oral Oral Oral Oral  SpO2: 99% 99% 98% 100%  Weight:   88.5 kg   Height:        Intake/Output Summary (Last 24 hours) at 01/01/2023 0945 Last data filed at 01/01/2023 8315 Gross per 24 hour  Intake 243 ml  Output 1250 ml  Net -1007 ml      01/01/2023    4:31 AM 12/31/2022    3:41 AM 12/30/2022    1:45 PM  Last 3 Weights  Weight (lbs) 195 lb 1.7 oz 198 lb 12.8 oz 198 lb 6.6 oz  Weight (kg) 88.5 kg 90.175 kg 90 kg      Telemetry    No adverse arrhythmias- Personally Reviewed  ECG    No new- Personally Reviewed  Physical Exam   GEN: No acute distress.  Comfortable in bed Neck: No JVD Cardiac: RRR, no murmurs, rubs, or gallops.  Respiratory: Clear to auscultation bilaterally. GI: Soft, nontender, non-distended  MS: No edema; No deformity. Neuro:  Nonfocal  Psych: Normal affect   Labs    High Sensitivity Troponin:   Recent Labs  Lab 12/28/22 1831 12/28/22 2242   TROPONINIHS 916* 1,521*     Chemistry Recent Labs  Lab 12/29/22 0154 12/30/22 0842 12/31/22 0033 01/01/23 0043 01/01/23 0058  NA 137 136 134* 133*  --   K 3.5 4.2 4.3 3.9  --   CL 106 109 104 100  --   CO2 18* 20* 24 24  --   GLUCOSE 160* 96 98 88  --   BUN 14 13 17 15   --   CREATININE 1.11 0.87 0.90 0.85  --   CALCIUM 8.1* 7.6* 7.8* 8.2*  --   MG 2.5*  --   --   --   --   PROT  --  4.9* 4.9*  --  5.5*  ALBUMIN  --  3.0* 2.8*  --  3.1*  AST  --  462* 486*  --  310*  ALT  --  338* 517*  --  349*  ALKPHOS  --  53 49  --  61  BILITOT  --  0.9 0.7  --  1.0  GFRNONAA >60 >60 >60 >60  --   ANIONGAP 13 7 6 9   --     Lipids  Recent  Labs  Lab 12/29/22 0154  CHOL 221*  TRIG 117  HDL 33*  LDLCALC 165*  CHOLHDL 6.7    Hematology Recent Labs  Lab 12/30/22 0842 12/30/22 1302 12/31/22 0033 01/01/23 0043  WBC 13.7*  --  12.5* 12.8*  RBC 3.65*  --  3.44* 3.80*  HGB 11.3* 11.4* 10.7* 11.6*  HCT 32.3* 33.2* 30.8* 33.5*  MCV 88.5  --  89.5 88.2  MCH 31.0  --  31.1 30.5  MCHC 35.0  --  34.7 34.6  RDW 12.1  --  12.1 11.9  PLT 180  --  166 204   Thyroid No results for input(s): "TSH", "FREET4" in the last 168 hours.  BNPNo results for input(s): "BNP", "PROBNP" in the last 168 hours.  DDimer No results for input(s): "DDIMER" in the last 168 hours.   Radiology    US ABDOMEN LIMITED RUQ (LIVER/GB)  Result Date: 12/31/2022 CLINICAL DATA:  [id number] Abnormal LFTs [id number] EXAM: ULTRASOUND ABDOMEN LIMITED RIGHT UPPER QUADRANT COMPARISON:  CT 04/04/2021 FINDINGS: Gallbladder: Cholelithiasis with largest intraluminal stone measuring 1.4 cm. No wall thickening. Negative sonographic Murphy sign. Common bile duct: Diameter: 4.3 mm, normal.  No intrahepatic ductal dilation. Liver: No focal lesion identified. Within normal limits in parenchymal echogenicity. Portal vein is patent on color Doppler imaging with normal direction of blood flow towards the liver. Other: None. IMPRESSION:  Cholelithiasis.  No evidence of acute cholecystitis. No evidence of biliary obstruction. Electronically Signed   By: Caprice Renshaw M.D.   On: 12/31/2022 13:48    Cardiac Studies   Cardiac Studies & Procedures   CARDIAC CATHETERIZATION  CARDIAC CATHETERIZATION 12/28/2022  Narrative   Prox RCA lesion is 100% stenosed.  Bridging collaterals from proximal RCA to mid RCA.   Mid Cx lesion is 100% stenosed.  Left to left collaterals.  Prior to the mid vessel occlusion, three is a large OM branch.  Unable to cross with workhorse wire.   1st Diag lesion is 25% stenosed.   2nd Diag lesion is 70% stenosed.   Prox Cx lesion is 50% stenosed.   Mid LAD lesion is 99% stenosed.  A drug-eluting stent was successfully placed using a STENT SYNERGY XD 3.0X32, postdilated to 3.5 mm.   Post intervention, there is a 0% residual stenosis.   The left ventricular systolic function is normal.   LV end diastolic pressure is normal.   The left ventricular ejection fraction is 55-65% by visual estimate.   There is no aortic valve stenosis.  Acute anterior MI due to thrombotic mid LAD lesion.  Successfully treated with drug-eluting stent.  Dual antiplatelet therapy for at least 12 months.  Increase intensity of statin.  I stressed the importance of compliance with his medication.  Other residual disease will require aggressive medical therapy.  Depending on symptoms, will consider repeat catheterization to address the other disease.  Results conveyed to his son [name] [name] [phone]  Findings Coronary Findings Diagnostic  Dominance: Right  Left Anterior Descending Mid LAD lesion is 99% stenosed. Vessel is the culprit lesion. The lesion is type C.  First Diagonal Branch 1st Diag lesion is 25% stenosed.  Second Diagonal Branch 2nd Diag lesion is 70% stenosed.  Left Circumflex Vessel is large. Collaterals Dist Cx filled by collaterals from 2nd Mrg.  Prox Cx lesion is 50% stenosed. The lesion is eccentric and  irregular. Mid Cx lesion is 100% stenosed. The lesion is chronically occluded with left-to-left collateral flow.  Third Obtuse Marginal Branch Collaterals 3rd Mrg filled  by collaterals from RPDA.  Right Coronary Artery Prox RCA lesion is 100% stenosed. The lesion is chronically occluded with bridging and right-to-right collateral flow.  Right Ventricular Branch Collaterals RV Branch filled by collaterals from Ost RCA.  Intervention  Mid LAD lesion Stent CATH LAUNCHER 6FR EBU3.5 guide catheter was inserted. Lesion crossed with guidewire using a WIRE RUNTHROUGH .[id number]. Pre-stent angioplasty was performed using a BALLN EMERGE MR 2.5X12. A drug-eluting stent was successfully placed using a STENT SYNERGY XD 3.0X32. Stent strut is well apposed. Post-stent angioplasty was performed using a BALLN [place] EMERGE MR 3.5X15. Maximum pressure:  20 atm. After the LAD intervention, the wire was directed into the circumflex just to ensure that the 100% occlusion behaved like a chronic total occlusion.  The run-through wire was easily deflected into the large OM.  This was clearly a chronic occlusion and not part of the acute presentation. Post-Intervention Lesion Assessment The intervention was successful. Pre-interventional TIMI flow is 2. Post-intervention TIMI flow is 3. No complications occurred at this lesion. IC verapamil was given to improve flow distally. There is a 0% residual stenosis post intervention.     ECHOCARDIOGRAM  ECHOCARDIOGRAM COMPLETE 12/29/2022  Narrative ECHOCARDIOGRAM REPORT    Patient Name:   [name] [name] Date of Exam: 12/29/2022 Medical Rec #:  [id number]       Height:       67.0 in Accession #:    [id number]      Weight:       190.0 lb Date of Birth:  1965-09-11       BSA:          1.979 m Patient Age:    56 years        BP:           111/78 mmHg Patient Gender: M               HR:           70 bpm. Exam Location:  Inpatient  Procedure: 2D Echo, Color Doppler and  Cardiac Doppler  Indications:    Acute MI i21.9  History:        Patient has no prior history of Echocardiogram examinations. CAD, COPD; Risk Factors:Hypertension, Dyslipidemia and Sleep Apnea.  Sonographer:    Irving Burton Senior RDCS Referring Phys: [phone] DAYNA N DUNN  IMPRESSIONS   1. Study consistent with LAD territory infarct. Left ventricular ejection fraction, by estimation, is 50 to 55%. The left ventricle has low normal function. The left ventricle demonstrates regional wall motion abnormalities (see scoring diagram/findings for description). Left ventricular diastolic parameters are consistent with Grade I diastolic dysfunction (impaired relaxation). 2. Right ventricular systolic function is normal. The right ventricular size is normal. Tricuspid regurgitation signal is inadequate for assessing PA pressure. 3. The mitral valve is normal in structure. Trivial mitral valve regurgitation. 4. The aortic valve was not well visualized. Aortic valve regurgitation is not visualized. 5. The inferior vena cava is normal in size with <50% respiratory variability, suggesting right atrial pressure of 8 mmHg.  Comparison(s): No prior Echocardiogram.  FINDINGS Left Ventricle: Study consistent with LAD territory infarct. Left ventricular ejection fraction, by estimation, is 50 to 55%. The left ventricle has low normal function. The left ventricle demonstrates regional wall motion abnormalities. The left ventricular internal cavity size was normal in size. There is no left ventricular hypertrophy. Left ventricular diastolic parameters are consistent with Grade I diastolic dysfunction (impaired relaxation).   LV Wall Scoring: The mid and distal  anterior septum, apical anterior segment, and apex are hypokinetic. The anterior wall, entire lateral wall, entire inferior wall, basal anteroseptal segment, mid inferoseptal segment, and basal inferoseptal segment are normal.  Right Ventricle: The right  ventricular size is normal. No increase in right ventricular wall thickness. Right ventricular systolic function is normal. Tricuspid regurgitation signal is inadequate for assessing PA pressure.  Left Atrium: Left atrial size was normal in size.  Right Atrium: Right atrial size was normal in size.  Pericardium: There is no evidence of pericardial effusion.  Mitral Valve: The mitral valve is normal in structure. Trivial mitral valve regurgitation.  Tricuspid Valve: The tricuspid valve is normal in structure. Tricuspid valve regurgitation is not demonstrated.  Aortic Valve: The aortic valve was not well visualized. Aortic valve regurgitation is not visualized.  Pulmonic Valve: The pulmonic valve was normal in structure. Pulmonic valve regurgitation is not visualized.  Aorta: The aortic root and ascending aorta are structurally normal, with no evidence of dilitation.  Venous: The inferior vena cava is normal in size with less than 50% respiratory variability, suggesting right atrial pressure of 8 mmHg.  IAS/Shunts: No atrial level shunt detected by color flow Doppler.   LEFT VENTRICLE PLAX 2D LVIDd:         4.30 cm     Diastology LVIDs:         3.00 cm     LV e' medial:    5.11 cm/s LV PW:         1.00 cm     LV E/e' medial:  10.2 LV IVS:        0.90 cm     LV e' lateral:   9.68 cm/s LVOT diam:     2.10 cm     LV E/e' lateral: 5.4 LV SV:         70 LV SV Index:   35 LVOT Area:     3.46 cm  LV Volumes (MOD) LV vol d, MOD A2C: 78.0 ml LV vol d, MOD A4C: 77.9 ml LV vol s, MOD A2C: 35.0 ml LV vol s, MOD A4C: 41.7 ml LV SV MOD A2C:     43.0 ml LV SV MOD A4C:     77.9 ml LV SV MOD BP:      38.8 ml  RIGHT VENTRICLE             IVC RV S prime:     11.70 cm/s  IVC diam: 1.80 cm TAPSE (M-mode): 2.1 cm  LEFT ATRIUM             Index        RIGHT ATRIUM           Index LA diam:        3.40 cm 1.72 cm/m   RA Area:     12.00 cm LA Vol (A2C):   42.2 ml 21.33 ml/m  RA Volume:    26.80 ml  13.54 ml/m LA Vol (A4C):   24.2 ml 12.26 ml/m LA Biplane Vol: 39.6 ml 20.01 ml/m AORTIC VALVE LVOT Vmax:   120.00 cm/s LVOT Vmean:  73.100 cm/s LVOT VTI:    0.202 m  AORTA Ao Root diam: 3.10 cm Ao Asc diam:  2.60 cm  MITRAL VALVE MV Area (PHT): 4.89 cm    SHUNTS MV Decel Time: 155 msec    Systemic VTI:  0.20 m MV E velocity: 51.90 cm/s  Systemic Diam: 2.10 cm MV A velocity: 88.60 cm/s MV E/A ratio:  0.59  Riley Lam MD Electronically signed by Riley Lam MD Signature Date/Time: 12/29/2022/12:17:22 PM    Final              Patient Profile     56 y.o. male anterior wall STEMI, bipolar disorder, Mallory-Weiss tear upper GI bleed, polysubstance abuse  Assessment & Plan    Anterior STEMI, EF 50% - Brilinta monotherapy.  Not on aspirin.  Mallory-Weiss tear.  Upper GI bleed.  Appreciate GIs assistance.  Initial plan was 1 year dual antiplatelet therapy but now focused Brilinta monotherapy. -Currently receiving metoprolol.  Careful with possible history of cocaine use.  Upper GI bleed - Mallory-Weiss tear.  Watch for any worsening bleeding.  Recent drug-eluting stent to LAD  Hyperlipidemia - Statin high intensity on hold secondary to elevated LFTs.  Elevated LFTs - Gently coming down.  Statin on hold.  No new cardiac recommendations at this time.  When applicable on discharge, consider cardiac rehab if able.  We will go ahead and sign off.  We will set up follow-up with Dr. Olen Pel.   For questions or updates, please contact [facility] Please consult [email or web address] for contact info under        Signed, Donato Schultz, MD  01/01/2023, 9:45 AM

## 2023-01-02 ENCOUNTER — Encounter (HOSPITAL_COMMUNITY): Payer: Self-pay | Admitting: Gastroenterology

## 2023-01-03 ENCOUNTER — Telehealth: Payer: Self-pay | Admitting: *Deleted

## 2023-01-03 ENCOUNTER — Telehealth: Payer: Self-pay

## 2023-01-03 DIAGNOSIS — I2109 ST elevation (STEMI) myocardial infarction involving other coronary artery of anterior wall: Secondary | ICD-10-CM

## 2023-01-03 DIAGNOSIS — I10 Essential (primary) hypertension: Secondary | ICD-10-CM

## 2023-01-03 NOTE — Telephone Encounter (Signed)
Call patient and he hung up during introduction. Call back and he does not answer. No voicemail pick up

## 2023-01-03 NOTE — Progress Notes (Signed)
  Care Coordination   Note   01/03/2023 Name: Kasch Pasternack MRN: [id number] DOB: [birth year]  Neeson Susman is a 57 y.o. year old male who sees Fanta, Wayland Salinas, MD for primary care. I reached out to Sheliah Plane by phone today to offer care coordination services.  Mr. Kappel was given information about Care Coordination services today including:   The Care Coordination services include support from the care team which includes your Nurse Coordinator, Clinical Social Worker, or Pharmacist.  The Care Coordination team is here to help remove barriers to the health concerns and goals most important to you. Care Coordination services are voluntary, and the patient may decline or stop services at any time by request to their care team member.   Care Coordination Consent Status: Patient did not agree to participate in care coordination services at this time.  Follow up plan:None  Encounter Outcome:  Pt. Refused  [facility] Coordination Care Guide  Direct Dial: [phone]

## 2023-01-04 NOTE — Telephone Encounter (Signed)
Called patient and I thought the phone disconnected. I called back and he stated he hung up and does not want to talk to [place].

## 2023-01-07 DIAGNOSIS — M549 Dorsalgia, unspecified: Secondary | ICD-10-CM | POA: Diagnosis not present

## 2023-01-07 DIAGNOSIS — I10 Essential (primary) hypertension: Secondary | ICD-10-CM | POA: Diagnosis not present
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
## 2023-01-12 ENCOUNTER — Ambulatory Visit: Payer: Medicare HMO | Attending: General Practice | Admitting: General Practice

## 2023-01-17 DIAGNOSIS — M5136 Other intervertebral disc degeneration, lumbar region: Secondary | ICD-10-CM | POA: Diagnosis not present

## 2023-01-17 DIAGNOSIS — E559 Vitamin D deficiency, unspecified: Secondary | ICD-10-CM | POA: Diagnosis not present

## 2023-01-17 DIAGNOSIS — R030 Elevated blood-pressure reading, without diagnosis of hypertension: Secondary | ICD-10-CM | POA: Diagnosis not present

## 2023-01-17 DIAGNOSIS — M79604 Pain in right leg: Secondary | ICD-10-CM | POA: Diagnosis not present

## 2023-01-17 DIAGNOSIS — Z79899 Other long term (current) drug therapy: Secondary | ICD-10-CM | POA: Diagnosis not present

## 2023-01-17 DIAGNOSIS — J449 Chronic obstructive pulmonary disease, unspecified: Secondary | ICD-10-CM | POA: Diagnosis not present

## 2023-01-17 DIAGNOSIS — G894 Chronic pain syndrome: Secondary | ICD-10-CM | POA: Diagnosis not present

## 2023-01-17 DIAGNOSIS — M545 Low back pain, unspecified: Secondary | ICD-10-CM | POA: Diagnosis not present

## 2023-01-17 DIAGNOSIS — F1120 Opioid dependence, uncomplicated: Secondary | ICD-10-CM | POA: Diagnosis not present

## 2023-01-17 DIAGNOSIS — Z6829 Body mass index (BMI) 29.0-29.9, adult: Secondary | ICD-10-CM | POA: Diagnosis not present

## 2023-02-05 DIAGNOSIS — M549 Dorsalgia, unspecified: Secondary | ICD-10-CM | POA: Diagnosis not present

## 2023-02-05 DIAGNOSIS — I10 Essential (primary) hypertension: Secondary | ICD-10-CM | POA: Diagnosis not present

## 2023-02-14 DIAGNOSIS — F1120 Opioid dependence, uncomplicated: Secondary | ICD-10-CM | POA: Diagnosis not present

## 2023-02-14 DIAGNOSIS — R030 Elevated blood-pressure reading, without diagnosis of hypertension: Secondary | ICD-10-CM | POA: Diagnosis not present

## 2023-02-14 DIAGNOSIS — M545 Low back pain, unspecified: Secondary | ICD-10-CM | POA: Diagnosis not present

## 2023-02-14 DIAGNOSIS — L989 Disorder of the skin and subcutaneous tissue, unspecified: Secondary | ICD-10-CM | POA: Diagnosis not present

## 2023-02-14 DIAGNOSIS — E559 Vitamin D deficiency, unspecified: Secondary | ICD-10-CM | POA: Diagnosis not present

## 2023-02-14 DIAGNOSIS — Z79899 Other long term (current) drug therapy: Secondary | ICD-10-CM | POA: Diagnosis not present

## 2023-02-14 DIAGNOSIS — M5136 Other intervertebral disc degeneration, lumbar region: Secondary | ICD-10-CM | POA: Diagnosis not present

## 2023-02-14 DIAGNOSIS — G894 Chronic pain syndrome: Secondary | ICD-10-CM | POA: Diagnosis not present

## 2023-02-14 DIAGNOSIS — M79604 Pain in right leg: Secondary | ICD-10-CM | POA: Diagnosis not present

## 2023-02-14 DIAGNOSIS — Z6829 Body mass index (BMI) 29.0-29.9, adult: Secondary | ICD-10-CM | POA: Diagnosis not present

## 2023-03-08 DIAGNOSIS — I10 Essential (primary) hypertension: Secondary | ICD-10-CM | POA: Diagnosis not present

## 2023-03-08 DIAGNOSIS — M549 Dorsalgia, unspecified: Secondary | ICD-10-CM | POA: Diagnosis not present

## 2023-03-14 DIAGNOSIS — M545 Low back pain, unspecified: Secondary | ICD-10-CM | POA: Diagnosis not present

## 2023-03-14 DIAGNOSIS — M79604 Pain in right leg: Secondary | ICD-10-CM | POA: Diagnosis not present

## 2023-03-14 DIAGNOSIS — E559 Vitamin D deficiency, unspecified: Secondary | ICD-10-CM | POA: Diagnosis not present

## 2023-03-14 DIAGNOSIS — G894 Chronic pain syndrome: Secondary | ICD-10-CM | POA: Diagnosis not present

## 2023-03-14 DIAGNOSIS — J449 Chronic obstructive pulmonary disease, unspecified: Secondary | ICD-10-CM | POA: Diagnosis not present

## 2023-03-14 DIAGNOSIS — F1120 Opioid dependence, uncomplicated: Secondary | ICD-10-CM | POA: Diagnosis not present

## 2023-03-14 DIAGNOSIS — M5136 Other intervertebral disc degeneration, lumbar region: Secondary | ICD-10-CM | POA: Diagnosis not present

## 2023-03-14 DIAGNOSIS — Z79899 Other long term (current) drug therapy: Secondary | ICD-10-CM | POA: Diagnosis not present

## 2023-03-14 DIAGNOSIS — Z6829 Body mass index (BMI) 29.0-29.9, adult: Secondary | ICD-10-CM | POA: Diagnosis not present

## 2023-03-14 DIAGNOSIS — R030 Elevated blood-pressure reading, without diagnosis of hypertension: Secondary | ICD-10-CM | POA: Diagnosis not present

## 2023-03-18 DIAGNOSIS — Z79899 Other long term (current) drug therapy: Secondary | ICD-10-CM | POA: Diagnosis not present

## 2023-04-01 DIAGNOSIS — F32A Depression, unspecified: Secondary | ICD-10-CM | POA: Diagnosis not present

## 2023-04-01 DIAGNOSIS — M545 Low back pain, unspecified: Secondary | ICD-10-CM | POA: Diagnosis not present

## 2023-04-01 DIAGNOSIS — F419 Anxiety disorder, unspecified: Secondary | ICD-10-CM | POA: Diagnosis not present

## 2023-04-01 DIAGNOSIS — R892 Abnormal level of other drugs, medicaments and biological substances in specimens from other organs, systems and tissues: Secondary | ICD-10-CM | POA: Diagnosis not present

## 2023-04-01 DIAGNOSIS — Z79899 Other long term (current) drug therapy: Secondary | ICD-10-CM | POA: Diagnosis not present

## 2023-04-01 DIAGNOSIS — G8929 Other chronic pain: Secondary | ICD-10-CM | POA: Diagnosis not present

## 2023-04-06 DIAGNOSIS — Z79899 Other long term (current) drug therapy: Secondary | ICD-10-CM | POA: Diagnosis not present

## 2023-04-08 DIAGNOSIS — M549 Dorsalgia, unspecified: Secondary | ICD-10-CM | POA: Diagnosis not present

## 2023-04-08 DIAGNOSIS — I10 Essential (primary) hypertension: Secondary | ICD-10-CM | POA: Diagnosis not present

## 2023-04-29 DIAGNOSIS — Z79899 Other long term (current) drug therapy: Secondary | ICD-10-CM | POA: Diagnosis not present

## 2023-04-29 DIAGNOSIS — R6889 Other general symptoms and signs: Secondary | ICD-10-CM | POA: Diagnosis not present

## 2023-04-29 DIAGNOSIS — R892 Abnormal level of other drugs, medicaments and biological substances in specimens from other organs, systems and tissues: Secondary | ICD-10-CM | POA: Diagnosis not present

## 2023-04-29 DIAGNOSIS — M545 Low back pain, unspecified: Secondary | ICD-10-CM | POA: Diagnosis not present

## 2023-04-29 DIAGNOSIS — F32A Depression, unspecified: Secondary | ICD-10-CM | POA: Diagnosis not present

## 2023-04-29 DIAGNOSIS — G8929 Other chronic pain: Secondary | ICD-10-CM | POA: Diagnosis not present

## 2023-04-29 DIAGNOSIS — F419 Anxiety disorder, unspecified: Secondary | ICD-10-CM | POA: Diagnosis not present

## 2023-05-03 DIAGNOSIS — Z79899 Other long term (current) drug therapy: Secondary | ICD-10-CM | POA: Diagnosis not present

## 2023-05-08 DIAGNOSIS — M549 Dorsalgia, unspecified: Secondary | ICD-10-CM | POA: Diagnosis not present

## 2023-05-08 DIAGNOSIS — I10 Essential (primary) hypertension: Secondary | ICD-10-CM | POA: Diagnosis not present

## 2023-05-27 DIAGNOSIS — F32A Depression, unspecified: Secondary | ICD-10-CM | POA: Diagnosis not present

## 2023-05-27 DIAGNOSIS — R892 Abnormal level of other drugs, medicaments and biological substances in specimens from other organs, systems and tissues: Secondary | ICD-10-CM | POA: Diagnosis not present

## 2023-05-27 DIAGNOSIS — F419 Anxiety disorder, unspecified: Secondary | ICD-10-CM | POA: Diagnosis not present

## 2023-05-27 DIAGNOSIS — M545 Low back pain, unspecified: Secondary | ICD-10-CM | POA: Diagnosis not present

## 2023-05-27 DIAGNOSIS — Z79899 Other long term (current) drug therapy: Secondary | ICD-10-CM | POA: Diagnosis not present

## 2023-05-27 DIAGNOSIS — G8929 Other chronic pain: Secondary | ICD-10-CM | POA: Diagnosis not present

## 2023-06-08 DIAGNOSIS — I10 Essential (primary) hypertension: Secondary | ICD-10-CM | POA: Diagnosis not present

## 2023-06-08 DIAGNOSIS — M549 Dorsalgia, unspecified: Secondary | ICD-10-CM | POA: Diagnosis not present

## 2023-06-24 DIAGNOSIS — Z79899 Other long term (current) drug therapy: Secondary | ICD-10-CM | POA: Diagnosis not present

## 2023-06-24 DIAGNOSIS — M545 Low back pain, unspecified: Secondary | ICD-10-CM | POA: Diagnosis not present

## 2023-06-24 DIAGNOSIS — F419 Anxiety disorder, unspecified: Secondary | ICD-10-CM | POA: Diagnosis not present

## 2023-06-24 DIAGNOSIS — M25511 Pain in right shoulder: Secondary | ICD-10-CM | POA: Diagnosis not present

## 2023-06-24 DIAGNOSIS — F32A Depression, unspecified: Secondary | ICD-10-CM | POA: Diagnosis not present

## 2023-06-24 DIAGNOSIS — R892 Abnormal level of other drugs, medicaments and biological substances in specimens from other organs, systems and tissues: Secondary | ICD-10-CM | POA: Diagnosis not present

## 2023-06-24 DIAGNOSIS — M25512 Pain in left shoulder: Secondary | ICD-10-CM | POA: Diagnosis not present

## 2023-06-24 DIAGNOSIS — G8929 Other chronic pain: Secondary | ICD-10-CM | POA: Diagnosis not present

## 2023-06-27 DIAGNOSIS — Z79899 Other long term (current) drug therapy: Secondary | ICD-10-CM | POA: Diagnosis not present

## 2023-07-08 DIAGNOSIS — I10 Essential (primary) hypertension: Secondary | ICD-10-CM | POA: Diagnosis not present

## 2023-07-08 DIAGNOSIS — M549 Dorsalgia, unspecified: Secondary | ICD-10-CM | POA: Diagnosis not present

## 2023-07-22 DIAGNOSIS — R892 Abnormal level of other drugs, medicaments and biological substances in specimens from other organs, systems and tissues: Secondary | ICD-10-CM | POA: Diagnosis not present

## 2023-07-22 DIAGNOSIS — F32A Depression, unspecified: Secondary | ICD-10-CM | POA: Diagnosis not present

## 2023-07-22 DIAGNOSIS — M545 Low back pain, unspecified: Secondary | ICD-10-CM | POA: Diagnosis not present

## 2023-07-22 DIAGNOSIS — F419 Anxiety disorder, unspecified: Secondary | ICD-10-CM | POA: Diagnosis not present

## 2023-07-22 DIAGNOSIS — M25512 Pain in left shoulder: Secondary | ICD-10-CM | POA: Diagnosis not present

## 2023-07-22 DIAGNOSIS — G8929 Other chronic pain: Secondary | ICD-10-CM | POA: Diagnosis not present

## 2023-07-22 DIAGNOSIS — M25511 Pain in right shoulder: Secondary | ICD-10-CM | POA: Diagnosis not present

## 2023-07-22 DIAGNOSIS — Z79899 Other long term (current) drug therapy: Secondary | ICD-10-CM | POA: Diagnosis not present

## 2023-08-19 DIAGNOSIS — M545 Low back pain, unspecified: Secondary | ICD-10-CM | POA: Diagnosis not present

## 2023-08-19 DIAGNOSIS — F419 Anxiety disorder, unspecified: Secondary | ICD-10-CM | POA: Diagnosis not present

## 2023-08-19 DIAGNOSIS — F32A Depression, unspecified: Secondary | ICD-10-CM | POA: Diagnosis not present

## 2023-08-19 DIAGNOSIS — G8929 Other chronic pain: Secondary | ICD-10-CM | POA: Diagnosis not present

## 2023-08-19 DIAGNOSIS — M25511 Pain in right shoulder: Secondary | ICD-10-CM | POA: Diagnosis not present

## 2023-08-19 DIAGNOSIS — M25512 Pain in left shoulder: Secondary | ICD-10-CM | POA: Diagnosis not present

## 2023-08-19 DIAGNOSIS — Z79899 Other long term (current) drug therapy: Secondary | ICD-10-CM | POA: Diagnosis not present

## 2023-08-23 DIAGNOSIS — Z79899 Other long term (current) drug therapy: Secondary | ICD-10-CM | POA: Diagnosis not present

## 2024-07-21 ENCOUNTER — Encounter (HOSPITAL_COMMUNITY): Payer: Self-pay

## 2024-07-21 ENCOUNTER — Other Ambulatory Visit: Payer: Self-pay

## 2024-07-21 ENCOUNTER — Emergency Department (HOSPITAL_COMMUNITY)
Admission: EM | Admit: 2024-07-21 | Discharge: 2024-07-23 | Disposition: A | Discharge status: Other Acute Inpatient Hospital | Payer: Medicare (Managed Care) | Attending: Emergency Medicine | Admitting: Emergency Medicine

## 2024-07-21 DIAGNOSIS — I2510 Atherosclerotic heart disease of native coronary artery without angina pectoris: Secondary | ICD-10-CM | POA: Insufficient documentation

## 2024-07-21 DIAGNOSIS — Z91148 Patient's other noncompliance with medication regimen for other reason: Secondary | ICD-10-CM | POA: Insufficient documentation

## 2024-07-21 DIAGNOSIS — F1910 Other psychoactive substance abuse, uncomplicated: Secondary | ICD-10-CM | POA: Insufficient documentation

## 2024-07-21 DIAGNOSIS — F319 Bipolar disorder, unspecified: Secondary | ICD-10-CM | POA: Insufficient documentation

## 2024-07-21 DIAGNOSIS — I10 Essential (primary) hypertension: Secondary | ICD-10-CM | POA: Insufficient documentation

## 2024-07-21 DIAGNOSIS — D72829 Elevated white blood cell count, unspecified: Secondary | ICD-10-CM | POA: Insufficient documentation

## 2024-07-21 LAB — COMPREHENSIVE METABOLIC PANEL WITH GFR
ALT: 14 U/L (ref 0–44)
AST: 21 U/L (ref 15–41)
Albumin: 4.7 g/dL (ref 3.5–5.0)
Alkaline Phosphatase: 112 U/L (ref 38–126)
Anion gap: 16 — ABNORMAL HIGH (ref 5–15)
BUN: 19 mg/dL (ref 6–20)
CO2: 22 mmol/L (ref 22–32)
Calcium: 9.5 mg/dL (ref 8.9–10.3)
Chloride: 101 mmol/L (ref 98–111)
Creatinine, Ser: 0.93 mg/dL (ref 0.61–1.24)
GFR, Estimated: >60 mL/min
Glucose, Bld: 130 mg/dL — ABNORMAL HIGH (ref 70–99)
Potassium: 3.5 mmol/L (ref 3.5–5.1)
Sodium: 139 mmol/L (ref 135–145)
Total Bilirubin: 0.6 mg/dL (ref 0.0–1.2)
Total Protein: 7.7 g/dL (ref 6.5–8.1)

## 2024-07-21 LAB — URINALYSIS, ROUTINE W REFLEX MICROSCOPIC
Bacteria, UA: NONE SEEN
Bilirubin Urine: NEGATIVE
Glucose, UA: NEGATIVE mg/dL
Hgb urine dipstick: NEGATIVE
Ketones, ur: NEGATIVE mg/dL
Leukocytes,Ua: NEGATIVE
Nitrite: NEGATIVE
Protein, ur: 30 mg/dL — AB
Specific Gravity, Urine: 1.019 (ref 1.005–1.030)
pH: 6 (ref 5.0–8.0)

## 2024-07-21 LAB — CBC WITH DIFFERENTIAL/PLATELET
Abs Immature Granulocytes: 0.03 K/uL (ref 0.00–0.07)
Basophils Absolute: 0.1 K/uL (ref 0.0–0.1)
Basophils Relative: 1 %
Eosinophils Absolute: 0.1 K/uL (ref 0.0–0.5)
Eosinophils Relative: 1 %
HCT: 47.3 % (ref 39.0–52.0)
Hemoglobin: 16.5 g/dL (ref 13.0–17.0)
Immature Granulocytes: 0 %
Lymphocytes Relative: 21 %
Lymphs Abs: 2.5 K/uL (ref 0.7–4.0)
MCH: 29.9 pg (ref 26.0–34.0)
MCHC: 34.9 g/dL (ref 30.0–36.0)
MCV: 85.7 fL (ref 80.0–100.0)
Monocytes Absolute: 0.8 K/uL (ref 0.1–1.0)
Monocytes Relative: 7 %
Neutro Abs: 8.1 K/uL — ABNORMAL HIGH (ref 1.7–7.7)
Neutrophils Relative %: 70 %
Platelets: 275 K/uL (ref 150–400)
RBC: 5.52 MIL/uL (ref 4.22–5.81)
RDW: 12.2 % (ref 11.5–15.5)
WBC: 11.5 K/uL — ABNORMAL HIGH (ref 4.0–10.5)
nRBC: 0 % (ref 0.0–0.2)

## 2024-07-21 LAB — URINE DRUG SCREEN
Amphetamines: NEGATIVE
Barbiturates: NEGATIVE
Benzodiazepines: POSITIVE — AB
Cocaine: NEGATIVE
Fentanyl: NEGATIVE
Methadone Scn, Ur: POSITIVE — AB
Opiates: NEGATIVE
Tetrahydrocannabinol: NEGATIVE

## 2024-07-21 LAB — ETHANOL: Alcohol, Ethyl (B): <15 mg/dL

## 2024-07-21 MED ORDER — NICOTINE 21 MG/24HR TD PT24
21.0000 mg | MEDICATED_PATCH | Freq: Every day | TRANSDERMAL | Status: DC
  Administered 2024-07-21 – 2024-07-23 (×3): 21 mg via TRANSDERMAL
  Filled 2024-07-21 (×3): qty 1

## 2024-07-21 MED ORDER — LORAZEPAM 1 MG PO TABS: 1.0000 mg | ORAL_TABLET | ORAL | Status: DC | PRN

## 2024-07-21 MED ORDER — ALUM & MAG HYDROXIDE-SIMETH 200-200-20 MG/5ML PO SUSP: 30.0000 mL | Freq: Four times a day (QID) | ORAL | Status: AC | PRN

## 2024-07-21 MED ORDER — RISPERIDONE 1 MG PO TBDP: 2.0000 mg | ORAL_TABLET | Freq: Three times a day (TID) | ORAL | Status: AC | PRN

## 2024-07-21 MED ORDER — ACETAMINOPHEN 325 MG PO TABS: 650.0000 mg | ORAL_TABLET | ORAL | Status: AC | PRN

## 2024-07-21 MED ORDER — METHADONE HCL 10 MG PO TABS
10.0000 mg | ORAL_TABLET | Freq: Three times a day (TID) | ORAL | Status: DC
  Administered 2024-07-22: 10 mg via ORAL

## 2024-07-21 MED ORDER — ZIPRASIDONE MESYLATE 20 MG IM SOLR
20.0000 mg | INTRAMUSCULAR | Status: DC | PRN
  Filled 2024-07-21: qty 20

## 2024-07-21 MED ORDER — OXYCODONE-ACETAMINOPHEN 10-325 MG PO TABS: 1.0000 | ORAL_TABLET | ORAL | Status: DC | PRN

## 2024-07-21 MED ORDER — METOPROLOL TARTRATE 25 MG PO TABS
12.5000 mg | ORAL_TABLET | Freq: Two times a day (BID) | ORAL | Status: DC
  Administered 2024-07-22 – 2024-07-23 (×3): 12.5 mg via ORAL
  Filled 2024-07-21 (×4): qty 1

## 2024-07-21 NOTE — ED Notes (Signed)
Pt ambulated to the bathroom with NT.
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
## 2024-07-21 NOTE — ED Notes (Signed)
 Pt is requesting ibuprofen , a pink oxy, and methodone that pt states they take daily. They are requesting that they want to talk to the dr [facility]) so they can get discharged. Pt wants to call [facility] transportation to take pt home. This RN explained the process to pt as far as talking with BH. This RN told pt I would let their request about medications known to EDP.  And [facility] could not be contacted at this time as pt is not up for discharge. Pt verbalized understanding at this time.

## 2024-07-21 NOTE — ED Notes (Addendum)
 Pt allowed to make a telephone call to [facility].' Became increasingly agitated and loud during the call. Demanded that he be provided his Methadone , and Roxy - states he has been on these meds for 40 years. Pt then ambulated to the bathroom with NT. Reviewed with patient meds ordered for this visit. He refuses anything other than methadone , roxy and ibuprofen .

## 2024-07-21 NOTE — ED Notes (Signed)
 Patient is getting very agitated and verbal with staff at this time. [employer] and [employer] called to bedside at this time.

## 2024-07-21 NOTE — ED Notes (Addendum)
 Pt refused BP medication. States that they do not take it. The only reason BP is high is d/t pt being stressed and not taking their medication today. Pt states no one is going to make them take a medication that they do not take  Pt states takes 1 ibuprofen , 1 pink roxy, and 1- 10mg  methodone pill
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
## 2024-07-21 NOTE — BH Assessment (Signed)
 Clinician spoke with IRIS to complete pt's TTS assessment. Clinician provided pt's name, MRN, location, age, room number and provider's name. Secure message completed.    Iris coordinator to update secure chat when assessment time and provider are assigned.  Jackson JONETTA Broach, MS, [facility], [facility] Triage Specialist [phone]

## 2024-07-21 NOTE — ED Notes (Signed)
 Pt dressed out into BH scrubs, personal belongings including overalls, flannel, t shirt, socks, boots, and keys placed into pt belongings bag and labeled with pt specific label. Pt belongings placed into pt locker 2   Pt provided urine sample at this time  Security called to bedside to wand pt, [employer] at bedside

## 2024-07-22 DIAGNOSIS — F319 Bipolar disorder, unspecified: Secondary | ICD-10-CM | POA: Diagnosis not present

## 2024-07-22 MED ORDER — OXYCODONE-ACETAMINOPHEN 5-325 MG PO TABS: 1.0000 | ORAL_TABLET | ORAL | Status: DC | PRN

## 2024-07-22 MED ORDER — RISPERIDONE 1 MG PO TABS
1.0000 mg | ORAL_TABLET | Freq: Every day | ORAL | Status: DC
  Administered 2024-07-22: 1 mg via ORAL
  Filled 2024-07-22: qty 1

## 2024-07-22 MED ORDER — METHADONE HCL 10 MG PO TABS
10.0000 mg | ORAL_TABLET | Freq: Once | ORAL | Status: DC
  Filled 2024-07-22: qty 1

## 2024-07-22 MED ORDER — OXYCODONE HCL 5 MG PO TABS: 5.0000 mg | ORAL_TABLET | ORAL | Status: DC | PRN

## 2024-07-22 MED ORDER — METHADONE HCL 10 MG PO TABS
5.0000 mg | ORAL_TABLET | Freq: Two times a day (BID) | ORAL | Status: DC
  Administered 2024-07-22 – 2024-07-23 (×2): 5 mg via ORAL
  Filled 2024-07-22 (×2): qty 1

## 2024-07-22 NOTE — Progress Notes (Addendum)
 Patient has been denied by [facility] due to no appropriate beds available. Patient meets [facility] inpatient criteria per Dr. Charlene Buba. Patient has been faxed out to the following facilities:   [facility]  [address] [place] [place] [address] [phone]  [facility]  [address] [place] [address] [phone]  [facility]  [address] [place] [address] [phone]  [facility]  [address] [place], [place] [address] [phone]  [facility] Patient Placement  [facility] [place] [phone]  [facility]  [address], [place] [address] [phone]  [facility]  [address] [place] [address] [phone]  [facility] EFAX  [address] [place], [place] [phone]  [facility]  [address] [place] [address] [phone]  [facility]  [address] [place], [place] [address] [phone]  [facility]  [address] [place] [address] [phone]  [facility] [address]  [place] [address] [phone]  [facility] [place] [address] [phone]  [facility]  [address] [place] [address] [phone]  [facility]  [address] [place] [address] [phone]  [facility]  [address] [place] [address] [phone]  [facility]  [address] [place] [address] [phone]  [facility]   [address], [place] [address] [phone]  [facility]  [address] [place] [address] [phone]  [facility]  [address] [place] [address] [phone]  [facility]  [address] [place] [address] [phone]   Bunnie Gallop, MSW, LCSW-A  11:38 AM 07/22/2024

## 2024-07-22 NOTE — ED Notes (Signed)
TTS completed. 
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
## 2024-07-22 NOTE — ED Notes (Signed)
 Nurse asked pt if he would take medication. Pt began yelling and refused.

## 2024-07-23 ENCOUNTER — Other Ambulatory Visit: Payer: Self-pay

## 2024-07-23 ENCOUNTER — Inpatient Hospital Stay (HOSPITAL_COMMUNITY)
Admission: AD | Admit: 2024-07-23 | Discharge: 2024-08-04 | DRG: 885 | Disposition: A | Discharge status: Home / Self Care | Payer: Medicare (Managed Care) | Source: Intra-hospital | Attending: Student in an Organized Health Care Education/Training Program | Admitting: Student in an Organized Health Care Education/Training Program

## 2024-07-23 ENCOUNTER — Encounter (HOSPITAL_COMMUNITY): Payer: Self-pay | Admitting: Psychiatry

## 2024-07-23 DIAGNOSIS — F312 Bipolar disorder, current episode manic severe with psychotic features: Secondary | ICD-10-CM | POA: Diagnosis present

## 2024-07-23 DIAGNOSIS — M549 Dorsalgia, unspecified: Secondary | ICD-10-CM | POA: Diagnosis present

## 2024-07-23 DIAGNOSIS — Z955 Presence of coronary angioplasty implant and graft: Secondary | ICD-10-CM

## 2024-07-23 DIAGNOSIS — Z9049 Acquired absence of other specified parts of digestive tract: Secondary | ICD-10-CM

## 2024-07-23 DIAGNOSIS — I2510 Atherosclerotic heart disease of native coronary artery without angina pectoris: Secondary | ICD-10-CM | POA: Diagnosis present

## 2024-07-23 DIAGNOSIS — I10 Essential (primary) hypertension: Secondary | ICD-10-CM | POA: Diagnosis present

## 2024-07-23 DIAGNOSIS — I252 Old myocardial infarction: Secondary | ICD-10-CM

## 2024-07-23 DIAGNOSIS — K219 Gastro-esophageal reflux disease without esophagitis: Secondary | ICD-10-CM | POA: Diagnosis present

## 2024-07-23 DIAGNOSIS — G8929 Other chronic pain: Secondary | ICD-10-CM | POA: Diagnosis present

## 2024-07-23 DIAGNOSIS — F319 Bipolar disorder, unspecified: Principal | ICD-10-CM | POA: Diagnosis present

## 2024-07-23 DIAGNOSIS — G4730 Sleep apnea, unspecified: Secondary | ICD-10-CM | POA: Diagnosis present

## 2024-07-23 DIAGNOSIS — Z9841 Cataract extraction status, right eye: Secondary | ICD-10-CM | POA: Diagnosis not present

## 2024-07-23 DIAGNOSIS — R410 Disorientation, unspecified: Secondary | ICD-10-CM | POA: Diagnosis present

## 2024-07-23 DIAGNOSIS — Z6833 Body mass index (BMI) 33.0-33.9, adult: Secondary | ICD-10-CM

## 2024-07-23 DIAGNOSIS — Z818 Family history of other mental and behavioral disorders: Secondary | ICD-10-CM | POA: Diagnosis not present

## 2024-07-23 DIAGNOSIS — J449 Chronic obstructive pulmonary disease, unspecified: Secondary | ICD-10-CM | POA: Diagnosis present

## 2024-07-23 DIAGNOSIS — F17210 Nicotine dependence, cigarettes, uncomplicated: Secondary | ICD-10-CM | POA: Diagnosis present

## 2024-07-23 DIAGNOSIS — E669 Obesity, unspecified: Secondary | ICD-10-CM | POA: Diagnosis present

## 2024-07-23 DIAGNOSIS — Z811 Family history of alcohol abuse and dependence: Secondary | ICD-10-CM | POA: Diagnosis not present

## 2024-07-23 DIAGNOSIS — Z91148 Patient's other noncompliance with medication regimen for other reason: Secondary | ICD-10-CM

## 2024-07-23 DIAGNOSIS — Z9682 Presence of neurostimulator: Secondary | ICD-10-CM | POA: Diagnosis not present

## 2024-07-23 DIAGNOSIS — Z5982 Transportation insecurity: Secondary | ICD-10-CM

## 2024-07-23 DIAGNOSIS — F1410 Cocaine abuse, uncomplicated: Secondary | ICD-10-CM | POA: Diagnosis present

## 2024-07-23 DIAGNOSIS — E785 Hyperlipidemia, unspecified: Secondary | ICD-10-CM | POA: Diagnosis present

## 2024-07-23 DIAGNOSIS — Z634 Disappearance and death of family member: Secondary | ICD-10-CM

## 2024-07-23 DIAGNOSIS — E559 Vitamin D deficiency, unspecified: Secondary | ICD-10-CM | POA: Diagnosis present

## 2024-07-23 DIAGNOSIS — Z800 Family history of malignant neoplasm of digestive organs: Secondary | ICD-10-CM

## 2024-07-23 DIAGNOSIS — F411 Generalized anxiety disorder: Secondary | ICD-10-CM | POA: Diagnosis present

## 2024-07-23 MED ORDER — CLONIDINE HCL 0.1 MG PO TABS
0.1000 mg | ORAL_TABLET | Freq: Two times a day (BID) | ORAL | Status: DC | PRN
  Administered 2024-07-24 – 2024-07-25 (×2): 0.1 mg via ORAL
  Filled 2024-07-23 (×2): qty 1

## 2024-07-23 MED ORDER — LORAZEPAM 2 MG/ML IJ SOLN: 2.0000 mg | Freq: Three times a day (TID) | INTRAMUSCULAR | Status: DC | PRN

## 2024-07-23 MED ORDER — MAGNESIUM HYDROXIDE 400 MG/5ML PO SUSP: 30.0000 mL | Freq: Every day | ORAL | Status: DC | PRN

## 2024-07-23 MED ORDER — ALUM & MAG HYDROXIDE-SIMETH 200-200-20 MG/5ML PO SUSP
30.0000 mL | ORAL | Status: DC | PRN
  Administered 2024-07-30 – 2024-08-04 (×8): 30 mL via ORAL
  Filled 2024-07-23 (×3): qty 30

## 2024-07-23 MED ORDER — ACETAMINOPHEN 325 MG PO TABS
650.0000 mg | ORAL_TABLET | Freq: Four times a day (QID) | ORAL | Status: DC | PRN
  Filled 2024-07-23: qty 2

## 2024-07-23 MED ORDER — IBUPROFEN 200 MG PO TABS
200.0000 mg | ORAL_TABLET | Freq: Four times a day (QID) | ORAL | Status: DC | PRN
  Administered 2024-07-23 – 2024-08-03 (×17): 200 mg via ORAL
  Filled 2024-07-23 (×13): qty 1

## 2024-07-23 MED ORDER — DIPHENHYDRAMINE HCL 25 MG PO CAPS: 50.0000 mg | ORAL_CAPSULE | Freq: Three times a day (TID) | ORAL | Status: DC | PRN

## 2024-07-23 MED ORDER — TRAZODONE HCL 50 MG PO TABS
50.0000 mg | ORAL_TABLET | Freq: Every evening | ORAL | Status: DC | PRN
  Administered 2024-07-23 – 2024-08-03 (×10): 50 mg via ORAL
  Filled 2024-07-23 (×6): qty 1

## 2024-07-23 MED ORDER — ZIPRASIDONE MESYLATE 20 MG IM SOLR: 20.0000 mg | Freq: Once | INTRAMUSCULAR | Status: DC

## 2024-07-23 MED ORDER — HALOPERIDOL 5 MG PO TABS: 5.0000 mg | ORAL_TABLET | Freq: Three times a day (TID) | ORAL | Status: DC | PRN

## 2024-07-23 MED ORDER — METOPROLOL TARTRATE 25 MG PO TABS
12.5000 mg | ORAL_TABLET | Freq: Two times a day (BID) | ORAL | Status: DC
  Administered 2024-07-23 – 2024-08-04 (×24): 12.5 mg via ORAL
  Filled 2024-07-23 (×15): qty 1

## 2024-07-23 MED ORDER — DIPHENHYDRAMINE HCL 50 MG/ML IJ SOLN: 50.0000 mg | Freq: Three times a day (TID) | INTRAMUSCULAR | Status: DC | PRN

## 2024-07-23 MED ORDER — AMLODIPINE BESYLATE 5 MG PO TABS
5.0000 mg | ORAL_TABLET | Freq: Every day | ORAL | Status: DC
  Administered 2024-07-23 – 2024-07-28 (×6): 5 mg via ORAL
  Filled 2024-07-23 (×7): qty 1

## 2024-07-23 MED ORDER — HALOPERIDOL LACTATE 5 MG/ML IJ SOLN: 5.0000 mg | Freq: Three times a day (TID) | INTRAMUSCULAR | Status: DC | PRN

## 2024-07-23 MED ORDER — METHADONE HCL 10 MG PO TABS
5.0000 mg | ORAL_TABLET | Freq: Two times a day (BID) | ORAL | Status: DC
  Administered 2024-07-23 – 2024-08-04 (×24): 5 mg via ORAL
  Filled 2024-07-23 (×15): qty 1

## 2024-07-23 MED ORDER — HALOPERIDOL LACTATE 5 MG/ML IJ SOLN: 10.0000 mg | Freq: Three times a day (TID) | INTRAMUSCULAR | Status: DC | PRN

## 2024-07-23 MED ORDER — HYDROXYZINE HCL 25 MG PO TABS
25.0000 mg | ORAL_TABLET | ORAL | Status: AC
  Administered 2024-07-23: 25 mg via ORAL
  Filled 2024-07-23: qty 1

## 2024-07-23 MED ORDER — STERILE WATER FOR INJECTION IJ SOLN
INTRAMUSCULAR | Status: AC
  Filled 2024-07-23: qty 10

## 2024-07-23 NOTE — Tx Team (Signed)
 Initial Treatment Plan 07/23/2024 7:41 PM [name] FMW:[id number]    PATIENT STRESSORS: Health problems   Medication change or noncompliance     PATIENT STRENGTHS: Capable of independent living  Communication skills  Supportive family/friends    PATIENT IDENTIFIED PROBLEMS: Acute psychosis (Paranoia, delusions, aggression to others)    Medication noncompliance I want to work on my medicines.    Multiple comorbidity             DISCHARGE CRITERIA:  Improved stabilization in mood, thinking, and/or behavior Verbal commitment to aftercare and medication compliance  PRELIMINARY DISCHARGE PLAN: Outpatient therapy Return to previous living arrangement  PATIENT/FAMILY INVOLVEMENT: This treatment plan has been presented to and reviewed with the patient, [name] [name]. The patient have been given the opportunity to ask questions and make suggestions.  Dalaina Tates, RN 07/23/2024, 7:41 PM

## 2024-07-23 NOTE — Progress Notes (Signed)
 Pt has been accepted to [facility] on 07/23/2024 Bed assignment: 504-01  Pt meets inpatient criteria per: Charlene Buba MD   Attending Physician will be: Dr. Prentis    Report can be called to: Adult unit: [id number]  Pt can arrive after [facility] WILL UPDATE   Care Team Notified: [facility]  Danika [name], Lyle Servant NT  [place] Sharica Roedel LCSW-A   07/23/2024 12:01 PM

## 2024-07-23 NOTE — Progress Notes (Signed)
 LCSW Progress Note  [id number]   [name] [name]  07/23/2024  9:26 AM  Description:   Inpatient Psychiatric Referral  Patient was recommended inpatient per Julie Haviland (NP). There are no available beds at [facility], per [facility] [name]. Patient was referred to the following out of network facilities:   Destination  Service Provider Address Phone Fax  [facility]  [address] [place]., [place] [address] [phone]  [facility]  [address] [place] [address] [phone]  [facility]  [address] [place] [address] [phone]  [facility]  [address] [place], [place] [address] [phone]  [facility] Patient Placement  [facility] [place] [phone]  [facility]  [address] [place] [address] [phone]  [facility]  [address] [place] [address] [phone]  [facility] EFAX  [address] [place], [place] [phone]  [facility]  [address] [place] [address] [phone]  [facility]  [address] [place], [place] [address] [phone]  [facility]  [address] [place] [address] [phone]  [facility] [address]  [place] [address] [phone]  [facility] [place] [address] [phone]  [facility]  [address] [place] [address] [phone]  [facility]  [address] [place] [address] [phone]  [facility]  [address] [place] [address] [phone]  [facility]  [address] [place] [address] [phone]  [facility]  [address] [place] [address] [phone]  [facility]  [address], [place] [address] [phone]  [facility]  [address], [place] [address] [phone]  [facility]  [address], [place] [address] [phone]      Situation ongoing, CSW to continue following and update chart as more information becomes available.     [name], MSW, LCSW  07/23/2024 9:26 AM

## 2024-07-23 NOTE — ED Notes (Signed)
 Pt out of his room asking nursing staff for his items back so he can leave. Pt told he is IVC'd and can not have his items and he needed to go back to his room. Pt went back to his room with no issues.
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
## 2024-07-23 NOTE — Plan of Care (Signed)
" °  Problem: Activity: Goal: Sleeping patterns will improve Outcome: Progressing   Problem: Safety: Goal: Periods of time without injury will increase Outcome: Progressing   Problem: Activity: Goal: Sleeping patterns will improve Outcome: Progressing   Problem: Education: Goal: Emotional status will improve Outcome: Not Progressing Goal: Mental status will improve Outcome: Not Progressing   Problem: Activity: Goal: Interest or engagement in activities will improve Outcome: Not Progressing   "
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
## 2024-07-23 NOTE — ED Notes (Signed)
 Patient able to de-escalate self at this time and taking a sponge bath

## 2024-07-23 NOTE — Plan of Care (Addendum)
 Pt's BP remains elevated on recheck post Metoprolol  12.5 mg. Provider notified new order received for Norvasc  5 mg PO at 1847 as ordered. Clonidine  0.1 mg PO PRN ordered as well with parameters. Pt awake in dayroom with peers, asymptomatic at this time. Will reassess BP and inform oncoming shift.   Problem: Coping: Goal: Ability to demonstrate self-control will improve Outcome: Progressing   Problem: Safety: Goal: Periods of time without injury will increase Outcome: Progressing
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
## 2024-07-23 NOTE — ED Notes (Signed)
 Pt had another verbal outburst after being informed he is under IVC and what it entails.  Pt was able to de-escalate w/o being given IM medications.  Pt seems to be able to self de-escalate if he he left alone.  Sitter remains at bedside and Security remains at [employer]' station.

## 2024-07-23 NOTE — ED Notes (Addendum)
 All belongings (2 bags) and paperwork given to [facility] Department.  Pt left w/o issue.
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
## 2024-07-23 NOTE — ED Notes (Addendum)
 Pt is currently calm and cooperative.  No issues getting vital signs or taking medications.  Pt allowed this writer to clean up his room w/o issue as well.

## 2024-07-23 NOTE — ED Provider Notes (Addendum)
 Emergency Medicine Observation Re-evaluation Note  [name] [name] is a 58 y.o. male, seen on rounds today.  Pt initially presented to the ED for complaints of Psychiatric Evaluation Currently, the patient is resting comfortably, no acute events overnight.   Physical Exam  BP (!) 166/92   Pulse 92   Temp 98.7 F (37.1 C) (Oral)   Resp 18   Wt 106.6 kg   SpO2 96%   BMI 36.81 kg/m  Physical Exam General: NAD Lungs: No respiratory distress Psych: calm, cooperative   ED Course / MDM  EKG:EKG Interpretation Date/Time:  Saturday July 21 2024 15:18:06 EST Ventricular Rate:  103 PR Interval:  162 QRS Duration:  80 QT Interval:  350 QTC Calculation: 458 R Axis:   63  Text Interpretation: Sinus tachycardia Otherwise normal ECG When compared with ECG of 29-Dec-2022 06:52, Non-specific change in ST segment in Inferior leads ST no longer elevated in Anterior leads T wave inversion now evident in Inferior leads QT has shortened Confirmed by Towana Sharper [phone]) on 07/21/2024 3:20:51 PM  I have reviewed the labs performed to date as well as medications administered while in observation.  Recent changes in the last 24 hours include psychiatry recommended inpatient psychiatry admission   Plan  Current plan is for inpatient psych. Patient accepted to [facility] by Dr. Raliegh. Required one dose of IM geodon  in the ER this morning after he got agitated     [name], DO 07/23/24 0815    [name], DO 07/23/24 1220

## 2024-07-23 NOTE — Group Note (Signed)
 Date:  07/23/2024 Time:  8:57 PM  Group Topic/Focus:  Wrap-Up Group:   The focus of this group is to help patients review their daily goal of treatment and discuss progress on daily workbooks.    Participation Level:  Did Not Attend  Participation Quality:  Resistant  Affect:  Resistant  Cognitive:  Lacking  Insight: None  Engagement in Group:  None  Modes of Intervention:  Discussion  Additional Comments:  Patient did not participate in wrap up group this evening   Bari Moats 07/23/2024, 8:57 PM

## 2024-07-23 NOTE — ED Notes (Signed)
 Pt offered a shower and decline.  Pt requested items for a sponge bath.  Pt provided warm water , towels, soap, comb, deodorant, tooth brush, and tooth paste.

## 2024-07-23 NOTE — ED Notes (Signed)
Security remains at bedside

## 2024-07-24 ENCOUNTER — Encounter (HOSPITAL_COMMUNITY): Payer: Self-pay | Admitting: Psychiatry

## 2024-07-24 DIAGNOSIS — G8929 Other chronic pain: Secondary | ICD-10-CM | POA: Insufficient documentation

## 2024-07-24 DIAGNOSIS — F312 Bipolar disorder, current episode manic severe with psychotic features: Secondary | ICD-10-CM | POA: Diagnosis not present

## 2024-07-24 MED ORDER — NICOTINE 21 MG/24HR TD PT24
21.0000 mg | MEDICATED_PATCH | Freq: Every day | TRANSDERMAL | Status: DC
  Administered 2024-07-24: 21 mg via TRANSDERMAL
  Filled 2024-07-24: qty 1

## 2024-07-24 MED ORDER — BENZTROPINE MESYLATE 0.5 MG PO TABS
0.5000 mg | ORAL_TABLET | Freq: Two times a day (BID) | ORAL | Status: DC
  Administered 2024-07-24 – 2024-07-28 (×8): 0.5 mg via ORAL
  Filled 2024-07-24 (×8): qty 1

## 2024-07-24 MED ORDER — NICOTINE POLACRILEX 2 MG MT GUM
2.0000 mg | CHEWING_GUM | OROMUCOSAL | Status: DC | PRN
  Administered 2024-07-25 – 2024-08-03 (×19): 2 mg via ORAL
  Filled 2024-07-24 (×3): qty 1

## 2024-07-24 MED ORDER — HALOPERIDOL 2 MG PO TABS
2.0000 mg | ORAL_TABLET | Freq: Two times a day (BID) | ORAL | Status: DC
  Administered 2024-07-24 – 2024-07-28 (×8): 2 mg via ORAL
  Filled 2024-07-24 (×8): qty 1

## 2024-07-24 MED ORDER — HYDROXYZINE HCL 25 MG PO TABS
25.0000 mg | ORAL_TABLET | Freq: Three times a day (TID) | ORAL | Status: DC | PRN
  Administered 2024-07-24 – 2024-08-03 (×9): 25 mg via ORAL
  Filled 2024-07-24 (×5): qty 1

## 2024-07-24 MED ORDER — OXYCODONE HCL 5 MG PO TABS
10.0000 mg | ORAL_TABLET | Freq: Two times a day (BID) | ORAL | Status: DC | PRN
  Administered 2024-07-28 – 2024-08-03 (×7): 10 mg via ORAL
  Filled 2024-07-24 (×4): qty 2

## 2024-07-24 MED ORDER — NICOTINE 14 MG/24HR TD PT24
14.0000 mg | MEDICATED_PATCH | Freq: Every day | TRANSDERMAL | Status: DC
  Administered 2024-07-25 – 2024-08-04 (×11): 14 mg via TRANSDERMAL
  Filled 2024-07-24 (×6): qty 1
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
## 2024-07-24 NOTE — Plan of Care (Addendum)
 Pt presents with fair eye contact, pressured, tangential speech. Observed to be disorganized on interactions; demanding d/c to go finish his food he was cooking at home. Remains irritable about being admitted I just want to leave, I don't need to be in here. I haven't seen my son in over a year, why should he put me in here.  Denies SI, HI and AVH. Received PRN Advil  200 mg PO for generalized pain with desired effect when reassessed at 1000. Attended scheduled groups and activities off unit. Pt tolerates meals, fluids and medications well. Safety checks maintained at Q 15 minutes intervals. Pt continue to need frequent verbal redirections throughout this shift. Emotional support, encouragement and reassurance offered.   Problem: Health Behavior/Discharge Planning: Goal: Compliance with treatment plan for underlying cause of condition will improve Outcome: Progressing   Problem: Safety: Goal: Periods of time without injury will increase Outcome: Progressing   Problem: Activity: Goal: Will verbalize the importance of balancing activity with adequate rest periods Outcome: Progressing
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
## 2024-07-24 NOTE — [facility]HH Group Notes (Signed)
 Adult Psychoeducational Group Note  Date:  07/24/2024 Time:  1:03 PM  Group Topic/Focus: Pharmacy Group   Participation Level:  Attended Group   Cherlynn Popiel Lee 07/24/2024, 1:03 PM

## 2024-07-24 NOTE — Progress Notes (Signed)
(  Sleep Hours) -6  (Any PRNs that were needed, meds refused, or side effects to meds)- Vistaril  25 mg , Trazodone  50 mg, Advil  200 mg  (Any disturbances and when (visitation, over night)- none  (Concerns raised by the patient)- pt continues to be delusional and disorganized at times , but pt is pleasant on the unit this evening  (SI/HI/AVH)-denies
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
## 2024-07-24 NOTE — [facility] Group Notes (Signed)
 Adult Psychoeducational Group Note  Date:  07/24/2024 Time:  10:56 AM  Group Topic/FocusRecreation Group:    Participation Level:  Attended group   [name] [name] 07/24/2024, 10:56 AM

## 2024-07-24 NOTE — Progress Notes (Signed)
 Norvasc  0.1mg  PO provided
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
## 2024-07-24 NOTE — Group Note (Signed)
 Recreation Therapy Group Note   Group Topic:Coping Skills  Group Date: 07/24/2024 Start Time: 1045 End Time: 1130 Facilitators: Alexandre Lightsey-McCall, LRT,CTRS Location: [address] Day[address]   Group Topic: Coping Skills   Goal Area(s) Addresses: Patient will define what a coping skill is. Patient will work with peer to create a list of healthy coping skills beginning with each letter of the alphabet. Patient will successfully identify positive coping skills they can use post d/c.  Patient will acknowledge benefit(s) of using learned coping skills post d/c.  Behavioral Response: Engaged   Intervention: Group work   Activity: Coping A to Z. Patient asked to identify what a coping skill is and when they use them. Patients with clinical research associate discussed healthy versus unhealthy coping skills. Next patients were given a blank worksheet titled Coping Skills A-Z. Patients were instructed to come up with at least one positive coping skill per letter of the alphabet, addressing a specific challenge (ex: stress, anger, anxiety, depression, grief, doubt, isolation, self-harm/suicidal thoughts, substance use). Patients were given 15 minutes to brainstorm, before ideas were presented to the large group. Patients and LRT debriefed on the importance of coping skill selection based on situation and back-up plans when a skill tried is not effective. At the end of group, patients were given an handout of alphabetized strategies to keep for future reference.   Education: Pharmacologist, Scientist, Physiological, Discharge Planning.    Education Outcome: Acknowledges education/Verbalizes understanding/In group clarification offered/Additional education needed   Affect/Mood: Appropriate   Participation Level: Engaged   Participation Quality: Independent   Behavior: Appropriate   Speech/Thought Process: Focused   Insight: Good   Judgement: Good   Modes of Intervention: Worksheet   Patient Response to  Interventions:  Engaged   Education Outcome:  In group clarification offered    Clinical Observations/Individualized Feedback: Pt was bright and engaged with peers. Pt was attentive and social as well. Pt had to come up with coping skills for anxiety. Some of the coping skills pt identified were writing songs, poetry, walk in the park, fishing, biking, exercise and watch movies.       Plan: Continue to engage patient in RT group sessions 2-3x/week.   Demorris Choyce-McCall, LRT,CTRS 07/24/2024 1:54 PM

## 2024-07-24 NOTE — [facility] Group Notes (Signed)
 Adult Psychoeducational Group Note  Date:  07/24/2024 Time:  9:54 AM  Group Topic/Focus: Orentation Group Orientation:   The focus of this group is to educate the patient on the purpose and policies of crisis stabilization and provide a format to answer questions about their admission.  The group details unit policies and expectations of patients while admitted.  Participation Level:  Did Not Attend    Naida Rome Ruth 07/24/2024, 9:54 AM

## 2024-07-24 NOTE — Plan of Care (Signed)
   Problem: Activity: Goal: Interest or engagement in activities will improve Outcome: Progressing Goal: Sleeping patterns will improve Outcome: Progressing   Problem: Safety: Goal: Periods of time without injury will increase Outcome: Progressing   Problem: Education: Goal: Emotional status will improve Outcome: Not Progressing Goal: Mental status will improve Outcome: Not Progressing

## 2024-07-25 ENCOUNTER — Encounter (HOSPITAL_COMMUNITY): Payer: Self-pay

## 2024-07-25 DIAGNOSIS — F312 Bipolar disorder, current episode manic severe with psychotic features: Secondary | ICD-10-CM | POA: Diagnosis not present

## 2024-07-25 NOTE — Plan of Care (Signed)

## 2024-07-25 NOTE — Group Note (Signed)
 Date:  07/25/2024 Time:  8:57 PM  Group Topic/Focus:  Wrap-Up Group:   The focus of this group is to help patients review their daily goal of treatment and discuss progress on daily workbooks.    Participation Level:  Active  Participation Quality:  Appropriate  Affect:  Appropriate  Cognitive:  Appropriate  Insight: Appropriate  Engagement in Group:  Developing/Improving  Modes of Intervention:  Discussion  Additional Comments:  Pt stated his goal for today was to focus on his treatment plan. Pt stated he accomplished his goal today. Pt stated he talked with his doctor and social worker about his care today. Pt rated his overall day a 8 out of 10. Pt stated he made no calls today. Pt stated he felt better about himself today. Pt stated he was able to attend all meals. Pt stated he took all medications provided today. Pt stated he attend all groups held today. Pt stated his appetite was pretty good today. Pt rated sleep last night was pretty good. Pt stated the goal tonight was to get some rest. Pt stated he had some physical pain tonight. Pt stated he had some minor pain in both his right and left knees. Pt rated the minor pain in his knees a 3 on the pain level scale. Pt nurse was updated on the situation. Pt deny visual hallucinations and auditory issues tonight. Pt denies thoughts of harming himself or others. Pt stated he would alert staff if anything changed  Lonni Na 07/25/2024, 8:57 PM

## 2024-07-25 NOTE — Progress Notes (Signed)
(  Sleep Hours) -8  (Any PRNs that were needed, meds refused, or side effects to meds)- Vistaril  25 mg , Advil  200 mg , Nicorette  gum , Trazodone  50 mg  (Any disturbances and when (visitation, over night)- none  (Concerns raised by the patient)- none  (SI/HI/AVH)-denies

## 2024-07-25 NOTE — [facility] Suicide Risk Assessment (Signed)
 [facility] INPATIENT:  Family/Significant Other Suicide Prevention Education  Suicide Prevention Education:  Education Completed; Dick Hark (son) [phone], (name of family member/significant other) has been identified by the patient as the family member/significant other with whom the patient will be residing, and identified as the person(s) who will aid the patient in the event of a mental health crisis (suicidal ideations/suicide attempt).  With written consent from the patient, the family member/significant other has been provided the following suicide prevention education, prior to the and/or following the discharge of the patient.  Son said that patient lives alone.  Son may be available to pick him up upon discharge, depending on the day.  Son said that patient doesn't have access to firearms.  Son said that patient isolates himself from everyone.  In the past year they have only spoken once.  Son said that he worries about patient hurting other people, but not himself.  The suicide prevention education provided includes the following: Suicide risk factors Suicide prevention and interventions National Suicide Hotline telephone number [facility] assessment telephone number [facility] Emergency Assistance 911 [facility] and/or Residential Mobile Crisis Unit telephone number  Request made of family/significant other to: Remove weapons (e.g., guns, rifles, knives), all items previously/currently identified as safety concern.   Remove drugs/medications (over-the-counter, prescriptions, illicit drugs), all items previously/currently identified as a safety concern.  The family member/significant other verbalizes understanding of the suicide prevention education information provided.  The family member/significant other agrees to remove the items of safety concern listed above.  Jonan Seufert O Joann Jorge, LCSWA 07/25/2024, 1:39 PM

## 2024-07-25 NOTE — Plan of Care (Signed)
   Problem: Education: Goal: Emotional status will improve Outcome: Progressing Goal: Mental status will improve Outcome: Progressing   Problem: Activity: Goal: Interest or engagement in activities will improve Outcome: Progressing Goal: Sleeping patterns will improve Outcome: Progressing   Problem: Safety: Goal: Periods of time without injury will increase Outcome: Progressing

## 2024-07-25 NOTE — [facility] Group Notes (Signed)
 Adult Psychoeducational Group Note  Date:  07/25/2024 Time:  10:12 AM  Group Topic/Focus:  Goals Group:   The focus of this group is to help patients establish daily goals to achieve during treatment and discuss how the patient can incorporate goal setting into their daily lives to aide in recovery. Orientation:   The focus of this group is to educate the patient on the purpose and policies of crisis stabilization and provide a format to answer questions about their admission.  The group details unit policies and expectations of patients while admitted.  Participation Level:  Did Not Attend [name] [name] 07/25/2024, 10:12 AM

## 2024-07-25 NOTE — [facility] Group Notes (Signed)
 Adult Psychoeducational Group Note  Date:  07/25/2024 Time:  10:41 AM  Group Topic/Focus: Rec. Group  Participation Level:  Did Not Attend Ronnell Puller 07/25/2024, 10:41 AM

## 2024-07-25 NOTE — Group Note (Signed)
 Recreation Therapy Group Note   Group Topic:Problem Solving  Group Date: 07/25/2024 Start Time: 1025 End Time: 1056 Facilitators: Jaramie Bastos-McCall, LRT,CTRS Location: [address] Dayroom   Group Topic: Communication, Team Building, Problem Solving  Goal Area(s) Addresses:  Patient will effectively work with peer towards shared goal.  Patient will identify skills used to make activity successful.  Patient will share challenges and verbalize solution-driven approaches used. Patient will identify how skills used during activity can be used to reach post d/c goals.   Behavioral Response: Observed  Intervention: STEM Activity   Activity: [employer]. Patients were provided the following materials: 5 drinking straws, 5 rubber bands, 5 paper clips, 2 index cards and 2 drinking cups. Using the provided materials patients were asked to build a launching mechanism to launch a ping pong ball across the room, approximately 10 feet. Patients were divided into teams of 3-5. Instructions required all materials be incorporated into the device, functionality of items left to the peer group's discretion.  Education: Pharmacist, Community, Scientist, Physiological, Air Cabin Crew, Building Control Surveyor.   Education Outcome: Acknowledges education/In group clarification offered/Needs additional education.    Affect/Mood: Appropriate   Participation Level: Minimal   Participation Quality: Independent   Behavior: Appropriate   Speech/Thought Process: Relevant   Insight: Fair   Judgement: Fair    Modes of Intervention: STEM Activity   Patient Response to Interventions:  Receptive   Education Outcome:  In group clarification offered    Clinical Observations/Individualized Feedback: Pt came in half way through group after meeting with social worker. Pt did offer some suggestions and assistance to one of his peers during activity.     Plan: Continue to engage patient in RT group sessions  2-3x/week.   [name] [name], LRT,CTRS 07/25/2024 1:36 PM
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
## 2024-07-25 NOTE — Progress Notes (Signed)
" °   07/25/24 1100  Psych Admission Type (Psych Patients Only)  Admission Status Involuntary  Psychosocial Assessment  Patient Complaints Anxiety  Eye Contact Fair  Facial Expression Anxious;Animated  Affect Appropriate to circumstance  Speech Pressured;Tangential  Interaction Defensive  Motor Activity Restless  Appearance/Hygiene Unremarkable  Behavior Characteristics Cooperative  Mood Anxious;Preoccupied  Thought Process  Coherency Circumstantial;Tangential  Content Blaming others;Preoccupation  Delusions Paranoid;Grandeur  Perception Derealization  Hallucination None reported or observed  Judgment Impaired  Confusion Mild  Danger to Self  Current suicidal ideation? Denies  Danger to Others  Danger to Others None reported or observed    "

## 2024-07-25 NOTE — Group Note (Signed)
 Date:  07/25/2024 Time:  3:40 PM  Group Topic/Focus:  Self Care:   The focus of this group is to help patients understand the importance of self-care and sleep hygiene in order to improve or restore emotional, physical, spiritual, interpersonal, and financial health.    Participation Level:  Minimal  Participation Quality: poor  Affect:  Appropriate  Cognitive:  Alert  Insight: Limited  Engagement in Group:  Poor  Modes of Intervention:  Discussion   [name] [name] 07/25/2024, 3:40 PM

## 2024-07-25 NOTE — BH IP Treatment Plan (Signed)
 Interdisciplinary Treatment and Diagnostic Plan Update  07/25/2024 Time of Session: 1025AM [name] [name] MRN: [id number]  Principal Diagnosis: Bipolar 1 disorder, severe, current or most recent episode manic, with psychotic features (HCC)  Secondary Diagnoses: Principal Problem:   Bipolar 1 disorder, severe, current or most recent episode manic, with psychotic features (HCC) Active Problems:   Essential hypertension   Chronic back pain   Current Medications:  Current Facility-Administered Medications  Medication Dose Route Frequency Provider Last Rate Last Admin   alum & mag hydroxide-simeth (MAALOX/MYLANTA) 200-200-20 MG/5ML suspension 30 mL  30 mL Oral Q4H PRN Motley-Mangrum, Jadeka A, PMHNP       amLODipine  (NORVASC ) tablet 5 mg  5 mg Oral Daily Bouchard, Marc A, DO   5 mg at 07/25/24 9148   benztropine  (COGENTIN ) tablet 0.5 mg  0.5 mg Oral BID Bennett, Christal H, NP   0.5 mg at 07/25/24 9148   cloNIDine  (CATAPRES ) tablet 0.1 mg  0.1 mg Oral BID PRN Prentis Kitchens A, DO   0.1 mg at 07/25/24 1012   haloperidol  (HALDOL ) tablet 5 mg  5 mg Oral TID PRN Motley-Mangrum, Jadeka A, PMHNP       And   diphenhydrAMINE  (BENADRYL ) capsule 50 mg  50 mg Oral TID PRN Motley-Mangrum, Jadeka A, PMHNP       haloperidol  lactate (HALDOL ) injection 5 mg  5 mg Intramuscular TID PRN Motley-Mangrum, Jadeka A, PMHNP       And   diphenhydrAMINE  (BENADRYL ) injection 50 mg  50 mg Intramuscular TID PRN Motley-Mangrum, Jadeka A, PMHNP       And   LORazepam  (ATIVAN ) injection 2 mg  2 mg Intramuscular TID PRN Motley-Mangrum, Jadeka A, PMHNP       haloperidol  lactate (HALDOL ) injection 10 mg  10 mg Intramuscular TID PRN Motley-Mangrum, Jadeka A, PMHNP       And   diphenhydrAMINE  (BENADRYL ) injection 50 mg  50 mg Intramuscular TID PRN Motley-Mangrum, Jadeka A, PMHNP       And   LORazepam  (ATIVAN ) injection 2 mg  2 mg Intramuscular TID PRN Motley-Mangrum, Jadeka A, PMHNP       haloperidol  (HALDOL ) tablet 2 mg   2 mg Oral BID Bennett, Christal H, NP   2 mg at 07/25/24 0851   hydrOXYzine  (ATARAX ) tablet 25 mg  25 mg Oral TID PRN Blair, Christal H, NP   25 mg at 07/24/24 2103   ibuprofen  (ADVIL ) tablet 200 mg  200 mg Oral Q6H PRN Onuoha, Chinwendu V, NP   200 mg at 07/25/24 0851   magnesium  hydroxide (MILK OF MAGNESIA) suspension 30 mL  30 mL Oral Daily PRN Motley-Mangrum, Jadeka A, PMHNP       methadone  (DOLOPHINE ) tablet 5 mg  5 mg Oral Q12H Motley-Mangrum, Jadeka A, PMHNP   5 mg at 07/25/24 0851   metoprolol  tartrate (LOPRESSOR ) tablet 12.5 mg  12.5 mg Oral BID Motley-Mangrum, Jadeka A, PMHNP   12.5 mg at 07/25/24 0850   nicotine  (NICODERM CQ  - dosed in mg/24 hours) patch 14 mg  14 mg Transdermal Daily Bennett, Christal H, NP   14 mg at 07/25/24 9148   nicotine  polacrilex (NICORETTE ) gum 2 mg  2 mg Oral PRN Blair, Christal H, NP   2 mg at 07/25/24 1013   oxyCODONE  (Oxy IR/ROXICODONE ) immediate release tablet 10 mg  10 mg Oral BID PRN Bennett, Christal H, NP       traZODone  (DESYREL ) tablet 50 mg  50 mg Oral QHS PRN Trudy Carwin, NP  50 mg at 07/24/24 2103   PTA Medications: Medications Prior to Admission  Medication Sig Dispense Refill Last Dose/Taking   methadone  (DOLOPHINE ) 10 MG tablet Take 10 mg by mouth in the morning, at noon, and at bedtime. Jenelle Groseman- PA      methadone  (DOLOPHINE ) 5 MG tablet Take 5 mg by mouth every 12 (twelve) hours.      naloxone  (NARCAN ) nasal spray 4 mg/0.1 mL SMARTSIG:Spray(s) Both Nares      Oxycodone  HCl 10 MG TABS Take 10 mg by mouth 4 (four) times daily.       Patient Stressors: Health problems   Medication change or noncompliance    Patient Strengths: Capable of independent living  Communication skills  Supportive family/friends   Treatment Modalities: Medication Management, Group therapy, Case management,  1 to 1 session with clinician, Psychoeducation, Recreational therapy.   Physician Treatment Plan for Primary Diagnosis: Bipolar 1 disorder,  severe, current or most recent episode manic, with psychotic features (HCC) Long Term Goal(s): Improvement in symptoms so as ready for discharge   Short Term Goals: Ability to identify changes in lifestyle to reduce recurrence of condition will improve Ability to verbalize feelings will improve Ability to disclose and discuss suicidal ideas Ability to demonstrate self-control will improve Ability to identify and develop effective coping behaviors will improve Ability to maintain clinical measurements within normal limits will improve Compliance with prescribed medications will improve Ability to identify triggers associated with substance abuse/mental health issues will improve  Medication Management: Evaluate patient's response, side effects, and tolerance of medication regimen.  Therapeutic Interventions: 1 to 1 sessions, Unit Group sessions and Medication administration.  Evaluation of Outcomes: Not Progressing  Physician Treatment Plan for Secondary Diagnosis: Principal Problem:   Bipolar 1 disorder, severe, current or most recent episode manic, with psychotic features (HCC) Active Problems:   Essential hypertension   Chronic back pain  Long Term Goal(s): Improvement in symptoms so as ready for discharge   Short Term Goals: Ability to identify changes in lifestyle to reduce recurrence of condition will improve Ability to verbalize feelings will improve Ability to disclose and discuss suicidal ideas Ability to demonstrate self-control will improve Ability to identify and develop effective coping behaviors will improve Ability to maintain clinical measurements within normal limits will improve Compliance with prescribed medications will improve Ability to identify triggers associated with substance abuse/mental health issues will improve     Medication Management: Evaluate patient's response, side effects, and tolerance of medication regimen.  Therapeutic Interventions: 1 to 1  sessions, Unit Group sessions and Medication administration.  Evaluation of Outcomes: Not Progressing   RN Treatment Plan for Primary Diagnosis: Bipolar 1 disorder, severe, current or most recent episode manic, with psychotic features (HCC) Long Term Goal(s): Knowledge of disease and therapeutic regimen to maintain health will improve  Short Term Goals: Ability to remain free from injury will improve, Ability to verbalize frustration and anger appropriately will improve, Ability to demonstrate self-control, Ability to participate in decision making will improve, Ability to verbalize feelings will improve, Ability to disclose and discuss suicidal ideas, Ability to identify and develop effective coping behaviors will improve, and Compliance with prescribed medications will improve  Medication Management: RN will administer medications as ordered by provider, will assess and evaluate patient's response and provide education to patient for prescribed medication. RN will report any adverse and/or side effects to prescribing provider.  Therapeutic Interventions: 1 on 1 counseling sessions, Psychoeducation, Medication administration, Evaluate responses to treatment, Monitor vital signs  and CBGs as ordered, Perform/monitor CIWA, COWS, AIMS and Fall Risk screenings as ordered, Perform wound care treatments as ordered.  Evaluation of Outcomes: Not Progressing   LCSW Treatment Plan for Primary Diagnosis: Bipolar 1 disorder, severe, current or most recent episode manic, with psychotic features (HCC) Long Term Goal(s): Safe transition to appropriate next level of care at discharge, Engage patient in therapeutic group addressing interpersonal concerns.  Short Term Goals: Engage patient in aftercare planning with referrals and resources, Increase social support, Increase ability to appropriately verbalize feelings, Increase emotional regulation, Facilitate acceptance of mental health diagnosis and concerns,  Facilitate patient progression through stages of change regarding substance use diagnoses and concerns, Identify triggers associated with mental health/substance abuse issues, and Increase skills for wellness and recovery  Therapeutic Interventions: Assess for all discharge needs, 1 to 1 time with Social worker, Explore available resources and support systems, Assess for adequacy in community support network, Educate family and significant other(s) on suicide prevention, Complete Psychosocial Assessment, Interpersonal group therapy.  Evaluation of Outcomes: Not Progressing   Progress in Treatment: Attending groups: Yes. Participating in groups: Yes. Taking medication as prescribed: Yes. Toleration medication: Yes. Family/Significant other contact made: No, will contact:  Finnean Cerami (son) [phone] Patient understands diagnosis: No. Discussing patient identified problems/goals with staff: Yes. Medical problems stabilized or resolved: Yes. Denies suicidal/homicidal ideation: Yes. Issues/concerns per patient self-inventory: No.  New problem(s) identified: No, Describe:  none  New Short Term/Long Term Goal(s): medication stabilization, elimination of SI thoughts, development of comprehensive mental wellness plan.    Patient Goals:  Medications and go to group but there is nothing wrong with me  Discharge Plan or Barriers: Patient recently admitted. CSW will continue to follow and assess for appropriate referrals and possible discharge planning.    Reason for Continuation of Hospitalization: Delusions  Mania Medication stabilization  Estimated Length of Stay: 5-7 days  Last 3 [place] Suicide Severity Risk Score: Flowsheet Row Admission (Current) [facility] to Hosp-Admission (Discharged) from 12/28/2022 in [facility] 3[facility] HF PCU ED from 08/24/2022 in [facility] Emergency Department at [facility]  C-SSRS  RISK CATEGORY No Risk No Risk No Risk    Last PHQ 2/9 Scores:    03/11/2022    1:28 PM  Depression screen PHQ 2/9  Decreased Interest 0  Down, Depressed, Hopeless 0  PHQ - 2 Score 0    Scribe for Treatment Team: Jenkins LULLA Primer, LCSWA 07/25/2024 1:24 PM

## 2024-07-26 DIAGNOSIS — F312 Bipolar disorder, current episode manic severe with psychotic features: Secondary | ICD-10-CM | POA: Diagnosis not present

## 2024-07-26 LAB — LIPID PANEL
Cholesterol: 199 mg/dL (ref 0–200)
HDL: 47 mg/dL
LDL Cholesterol: 95 mg/dL (ref 0–99)
Total CHOL/HDL Ratio: 4.2 ratio
Triglycerides: 281 mg/dL — ABNORMAL HIGH
VLDL: 56 mg/dL — ABNORMAL HIGH (ref 0–40)

## 2024-07-26 LAB — HEMOGLOBIN A1C
Hgb A1c MFr Bld: 5.3 % (ref 4.8–5.6)
Mean Plasma Glucose: 105.41 mg/dL

## 2024-07-26 LAB — VITAMIN D 25 HYDROXY (VIT D DEFICIENCY, FRACTURES): Vit D, 25-Hydroxy: 8 ng/mL — ABNORMAL LOW (ref 30–100)

## 2024-07-26 MED ORDER — VITAMIN D (ERGOCALCIFEROL) 1.25 MG (50000 UNIT) PO CAPS
50000.0000 [IU] | ORAL_CAPSULE | ORAL | Status: DC
  Administered 2024-07-26: 50000 [IU] via ORAL
  Filled 2024-07-26: qty 1

## 2024-07-26 NOTE — Plan of Care (Signed)
   Problem: Education: Goal: Emotional status will improve Outcome: Progressing Goal: Mental status will improve Outcome: Progressing   Problem: Activity: Goal: Interest or engagement in activities will improve Outcome: Progressing Goal: Sleeping patterns will improve Outcome: Progressing   Problem: Safety: Goal: Periods of time without injury will increase Outcome: Progressing

## 2024-07-26 NOTE — BHH Group Notes (Signed)
 Adult Psychoeducational Group Note  Date:  07/26/2024 Time:  5:09 PM  Group Topic/Focus: Physical wellness  Participation Level:  Did Not Attend  Participation Quality:    Affect:    Cognitive:    Insight:   Engagement in Group:    Modes of Intervention:    Additional Comments:    Jamelle Cassondra KIDD 07/26/2024, 5:09 PM

## 2024-07-26 NOTE — Group Note (Signed)
 Date:  07/26/2024 Time:  8:41 PM  Group Topic/Focus:  Wrap-Up Group:   The focus of this group is to help patients review their daily goal of treatment and discuss progress on daily workbooks.    Participation Level:  Active  Participation Quality:  Appropriate  Affect:  Appropriate  Cognitive:  Appropriate  Insight: Appropriate  Engagement in Group:  Developing/Improving  Modes of Intervention:  Discussion  Additional Comments:  Pt stated his goal for today was to focus on his treatment plan and discuss a medication issue with his treatment team. Pt stated he accomplished his goals today. Pt stated he talked with his doctor and social worker about his care today. Pt rated his overall day a 10. Pt stated he made no calls today. Pt stated he felt better about himself today. Pt stated he was able to attend all meals. Pt stated he took all medications provided today. Pt stated he attend all groups held today. Pt stated his appetite was pretty good today. Pt rated sleep last night was pretty good. Pt stated the goal tonight was to get some rest. Pt stated he had some physical pain tonight.  Pt stated he had some pain in both his right and left feet. Pt rated his foot pain and 3 on the pain level scale. Pt nurse was updated on the situation. Pt deny visual hallucinations and auditory issues tonight. Pt denies thoughts of harming himself or others. Pt stated he would alert staff if anything changed  Lonni Na 07/26/2024, 8:41 PM

## 2024-07-26 NOTE — BHH Group Notes (Signed)
 Adult Psychoeducational Group Note  Date:  07/26/2024 Time:  9:33 AM  Group Topic/Focus:  Goals Group:   The focus of this group is to help patients establish daily goals to achieve during treatment and discuss how the patient can incorporate goal setting into their daily lives to aide in recovery. Orientation:   The focus of this group is to educate the patient on the purpose and policies of crisis stabilization and provide a format to answer questions about their admission.  The group details unit policies and expectations of patients while admitted.  Participation Level:  Active  Participation Quality:  Attentive  Affect:  Appropriate  Cognitive:  Alert  Insight: Appropriate  Engagement in Group:  Engaged  Modes of Intervention:  Discussion  Additional Comments:  Patient attended and participated in the Goals and Orientation group.  [name] [name] 07/26/2024, 9:33 AM
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
## 2024-07-26 NOTE — Plan of Care (Signed)
   Problem: Activity: Goal: Interest or engagement in activities will improve Outcome: Progressing

## 2024-07-26 NOTE — Progress Notes (Signed)
(  Sleep Hours) -6.5  (Any PRNs that were needed, meds refused, or side effects to meds)- Vistaril  25 mg , Nicorette  gum , Trazodone  50 mg  (Any disturbances and when (visitation, over night)- none  (Concerns raised by the patient)- none  (SI/HI/AVH)-denies

## 2024-07-27 DIAGNOSIS — F312 Bipolar disorder, current episode manic severe with psychotic features: Secondary | ICD-10-CM | POA: Diagnosis not present

## 2024-07-27 MED ORDER — VITAMIN D (ERGOCALCIFEROL) 1.25 MG (50000 UNIT) PO CAPS
50000.0000 [IU] | ORAL_CAPSULE | ORAL | Status: DC
  Administered 2024-08-02: 50000 [IU] via ORAL
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
## 2024-07-27 NOTE — Progress Notes (Signed)
 Spirituality group facilitated by Elia Rockie Sofia, BCC.  Group Description: Group focused on topic of hope. Patients participated in facilitated discussion around topic, connecting with one another around experiences and definitions for hope. Group members engaged with visual explorer photos, reflecting on what hope looks like for them today. Group engaged in discussion around how their definitions of hope are present today in hospital.  Modalities: Psycho-social ed, Adlerian, Narrative, MI  Patient Progress: [name] attended group and actively engaged and participated in group conversation and activities.

## 2024-07-27 NOTE — [facility] Group Notes (Signed)
 Adult Psychoeducational Group Note  Date:  07/27/2024 Time:  10:44 AM  Group Topic/Focus: Recreational Therapy  Participation Level:  Active  Participation Quality:  Appropriate  Affect:  Appropriate  Cognitive:  Appropriate  Insight: Appropriate  Engagement in Group:  Engaged  Modes of Intervention:  Discussion  Additional Comments:  Pt attended recreational therapy.  [name] [name] O 07/27/2024, 10:44 AM

## 2024-07-27 NOTE — [facility] Group Notes (Signed)
 Adult Psychoeducational Group Note  Date:  07/27/2024 Time:  9:14 PM  Group Topic/Focus:  Wrap-Up Group:   The focus of this group is to help patients review their daily goal of treatment and discuss progress on daily workbooks.  Participation Level:  Active  Participation Quality:  Appropriate  Affect:  Appropriate  Cognitive:  Appropriate  Insight: Appropriate  Engagement in Group:  Engaged  Modes of Intervention:  Discussion  Additional Comments:  Pt told that today was an okay day on the unit, the highlight of which were the meals in the cafeteria. On the subject of ways to stay well upon discharge, Pt said he didn't need to do anything because he was just fine and doesn't need to be here. Pt rated his day a 7 out of 10.  Aisha Celestine Ruth 07/27/2024, 9:14 PM

## 2024-07-27 NOTE — Plan of Care (Signed)
" °  Problem: Safety: Goal: Periods of time without injury will increase Outcome: Progressing   Problem: Activity: Goal: Will verbalize the importance of balancing activity with adequate rest periods Outcome: Progressing   Problem: Education: Goal: Will be free of psychotic symptoms Outcome: Progressing Goal: Knowledge of the prescribed therapeutic regimen will improve Outcome: Progressing   "

## 2024-07-27 NOTE — Plan of Care (Signed)
   Problem: Education: Goal: Emotional status will improve Outcome: Progressing Goal: Mental status will improve Outcome: Progressing   Problem: Activity: Goal: Sleeping patterns will improve Outcome: Progressing   Problem: Safety: Goal: Periods of time without injury will increase Outcome: Progressing

## 2024-07-27 NOTE — [facility] Group Notes (Signed)
 Patient attended the Spiritual Wellness group.

## 2024-07-27 NOTE — Group Note (Signed)
 Recreation Therapy Group Note   Group Topic:Problem Solving  Group Date: 07/27/2024 Start Time: 1025 End Time: 1055 Facilitators: Delwin Raczkowski-McCall, LRT,CTRS Location: [address] Dayroom   Group Topic: Problem Solving  Goal Area(s) Addresses:  Patient will verbalize importance of using appropriate problem solving techniques.  Patient will identify positive change associated with effective problem solving skills.   Behavioral Response: Active  Intervention: Worksheets, Music  Activity: Dentist. Patients were given a front and back worksheet with 15 brain teasers on each side. Patients could work together to medical laboratory scientific officer presented. Patients were given 20 minutes to complete the brain teasers before LRT and patients went over the answers.    Education: Decision Making/Discharge Planning  Education Outcome: Acknowledges understanding/In group clarification offered/Needs additional education.    Affect/Mood: Appropriate   Participation Level: Active   Participation Quality: Independent   Behavior: Appropriate   Speech/Thought Process: Focused   Insight: Good   Judgement: Good   Modes of Intervention: Music and Worksheet   Patient Response to Interventions:  Engaged   Education Outcome:  In group clarification offered    Clinical Observations/Individualized Feedback: Pt was quiet but focused during group. Pt was active throughout group session.      Plan: Continue to engage patient in RT group sessions 2-3x/week.   Laurelin Elson-McCall, LRT,CTRS 07/27/2024 12:49 PM

## 2024-07-27 NOTE — Progress Notes (Signed)
(  Sleep Hours) -5.5  (Any PRNs that were needed, meds refused, or side effects to meds)- Vistaril  25 mg , Advil  200 mg , Trazodone  50 mg  (Any disturbances and when (visitation, over night)-none  (Concerns raised by the patient)- none  (SI/HI/AVH)-denies

## 2024-07-27 NOTE — [facility] Group Notes (Signed)
 Adult Psychoeducational Group Note  Date:  07/27/2024 Time:  9:25 AM  Group Topic/Focus:  Goals Group:   The focus of this group is to help patients establish daily goals to achieve during treatment and discuss how the patient can incorporate goal setting into their daily lives to aide in recovery. Orientation:   The focus of this group is to educate the patient on the purpose and policies of crisis stabilization and provide a format to answer questions about their admission.  The group details unit policies and expectations of patients while admitted.  Participation Level:  Active  Participation Quality:  Attentive  Affect:  Appropriate  Cognitive:  Alert  Insight: Appropriate  Engagement in Group:  Engaged  Modes of Intervention:  Discussion  Additional Comments:  Patient attended and participated in the Goals and Orientation group.  [name] [name] 07/27/2024, 9:25 AM

## 2024-07-27 NOTE — [facility] Group Notes (Signed)
 Adult Psychoeducational Group Note  Date:  07/27/2024 Time:  5:11 PM  Group Topic/Focus: Physical Wellness   Participation Level:  Active  Participation Quality:  Appropriate  Affect:  Appropriate  Cognitive:  Alert  Insight: Appropriate  Engagement in Group:  Engaged  Modes of Intervention:  Discussion  Additional Comments:  Patient attended and participated in the Physical Wellness group activity.  [name] [name] 07/27/2024, 5:11 PM

## 2024-07-28 DIAGNOSIS — F312 Bipolar disorder, current episode manic severe with psychotic features: Secondary | ICD-10-CM | POA: Diagnosis not present

## 2024-07-28 MED ORDER — HALOPERIDOL 5 MG PO TABS
5.0000 mg | ORAL_TABLET | Freq: Two times a day (BID) | ORAL | Status: DC
  Administered 2024-07-28 – 2024-07-30 (×4): 5 mg via ORAL
  Filled 2024-07-28 (×4): qty 1

## 2024-07-28 MED ORDER — AMLODIPINE BESYLATE 5 MG PO TABS
7.5000 mg | ORAL_TABLET | Freq: Every day | ORAL | Status: DC
  Administered 2024-07-29 – 2024-08-04 (×7): 7.5 mg via ORAL
  Filled 2024-07-28 (×2): qty 2

## 2024-07-28 MED ORDER — BENZTROPINE MESYLATE 1 MG PO TABS
1.0000 mg | ORAL_TABLET | Freq: Two times a day (BID) | ORAL | Status: DC
  Administered 2024-07-28 – 2024-08-04 (×14): 1 mg via ORAL
  Filled 2024-07-28 (×5): qty 1

## 2024-07-28 NOTE — Progress Notes (Signed)
 Pt pleasant and cooperative, denies SI, plan or intent, c/o pain.    07/28/24 2300  Psych Admission Type (Psych Patients Only)  Admission Status Involuntary  Psychosocial Assessment  Patient Complaints Anxiety  Eye Contact Fair  Facial Expression Flat;Sad  Affect Sad  Speech Logical/coherent  Interaction Assertive  Motor Activity Slow  Appearance/Hygiene In scrubs  Behavior Characteristics Cooperative  Mood Pleasant;Sullen  Thought Process  Coherency WDL  Content WDL  Delusions None reported or observed  Perception WDL  Hallucination None reported or observed  Judgment Impaired  Confusion None  Danger to Self  Current suicidal ideation? Denies  Danger to Others  Danger to Others None reported or observed

## 2024-07-28 NOTE — Progress Notes (Signed)
 Patient's Haldol  is increased from 2 mg bid to 5 mg bid. The cogentin  is also increased from 0.5 mg bid to 1 mg po bid. The reason for the increment of the haldol /cogentin  doses is because of the collateral information obtained & provided by patient's son. See collateral information. OT consult order in place for evaluation of patient's ability to care for himself. This will determine the focus of his discharge plan.

## 2024-07-28 NOTE — [facility] Group Notes (Signed)
 Adult Psychoeducational Group Note  Date:  07/28/2024 Time:  2:02 PM  Group Topic/Focus:  Goals Group:   The focus of this group is to help patients establish daily goals to achieve during treatment and discuss how the patient can incorporate goal setting into their daily lives to aide in recovery. Orientation:   The focus of this group is to educate the patient on the purpose and policies of crisis stabilization and provide a format to answer questions about their admission.  The group details unit policies and expectations of patients while admitted.  Participation Level:  Active  Participation Quality:  Appropriate  Affect:  Appropriate  Cognitive:  Appropriate  Insight: Appropriate  Engagement in Group:  Engaged  Modes of Intervention:  Discussion  Additional Comments:  Pt attended the goals group and remained appropriate and engaged throughout the duration of the group.   Takeysha Bonk O 07/28/2024, 2:02 PM

## 2024-07-28 NOTE — [facility] Group Notes (Signed)
 Adult Psychoeducational Group Note  Date:  07/28/2024 Time:  2:03 PM  Group Topic/Focus: Social Work  Participation Level:  Active  Participation Quality:  Appropriate  Affect:  Appropriate  Cognitive:  Appropriate  Insight: Appropriate  Engagement in Group:  Engaged  Modes of Intervention:  Discussion  Additional Comments:  Pt attended the social work group.  Kalayla Shadden O 07/28/2024, 2:03 PM
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
## 2024-07-28 NOTE — Group Note (Signed)
 Date:  07/28/2024 Time:  9:07 PM  Group Topic/Focus:  Wrap-Up Group:   The focus of this group is to help patients review their daily goal of treatment and discuss progress on daily workbooks.    Participation Level:  Active  Participation Quality:  Appropriate  Affect:  Appropriate  Cognitive:  Appropriate  Insight: Appropriate  Engagement in Group:  Engaged  Modes of Intervention:  Education  Additional Comments:  Patient attended and participated in group tonight. He reports that today he went for his meals, took a couple naps and talk to some people,  [name] [name] 07/28/2024, 9:07 PM

## 2024-07-28 NOTE — [facility] Group Notes (Signed)
 Adult Psychoeducational Group Note  Date:  07/28/2024 Time:  3:42 PM  Group Topic/Focus: Emotional Wellness  Participation Level:  Did Not Attend  Participation Quality:    Affect:    Cognitive:    Insight:   Engagement in Group:    Modes of Intervention:    Additional Comments:    [name] [name] O 07/28/2024, 3:42 PM

## 2024-07-28 NOTE — [facility] Group Notes (Signed)
 Adult Psychoeducational Group Note  Date:  07/28/2024 Time:  3:41 PM  Group Topic/Focus: Physical Wellness  Participation Level:  Active  Participation Quality:  Appropriate  Affect:  Appropriate  Cognitive:  Appropriate  Insight: Appropriate  Engagement in Group:  Engaged  Modes of Intervention:  Discussion  Additional Comments:    [name] [name] 07/28/2024, 3:41 PM

## 2024-07-28 NOTE — Group Note (Unsigned)
 Date:  07/28/2024 Time:  8:46 PM  Group Topic/Focus:  Wrap-Up Group:   The focus of this group is to help patients review their daily goal of treatment and discuss progress on daily workbooks.     Participation Level:  {BHH PARTICIPATION OZCZO:77735}  Participation Quality:  {BHH PARTICIPATION QUALITY:22265}  Affect:  {BHH AFFECT:22266}  Cognitive:  {BHH COGNITIVE:22267}  Insight: {BHH Insight2:20797}  Engagement in Group:  {BHH ENGAGEMENT IN HMNLE:77731}  Modes of Intervention:  {BHH MODES OF INTERVENTION:22269}  Additional Comments:  ***  Gwenn Nobie [place] 07/28/2024, 8:46 PM

## 2024-07-28 NOTE — Group Note (Signed)
" °  [facility] LCSW Group Therapy Note  Date/Time:  07/28/2024 11:15AM-12:00PM  Type of Therapy and Topic:  Group Therapy:  Feelings About Hospitalization  Participation Level:  Active   Description of Group This process group involved patients discussing their feelings related to being hospitalized, as well as the benefits they see to being in the hospital.  These feelings and benefits were itemized.  The group then brainstormed specific ways in which they could seek those same benefits when they discharge and return home.  Therapeutic Goals Patient will identify and describe positive and negative feelings related to hospitalization Patient will verbalize benefits of hospitalization to themselves personally Patients will brainstorm together ways they can obtain similar benefits in the outpatient setting, identify barriers to wellness and possible solutions  Summary of Patient Progress:  The patient expressed his primary feelings about being hospitalized are anger because he does not want or need the medicines.  He would not respond when asked if he has seen an improvement in his symptoms since starting the medicine.  He remained anger and frustrated about the same topic throughout group.  When asked how he can take his hospital experience to make things better at home, he responded that he will not let anybody in the house.  He was offended by the suggestion that this might mean he would isolate himself, corrected CSW.  He stated that the one thing he is going to do to improve his life at discharge is to start a new family.  He indicated he will not stay on the medication, then would put his hands up in a surrender signal and say I'll take what is on the paper.  Therapeutic Modalities Cognitive Behavioral Therapy Motivational Interviewing    Elgie Crest, LCSW 07/28/2024, 1:52 PM    "

## 2024-07-28 NOTE — Plan of Care (Signed)
" °  Problem: Education: Goal: Knowledge of [place] [facility] Education information/materials will improve Outcome: Progressing Goal: Emotional status will improve Outcome: Progressing Goal: Mental status will improve Outcome: Progressing   Problem: Activity: Goal: Interest or engagement in activities will improve Outcome: Progressing   Problem: Coping: Goal: Ability to verbalize frustrations and anger appropriately will improve Outcome: Progressing   Problem: Safety: Goal: Periods of time without injury will increase Outcome: Progressing   Problem: Education: Goal: Will be free of psychotic symptoms Outcome: Progressing   "

## 2024-07-29 DIAGNOSIS — F312 Bipolar disorder, current episode manic severe with psychotic features: Secondary | ICD-10-CM | POA: Diagnosis not present

## 2024-07-29 MED ORDER — WHITE PETROLATUM EX OINT
TOPICAL_OINTMENT | CUTANEOUS | Status: AC
  Filled 2024-07-29: qty 5

## 2024-07-29 NOTE — Group Note (Signed)
 Date:  07/29/2024 Time:  10:19 AM  Group Topic/Focus:  Emotional Education:   The focus of this group is to discuss what feelings/emotions are, and how they are experienced. Wellness Toolbox:   The focus of this group is to discuss various aspects of wellness, balancing those aspects and exploring ways to increase the ability to experience wellness.  Patients will create a wellness toolbox for use upon discharge.    Participation Level:  Did Not Attend   [name] [name] [name] 07/29/2024, 10:19 AM

## 2024-07-29 NOTE — Plan of Care (Signed)
 Pt was out in the milieu during the day acting appropriately. Went to [employer] and ate adequately. Attending group activity and participated. Denies SI/HI/SH/paranoia/AVH. Will continue to monitor.

## 2024-07-29 NOTE — Group Note (Signed)
 Date:  07/29/2024 Time:  9:37 AM  Group Topic/Focus:  Goals Group:   The focus of this group is to help patients establish daily goals to achieve during treatment and discuss how the patient can incorporate goal setting into their daily lives to aide in recovery. Orientation:   The focus of this group is to educate the patient on the purpose and policies of crisis stabilization and provide a format to answer questions about their admission.  The group details unit policies and expectations of patients while admitted.    Participation Level:  None  Participation Quality:  Resistant  Affect:  Defensive  Cognitive:  Confused  Insight: Lacking  Engagement in Group:  Lacking  Modes of Intervention:  Discussion  Additional Comments:  Pt did not have a goal.   Logan LITTIE [name] 07/29/2024, 9:37 AM
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
## 2024-07-29 NOTE — Group Note (Signed)
 Date:  07/29/2024 Time:  6:11 PM  Group Topic/Focus:  Self Care:   The focus of this group is to help patients understand the importance of self-care in order to improve or restore emotional, physical, spiritual, interpersonal, and financial health.    Participation Level:  Active  Wayland Baik LITTIE [name] 07/29/2024, 6:11 PM

## 2024-07-29 NOTE — Group Note (Signed)
 Date:  07/29/2024 Time:  8:55 PM  Group Topic/Focus:  Wrap-Up Group:   The focus of this group is to help patients review their daily goal of treatment and discuss progress on daily workbooks.    Participation Level:  Active  Participation Quality:  Appropriate  Affect:  Appropriate  Cognitive:  Appropriate  Insight: Appropriate  Engagement in Group:  Engaged  Modes of Intervention:  Education  Additional Comments:  Patient attended and participated in group tonight. He reports that his goal was to make it through the day and he did by eating.  Gwenn Chillington Dacosta 07/29/2024, 8:55 PM

## 2024-07-29 NOTE — Group Note (Signed)
 Date:  07/29/2024 Time:  3:47 PM  Group Topic/Focus:  Self Care:   The focus of this group is to help patients understand the importance of self-care and sleep hygiene in order to improve or restore emotional, physical, spiritual, interpersonal, and financial health.    Participation Level:  Active  Participation Quality:  Attentive and Sharing  Affect:  Depressed  Cognitive:  Alert  Insight: Improving  Engagement in Group:  Developing/Improving and Engaged  Modes of Intervention:  Discussion   [name] [name] 07/29/2024, 3:47 PM

## 2024-07-30 ENCOUNTER — Encounter (HOSPITAL_COMMUNITY): Payer: Self-pay

## 2024-07-30 DIAGNOSIS — F312 Bipolar disorder, current episode manic severe with psychotic features: Secondary | ICD-10-CM | POA: Diagnosis not present

## 2024-07-30 MED ORDER — HALOPERIDOL 5 MG PO TABS
5.0000 mg | ORAL_TABLET | Freq: Two times a day (BID) | ORAL | Status: AC
  Administered 2024-07-30: 5 mg via ORAL
  Filled 2024-07-30: qty 1

## 2024-07-30 MED ORDER — HALOPERIDOL 5 MG PO TABS
10.0000 mg | ORAL_TABLET | Freq: Every day | ORAL | Status: DC
  Administered 2024-07-31 – 2024-08-03 (×4): 10 mg via ORAL
  Filled 2024-07-30 (×4): qty 2

## 2024-07-30 NOTE — Group Note (Signed)
 Date:  07/30/2024 Time:  9:30 AM  Group Topic/Focus:  Goals Group:   The focus of this group is to help patients establish daily goals to achieve during treatment and discuss how the patient can incorporate goal setting into their daily lives to aide in recovery. Orientation:   The focus of this group is to educate the patient on the purpose and policies of crisis stabilization and provide a format to answer questions about their admission.  The group details unit policies and expectations of patients while admitted.    Participation Level:  Active  Participation Quality:  Appropriate  Affect:  Appropriate  Cognitive:  Appropriate  Insight: Appropriate  Engagement in Group:  Improving  Modes of Intervention:  Discussion  Additional Comments:  Pt shared his goals with the group.   Nessie Nong LITTIE [name] 07/30/2024, 9:30 AM

## 2024-07-30 NOTE — Group Note (Signed)
 LCSW Group Therapy Note   Group Date: 07/30/2024 Start Time: 1100 End Time: 1200   Participation:  patient was present.  He listened and was respectful but didn't participate in the discussion.  Type of Therapy:  Group Therapy  Topic:  Healing Flames: Navigating Anger with Compassion  Title: Healing Flames: Navigating Anger with Compassion Objective:  Foster self-awareness and promote compassion toward oneself and others when dealing with anger.  Goals:  Help participants understand the underlying emotions and needs fueling anger. Provide coping strategies for healthier emotional expression and anger management.  Summary: This session explored anger as a volcano--an explosion driven by deeper feelings and unmet needs. Participants learned to identify anger triggers and underlying emotions, then practiced coping strategies like deep breathing, physical activity, and journaling. The group discussed healthy ways to manage anger before it escalates, using both personal reflection and shared experiences.  Therapeutic Modalities: Cognitive Behavioral Therapy (CBT): Challenging thoughts that fuel anger. Mindfulness: Increasing awareness of emotions and sensations.   Detra Bores O Konstantin Lehnen, LCSWA 07/30/2024  2:32 PM

## 2024-07-30 NOTE — Group Note (Signed)
 Date:  07/30/2024 Time:  10:31 AM  Group Topic/Focus:  Recreational Therapy    Participation Level:  Active   [name] [name] LITTIE [name] 07/30/2024, 10:31 AM

## 2024-07-30 NOTE — [facility] Group Notes (Signed)
 Adult Psychoeducational Group Note  Date:  07/30/2024 Time:  6:29 PM  Group Topic/Focus:  Wellness Toolbox:   The focus of this group is to discuss various aspects of wellness, balancing those aspects and exploring ways to increase the ability to experience wellness.  Patients will create a wellness toolbox for use upon discharge.  Participation Level:  Active  Participation Quality:  Attentive  Affect:  Appropriate  Cognitive:  Alert  Insight: Appropriate  Engagement in Group:  Engaged  Modes of Intervention:  Activity  Additional Comments:  Patient attended and participated in the Mental Health Video group activity.  [name] [name] 07/30/2024, 6:29 PM
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
## 2024-07-30 NOTE — [facility] Group Notes (Signed)
 Patient attended the Social Work group.

## 2024-07-30 NOTE — Group Note (Signed)
 Recreation Therapy Group Note   Group Topic:Health and Wellness  Group Date: 07/30/2024 Start Time: 1005 End Time: 1022 Facilitators: Cataleya Cristina-McCall, LRT,CTRS Location: [address] Dayroom   Group Topic: Exercise/Wellness  Goal Area(s) Addresses:  Patient will verbalize benefit of exercise during group session. Patient will identify an exercise that can be completed post d/c. Patient will acknowledge benefits of exercise when used as a coping mechanism.   Behavioral Response: Observant  Intervention: Music  Activity: LRT and patients were to complete 3 rounds of exercises. Each patient took turns leading the group in the exercises of their choosing. Patients could take breaks or get water  if needed.  Education: Physical Activity, Health and Wellness  Education Outcome: Acknowledges understanding/In group clarification offered/Needs additional education.    Affect/Mood: Flat   Participation Level: None   Participation Quality: None   Behavior: On-looking   Speech/Thought Process: None   Insight: None   Judgement: None   Modes of Intervention: Music   Patient Response to Interventions:  Attentive   Education Outcome:  In group clarification offered    Clinical Observations/Individualized Feedback: Pt was quiet and observant of peers. Pt stated he couldn't participate due to his disability.    Plan: Continue to engage patient in RT group sessions 2-3x/week.   Aragon Scarantino-McCall, LRT,CTRS 07/30/2024 1:36 PM

## 2024-07-30 NOTE — Plan of Care (Signed)
   Problem: Education: Goal: Emotional status will improve Outcome: Progressing Goal: Mental status will improve Outcome: Progressing   Problem: Activity: Goal: Interest or engagement in activities will improve Outcome: Progressing Goal: Sleeping patterns will improve Outcome: Progressing

## 2024-07-30 NOTE — Plan of Care (Signed)
 Pt was out in the milieu during the day acting appropriately. Went to [employer] and ate adequately. Attending group activity and participated. Denies SI/HI/SH/paranoia/AVH. Will continue to monitor.
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
## 2024-07-30 NOTE — Plan of Care (Signed)
  Problem: Activity: Goal: Sleeping patterns will improve Outcome: Progressing   

## 2024-07-30 NOTE — [facility] Group Notes (Signed)
 Adult Psychoeducational Group Note  Date:  07/30/2024 Time:  8:04 PM  Group Topic/Focus:  Wrap-Up Group:   The focus of this group is to help patients review their daily goal of treatment and discuss progress on daily workbooks.  Participation Level:  Active  Participation Quality:  Attentive  Affect:  Appropriate  Cognitive:  Alert  Insight: Appropriate  Engagement in Group:  Engaged  Modes of Intervention:  Discussion  Additional Comments:  Patient attended and participated in the Wrap-up group.  [name] [name] 07/30/2024, 8:04 PM

## 2024-07-30 NOTE — BH IP Treatment Plan (Signed)
 Interdisciplinary Treatment and Diagnostic Plan Update  07/30/2024 Time of Session: 0855 [name] [name] MRN: [id number]  Principal Diagnosis: Bipolar 1 disorder, severe, current or most recent episode manic, with psychotic features (HCC)  Secondary Diagnoses: Principal Problem:   Bipolar 1 disorder, severe, current or most recent episode manic, with psychotic features (HCC) Active Problems:   Essential hypertension   Chronic back pain   Current Medications:  Current Facility-Administered Medications  Medication Dose Route Frequency Provider Last Rate Last Admin   alum & mag hydroxide-simeth (MAALOX/MYLANTA) 200-200-20 MG/5ML suspension 30 mL  30 mL Oral Q4H PRN Motley-Mangrum, Jadeka A, PMHNP       amLODipine  (NORVASC ) tablet 7.5 mg  7.5 mg Oral Daily Nwoko, Mac I, NP   7.5 mg at 07/29/24 9176   benztropine  (COGENTIN ) tablet 1 mg  1 mg Oral BID Nwoko, Agnes I, NP   1 mg at 07/29/24 1833   cloNIDine  (CATAPRES ) tablet 0.1 mg  0.1 mg Oral BID PRN Prentis Kitchens A, DO   0.1 mg at 07/25/24 1012   haloperidol  (HALDOL ) tablet 5 mg  5 mg Oral TID PRN Motley-Mangrum, Jadeka A, PMHNP       And   diphenhydrAMINE  (BENADRYL ) capsule 50 mg  50 mg Oral TID PRN Motley-Mangrum, Jadeka A, PMHNP       haloperidol  lactate (HALDOL ) injection 5 mg  5 mg Intramuscular TID PRN Motley-Mangrum, Jadeka A, PMHNP       And   diphenhydrAMINE  (BENADRYL ) injection 50 mg  50 mg Intramuscular TID PRN Motley-Mangrum, Jadeka A, PMHNP       And   LORazepam  (ATIVAN ) injection 2 mg  2 mg Intramuscular TID PRN Motley-Mangrum, Jadeka A, PMHNP       haloperidol  lactate (HALDOL ) injection 10 mg  10 mg Intramuscular TID PRN Motley-Mangrum, Jadeka A, PMHNP       And   diphenhydrAMINE  (BENADRYL ) injection 50 mg  50 mg Intramuscular TID PRN Motley-Mangrum, Jadeka A, PMHNP       And   LORazepam  (ATIVAN ) injection 2 mg  2 mg Intramuscular TID PRN Motley-Mangrum, Jadeka A, PMHNP       haloperidol  (HALDOL ) tablet 5 mg  5 mg Oral  BID Nwoko, Agnes I, NP   5 mg at 07/29/24 2049   hydrOXYzine  (ATARAX ) tablet 25 mg  25 mg Oral TID PRN Blair, Christal H, NP   25 mg at 07/27/24 2041   ibuprofen  (ADVIL ) tablet 200 mg  200 mg Oral Q6H PRN Onuoha, Chinwendu V, NP   200 mg at 07/29/24 2051   magnesium  hydroxide (MILK OF MAGNESIA) suspension 30 mL  30 mL Oral Daily PRN Motley-Mangrum, Jadeka A, PMHNP       methadone  (DOLOPHINE ) tablet 5 mg  5 mg Oral Q12H Motley-Mangrum, Jadeka A, PMHNP   5 mg at 07/29/24 2049   metoprolol  tartrate (LOPRESSOR ) tablet 12.5 mg  12.5 mg Oral BID Motley-Mangrum, Jadeka A, PMHNP   12.5 mg at 07/29/24 1833   nicotine  (NICODERM CQ  - dosed in mg/24 hours) patch 14 mg  14 mg Transdermal Daily Bennett, Christal H, NP   14 mg at 07/29/24 9175   nicotine  polacrilex (NICORETTE ) gum 2 mg  2 mg Oral PRN Blair, Christal H, NP   2 mg at 07/29/24 1834   oxyCODONE  (Oxy IR/ROXICODONE ) immediate release tablet 10 mg  10 mg Oral BID PRN Blair, Christal H, NP   10 mg at 07/29/24 2051   traZODone  (DESYREL ) tablet 50 mg  50 mg Oral QHS PRN  Trudy Carwin, NP   50 mg at 07/27/24 2041   [START ON 08/02/2024] Vitamin D  (Ergocalciferol ) (DRISDOL ) 1.25 MG (50000 UNIT) capsule 50,000 Units  50,000 Units Oral Q7 days Ntuen, Tina C, FNP       PTA Medications: Medications Prior to Admission  Medication Sig Dispense Refill Last Dose/Taking   methadone  (DOLOPHINE ) 10 MG tablet Take 10 mg by mouth in the morning, at noon, and at bedtime. Jenelle Groseman- PA      methadone  (DOLOPHINE ) 5 MG tablet Take 5 mg by mouth every 12 (twelve) hours.      naloxone  (NARCAN ) nasal spray 4 mg/0.1 mL SMARTSIG:Spray(s) Both Nares      Oxycodone  HCl 10 MG TABS Take 10 mg by mouth 4 (four) times daily.       Patient Stressors: Health problems   Medication change or noncompliance    Patient Strengths: Capable of independent living  Communication skills  Supportive family/friends   Treatment Modalities: Medication Management, Group therapy,  Case management,  1 to 1 session with clinician, Psychoeducation, Recreational therapy.   Physician Treatment Plan for Primary Diagnosis: Bipolar 1 disorder, severe, current or most recent episode manic, with psychotic features (HCC) Long Term Goal(s): Improvement in symptoms so as ready for discharge   Short Term Goals: Ability to identify changes in lifestyle to reduce recurrence of condition will improve Ability to verbalize feelings will improve Ability to disclose and discuss suicidal ideas Ability to demonstrate self-control will improve Ability to identify and develop effective coping behaviors will improve Ability to maintain clinical measurements within normal limits will improve Compliance with prescribed medications will improve Ability to identify triggers associated with substance abuse/mental health issues will improve  Medication Management: Evaluate patient's response, side effects, and tolerance of medication regimen.  Therapeutic Interventions: 1 to 1 sessions, Unit Group sessions and Medication administration.  Evaluation of Outcomes: Progressing  Physician Treatment Plan for Secondary Diagnosis: Principal Problem:   Bipolar 1 disorder, severe, current or most recent episode manic, with psychotic features (HCC) Active Problems:   Essential hypertension   Chronic back pain  Long Term Goal(s): Improvement in symptoms so as ready for discharge   Short Term Goals: Ability to identify changes in lifestyle to reduce recurrence of condition will improve Ability to verbalize feelings will improve Ability to disclose and discuss suicidal ideas Ability to demonstrate self-control will improve Ability to identify and develop effective coping behaviors will improve Ability to maintain clinical measurements within normal limits will improve Compliance with prescribed medications will improve Ability to identify triggers associated with substance abuse/mental health issues  will improve     Medication Management: Evaluate patient's response, side effects, and tolerance of medication regimen.  Therapeutic Interventions: 1 to 1 sessions, Unit Group sessions and Medication administration.  Evaluation of Outcomes: Progressing   RN Treatment Plan for Primary Diagnosis: Bipolar 1 disorder, severe, current or most recent episode manic, with psychotic features (HCC) Long Term Goal(s): Knowledge of disease and therapeutic regimen to maintain health will improve  Short Term Goals: Ability to remain free from injury will improve, Ability to verbalize frustration and anger appropriately will improve, Ability to participate in decision making will improve, Ability to disclose and discuss suicidal ideas, and Ability to identify and develop effective coping behaviors will improve  Medication Management: RN will administer medications as ordered by provider, will assess and evaluate patient's response and provide education to patient for prescribed medication. RN will report any adverse and/or side effects to prescribing provider.  Therapeutic Interventions: 1 on 1 counseling sessions, Psychoeducation, Medication administration, Evaluate responses to treatment, Monitor vital signs and CBGs as ordered, Perform/monitor CIWA, COWS, AIMS and Fall Risk screenings as ordered, Perform wound care treatments as ordered.  Evaluation of Outcomes: Progressing   LCSW Treatment Plan for Primary Diagnosis: Bipolar 1 disorder, severe, current or most recent episode manic, with psychotic features (HCC) Long Term Goal(s): Safe transition to appropriate next level of care at discharge, Engage patient in therapeutic group addressing interpersonal concerns.  Short Term Goals: Engage patient in aftercare planning with referrals and resources, Increase ability to appropriately verbalize feelings, Increase emotional regulation, Facilitate acceptance of mental health diagnosis and concerns, and  Facilitate patient progression through stages of change regarding substance use diagnoses and concerns  Therapeutic Interventions: Assess for all discharge needs, 1 to 1 time with Social worker, Explore available resources and support systems, Assess for adequacy in community support network, Educate family and significant other(s) on suicide prevention, Complete Psychosocial Assessment, Interpersonal group therapy.  Evaluation of Outcomes: Progressing   Progress in Treatment: Attending groups: Yes. Participating in groups: Yes. Taking medication as prescribed: Yes. Toleration medication: Yes. Family/Significant other contact made: No, will contact:  Rowe Warman (son) [phone] Patient understands diagnosis: No. Discussing patient identified problems/goals with staff: Yes. Medical problems stabilized or resolved: Yes. Denies suicidal/homicidal ideation: Yes. Issues/concerns per patient self-inventory: No.   New problem(s) identified: No, Describe:  none   New Short Term/Long Term Goal(s): medication stabilization, elimination of SI thoughts, development of comprehensive mental wellness plan.      Patient Goals:  Medications and go to group but there is nothing wrong with me   Discharge Plan or Barriers: Patient recently admitted. CSW will continue to follow and assess for appropriate referrals and possible discharge planning.      Reason for Continuation of Hospitalization: Delusions  Mania Medication stabilization   Estimated Length of Stay: 5-7 days Last 3 [place] Suicide Severity Risk Score: Flowsheet Row Ad[facility] (Current) [facility] Hosp-Admission (Discharged) from 12/28/2022 in [facility] 3E HF PCU ED from 08/24/2022 in [facility] Emergency Department at [facility]  C-SSRS RISK CATEGORY No Risk No Risk No Risk    Last PHQ 2/9 Scores:    03/11/2022    1:28 PM  Depression screen PHQ  2/9  Decreased Interest 0  Down, Depressed, Hopeless 0  PHQ - 2 Score 0    Scribe for Treatment Team: Derick JONELLE Blanch, LCSW 07/30/2024 8:57 AM

## 2024-07-30 NOTE — Group Note (Signed)
 Date:  07/30/2024 Time:  11:35 AM  Group Topic/Focus:  Emotional Education:   The focus of this group is to discuss what feelings/emotions are, and how they are experienced.    Participation Level:  Active  Participation Quality:  Appropriate  Affect:  Appropriate  Cognitive:  Appropriate  Insight: Improving  Engagement in Group:  Engaged  Modes of Intervention:  Discussion and Education  Additional Comments:  Video on Emotional Wellness.   [name] [name] [name] 07/30/2024, 11:35 AM
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
## 2024-07-30 NOTE — Progress Notes (Signed)
(  Sleep Hours) -7.75  (Any PRNs that were needed, meds refused, or side effects to meds)- Vistaril  25 mg, Advil  200 mg , Nicotine  gum , Trazodone  50 mg  (Any disturbances and when (visitation, over night)-none  (Concerns raised by the patient)- none  (SI/HI/AVH)- denies

## 2024-07-31 DIAGNOSIS — F312 Bipolar disorder, current episode manic severe with psychotic features: Secondary | ICD-10-CM | POA: Diagnosis not present

## 2024-07-31 MED ORDER — HALOPERIDOL DECANOATE 100 MG/ML IM SOLN: 150.0000 mg | INTRAMUSCULAR | Status: DC

## 2024-07-31 MED ORDER — HALOPERIDOL DECANOATE 100 MG/ML IM SOLN
50.0000 mg | Freq: Once | INTRAMUSCULAR | Status: AC
  Administered 2024-08-03: 50 mg via INTRAMUSCULAR
  Filled 2024-07-31: qty 1

## 2024-07-31 MED ORDER — HALOPERIDOL DECANOATE 100 MG/ML IM SOLN
100.0000 mg | Freq: Once | INTRAMUSCULAR | Status: AC
  Administered 2024-07-31: 100 mg via INTRAMUSCULAR
  Filled 2024-07-31: qty 1

## 2024-07-31 NOTE — Group Note (Signed)
 Date:  07/31/2024 Time:  10:32 AM  Group Topic/Focus:  Emotional Education:   The focus of this group is to discuss what feelings/emotions are, and how they are experienced.    Participation Level:  Active  Participation Quality:  Attentive  Affect:  Flat  Cognitive:  Alert  Insight: Limited  Engagement in Group:  Limited  Modes of Intervention:  Education   [employer] 07/31/2024, 10:32 AM
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
## 2024-07-31 NOTE — Group Note (Signed)
 Date:  07/31/2024 Time:  3:26 PM  Group Topic/Focus:  Pharmacy Group     Participation Level:  Active  [name] [name] [name] 07/31/2024, 3:26 PM

## 2024-07-31 NOTE — Group Note (Signed)
 Date:  07/31/2024 Time:  8:37 PM  Group Topic/Focus:  Wrap-Up Group:   The focus of this group is to help patients review their daily goal of treatment and discuss progress on daily workbooks.    Participation Level:  Minimal  Participation Quality:  Appropriate and Sharing  Affect:  Appropriate and Flat  Cognitive:  Appropriate  Insight: Limited  Engagement in Group:  Engaged and Limited  Modes of Intervention:  Discussion and Socialization  Additional Comments:  Patients shared one high point and one low point from their day. Patient rated his day a 7 out of 10. Patient high point, attending all the meals.  Eward Mace 07/31/2024, 8:37 PM

## 2024-07-31 NOTE — Plan of Care (Signed)
   Problem: Education: Goal: Emotional status will improve Outcome: Progressing Goal: Mental status will improve Outcome: Progressing   Problem: Activity: Goal: Interest or engagement in activities will improve Outcome: Progressing Goal: Sleeping patterns will improve Outcome: Progressing
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
## 2024-07-31 NOTE — Group Note (Signed)
 Recreation Therapy Group Note   Group Topic:Self-Esteem  Group Date: 07/31/2024 Start Time: 1040 End Time: 1115 Facilitators: Neva Ramaswamy-McCall, LRT,CTRS Location: [address] Dayroom   Group Topic: Self-Esteem  Goal Area(s) Addresses:  Patient will successfully identify positive attributes about themselves.  Patient will identify healthy ways to increase self-esteem. Patient will acknowledge benefit(s) of improved self-esteem.   Behavioral Response: Disengaged  Intervention: Templates of blank picture frames; Markers  Activity: LRT and patients discussed how identity helps shape the different aspects of a persons life, such as how they think, describe or present yourself. Patients were to identify 4 identities that mean the most to them and draw a picture that corresponds with that identity. For example, drawing a horse for being an animal lover.  Education:  Self-Esteem, Discharge Planning  Education Outcome: Acknowledges education/In group clarification offered/Needs additional education   Affect/Mood: Flat   Participation Level: None   Participation Quality: None   Behavior: Disinterested   Speech/Thought Process: Barely audible    Insight: None   Judgement: None   Modes of Intervention: Art   Patient Response to Interventions:  Disengaged   Education Outcome:  In group clarification offered    Clinical Observations/Individualized Feedback: Pt didn't participate in group session.    Plan: Continue to engage patient in RT group sessions 2-3x/week.   Orah Sonnen-McCall, LRT,CTRS 07/31/2024 1:48 PM

## 2024-07-31 NOTE — Group Note (Signed)
 Date:  07/31/2024 Time:  11:49 AM  Group Topic/Focus: Recreation Therapy Orientation:      Participation Level:  Active   Giovannina Mun N Jerrid Forgette 07/31/2024, 11:49 AM

## 2024-07-31 NOTE — Progress Notes (Addendum)
 Collateral contact - [facility], [phone], [address], [place], [place] [address]   [facility] Social Worker Leotis Kitty [phone] called and scheduled an appointment.  Social Worker will visit patient in the hospital on Wednesday, 08/01/2024 at 9:00 AM.     Shulem Mader, LCSWA 07/31/2024

## 2024-07-31 NOTE — Group Note (Deleted)
 Date:  07/31/2024 Time:  9:08 AM  Group Topic/Focus:  Goals Group:   The focus of this group is to help patients establish daily goals to achieve during treatment and discuss how the patient can incorporate goal setting into their daily lives to aide in recovery. Orientation:   The focus of this group is to educate the patient on the purpose and policies of crisis stabilization and provide a format to answer questions about their admission.  The group details unit policies and expectations of patients while admitted.     Participation Level:  {BHH PARTICIPATION OZCZO:77735}  Participation Quality:  {BHH PARTICIPATION QUALITY:22265}  Affect:  {BHH AFFECT:22266}  Cognitive:  {BHH COGNITIVE:22267}  Insight: {BHH Insight2:20797}  Engagement in Group:  {BHH ENGAGEMENT IN HMNLE:77731}  Modes of Intervention:  {BHH MODES OF INTERVENTION:22269}  Additional Comments:  ***  [name] [name] N Theador Jezewski 07/31/2024, 9:08 AM

## 2024-07-31 NOTE — Progress Notes (Signed)
(  Sleep Hours) -7.25  (Any PRNs that were needed, meds refused, or side effects to meds)- none  (Any disturbances and when (visitation, over night)- none  (Concerns raised by the patient)- none  (SI/HI/AVH)-denies

## 2024-07-31 NOTE — Group Note (Unsigned)
 Date:  07/31/2024 Time:  4:38 PM  Group Topic/Focus:  Emotional Education:   The focus of this group is to discuss what feelings/emotions are, and how they are experienced. Wellness Toolbox:   The focus of this group is to discuss various aspects of wellness, balancing those aspects and exploring ways to increase the ability to experience wellness.  Patients will create a wellness toolbox for use upon discharge.     Participation Level:  {BHH PARTICIPATION OZCZO:77735}  Participation Quality:  {BHH PARTICIPATION QUALITY:22265}  Affect:  {BHH AFFECT:22266}  Cognitive:  {BHH COGNITIVE:22267}  Insight: {BHH Insight2:20797}  Engagement in Group:  {BHH ENGAGEMENT IN HMNLE:77731}  Modes of Intervention:  {BHH MODES OF INTERVENTION:22269}  Additional Comments:  ***  Amandeep Nesmith N Marvena Tally 07/31/2024, 4:38 PM

## 2024-07-31 NOTE — Group Note (Signed)
 Date:  07/31/2024 Time:  9:41 AM  Group Topic/Focus:  Goals Group:   The focus of this group is to help patients establish daily goals to achieve during treatment and discuss how the patient can incorporate goal setting into their daily lives to aide in recovery. Orientation:   The focus of this group is to educate the patient on the purpose and policies of crisis stabilization and provide a format to answer questions about their admission.  The group details unit policies and expectations of patients while admitted.    Participation Level:  Minimal  Participation Quality:  Appropriate  Affect:  Flat  Cognitive:  Appropriate  Insight: Limited  Engagement in Group:  Limited  Modes of Intervention:  Discussion   Lulabelle Desta N Jessey Huyett 07/31/2024, 9:41 AM

## 2024-07-31 NOTE — Group Note (Signed)
 Date:  07/31/2024 Time:  4:43 PM  Group Topic/Focus:  Emotional Education:   The focus of this group is to discuss what feelings/emotions are, and how they are experienced. Wellness Toolbox:   The focus of this group is to discuss various aspects of wellness, balancing those aspects and exploring ways to increase the ability to experience wellness.  Patients will create a wellness toolbox for use upon discharge.    Participation Level:  Minimal  Participation Quality:  Appropriate  Affect:  Flat  Cognitive:  Appropriate  Insight: Limited  Engagement in Group:  Limited  Modes of Intervention:  Education   [employer] 07/31/2024, 4:43 PM

## 2024-08-01 DIAGNOSIS — F312 Bipolar disorder, current episode manic severe with psychotic features: Secondary | ICD-10-CM | POA: Diagnosis not present

## 2024-08-01 NOTE — Group Note (Signed)
 Date:  08/01/2024 Time:  10:59 AM  Group Topic/Focus: Recreational Therapy Orientation:   Recreational Therapy    Participation Level:  Active   Dajanae Brophy N Quindon Denker 08/01/2024, 10:59 AM

## 2024-08-01 NOTE — Plan of Care (Signed)
  Problem: Activity: Goal: Interest or engagement in activities will improve Outcome: Progressing   Problem: Safety: Goal: Periods of time without injury will increase Outcome: Progressing

## 2024-08-01 NOTE — Group Note (Signed)
 Date:  08/01/2024 Time:  8:48 PM  Group Topic/Focus:  Wrap-Up Group:   The focus of this group is to help patients review their daily goal of treatment and discuss progress on daily workbooks.    Participation Level:  Active  Participation Quality:  Appropriate  Affect:  Appropriate  Cognitive:  Appropriate  Insight: Appropriate  Engagement in Group:  Developing/Improving  Modes of Intervention:  Discussion  Additional Comments:  Pt stated his goal for today was to focus on his treatment plan and set up his discharge plan. Pt stated he accomplished his goals today. Pt stated he talked with his doctor and social worker about his care today. Pt rated his overall day a 10. Pt stated he made no calls today. Pt stated he felt better about himself today. Pt stated he was able to attend all meals. Pt stated he took all medications provided today. Pt stated he attend all groups held today. Pt stated his appetite was pretty good today. Pt rated sleep last night was pretty good. Pt stated the goal tonight was to get some rest. Pt stated he had some physical pain tonight. Pt stated he had some minor pain in both his right and left feet. Pt rated the minor pain in his feet a 4 on the pain level scale. Pt nurse was updated on the situation. Pt deny visual hallucinations and auditory issues tonight. Pt denies thoughts of harming himself or others. Pt stated he would alert staff if anything changed  Lonni Na 08/01/2024, 8:48 PM

## 2024-08-01 NOTE — Group Note (Signed)
 Date:  08/01/2024 Time:  9:21 AM  Group Topic/Focus:  Goals Group:   The focus of this group is to help patients establish daily goals to achieve during treatment and discuss how the patient can incorporate goal setting into their daily lives to aide in recovery. Orientation:   The focus of this group is to educate the patient on the purpose and policies of crisis stabilization and provide a format to answer questions about their admission.  The group details unit policies and expectations of patients while admitted.    Participation Level:  Did Not Attend   Dena LOISE Dull 08/01/2024, 9:21 AM
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
## 2024-08-01 NOTE — Progress Notes (Signed)
" °   08/01/24 1400  Psych Admission Type (Psych Patients Only)  Admission Status Involuntary  Psychosocial Assessment  Patient Complaints None  Eye Contact Fair  Facial Expression Animated  Affect Appropriate to circumstance  Speech Logical/coherent  Interaction Assertive  Motor Activity Slow  Appearance/Hygiene In scrubs  Behavior Characteristics Cooperative  Mood Preoccupied  Thought Process  Coherency WDL  Content WDL  Delusions WDL  Perception WDL  Hallucination None reported or observed  Judgment Limited  Confusion None  Danger to Self  Current suicidal ideation? Denies  Danger to Others  Danger to Others None reported or observed    "

## 2024-08-01 NOTE — Progress Notes (Incomplete)
(  Sleep Hours) - (Any PRNs that were needed, meds refused, or side effects to meds)- PRN Hydroxyzine , PRN Ibuprofen , PRN Oxycodone , PRN Nicotine  Gum, and  PRN Trazodone  (Any disturbances and when (visitation, over night)- None (Concerns raised by the patient)- None (SI/HI/AVH)- Denies. Contracts for Safety.

## 2024-08-01 NOTE — Group Note (Signed)
 Date:  08/01/2024 Time:  11:43 AM  Group Topic/Focus: Physical wellness Self Care:   The focus of this group is to help patients understand the importance of self-care in order to improve or restore emotional, physical, spiritual, interpersonal, and financial health. Wellness Toolbox:   The focus of this group is to discuss various aspects of wellness, balancing those aspects and exploring ways to increase the ability to experience wellness.  Patients will create a wellness toolbox for use upon discharge.    Participation Level:  Active  Participation Quality:  Appropriate  Affect:  Flat  Cognitive:  Alert  Insight: Appropriate  Engagement in Group:  Limited  Modes of Intervention:  Education   [employer] 08/01/2024, 11:43 AM

## 2024-08-01 NOTE — Progress Notes (Addendum)
 Collateral contact - [facility], [phone], [address], [place], [place] [address]    [facility] Social Worker Leotis Kitty visited patient in person, and they met in the conference room.    Patient stated that  - loves living by himself - he goes to [facility] in [place] once per month for pain management.  He said that he has been doing this for years. - patient said he is receiving disability benefits - child psychotherapist told patient that if he gets discharged, she will see patient in his home again, and if he is still in the hospital, she would see him in the hospital.  After the meeting social worker said that patient has the capacity.   Seneca Hoback, LCSWA 08/01/2024

## 2024-08-01 NOTE — Group Note (Signed)
 Date:  08/01/2024 Time:  10:26 AM  Group Topic/Focus: Why its important to talk about mental health video Identifying Needs:   The focus of this group is to help patients understand the importance of talking about mental health    Participation Level:  Active  Participation Quality:  Appropriate  Affect:  Flat  Cognitive:  Alert  Insight: Appropriate  Engagement in Group:  Limited  Modes of Intervention:  Education   [name] [name] N [name] [name] 08/01/2024, 10:26 AM

## 2024-08-01 NOTE — Group Note (Signed)
 Date:  08/01/2024 Time:  3:30 PM  Group Topic/Focus:  Wellness Toolbox:   The focus of this group is to discuss various aspects of wellness, balancing those aspects and exploring ways to increase the ability to experience wellness.  Patients will create a wellness toolbox for use upon discharge.    Participation Level:  Minimal  Participation Quality:  Appropriate  Affect:  Appropriate  Cognitive:  Alert and Appropriate  Insight: Appropriate  Engagement in Group:  Engaged  Modes of Intervention:  Activity    Almarie MALVA Lowers 08/01/2024, 3:30 PM

## 2024-08-01 NOTE — Plan of Care (Signed)
   Problem: Education: Goal: Emotional status will improve Outcome: Progressing   Problem: Activity: Goal: Interest or engagement in activities will improve Outcome: Progressing

## 2024-08-01 NOTE — Group Note (Signed)
 Date:  08/01/2024 Time:  3:23 PM  Group Topic/Focus: Social Wellness  Wellness Toolbox:   The focus of this group is to discuss various aspects of wellness, balancing those aspects and exploring ways to increase the ability to experience wellness.  Patients will create a wellness toolbox for use upon discharge.    Participation Level:  Active   [name] [name] 08/01/2024, 3:23 PM

## 2024-08-01 NOTE — Group Note (Signed)
 Recreation Therapy Group Note   Group Topic:Healthy Decision Making  Group Date: 08/01/2024 Start Time: 1023 End Time: 1051 Facilitators: [name] [name], LRT,CTRS Location: [address] Dayroom   Group Topic: Decision Making, Problem Solving, Communication  Goal Area(s) Addresses:  Patient will effectively work with peer towards shared goal.  Patient will identify factors that guided their decision making.  Patient will pro-socially communicate ideas during group session.   Behavioral Response: Engaged  Intervention: Survival Scenario - pencil, paper  Activity:  Patients were given a scenario that they were going to be stranded [place] before being rescued. Writer tasked them with making a list of 15 things they would choose to bring with them for survival. The list of items was prioritized most important to least. Each patient would come up with their own list, then work together to create a new list of 15 items while in a group of 3-5 peers. LRT discussed each person's list and how it differed from others. The debrief included discussion of priorities, good decisions versus bad decisions, and how it is important to think before acting so we can make the best decision possible. LRT tied the concept of effective communication among group members to patient's support systems outside of the hospital and its benefit post discharge.  Education: Pharmacist, Community, Priorities, Support System, Discharge Planning    Education Outcome: Acknowledges education/In group clarification/Needs additional education   Affect/Mood: Appropriate   Participation Level: Engaged   Participation Quality: Independent   Behavior: Appropriate   Speech/Thought Process: Focused   Insight: Good   Judgement: Good   Modes of Intervention: Activity   Patient Response to Interventions:  Engaged   Education Outcome:  In group clarification offered    Clinical  Observations/Individualized Feedback: Pt was bright and focused. Pt came up with items like fishing line, chlorine pills, satellite phone, shovel, bow saw and ax for his individual list. Pt would explain his reasoning for adding certain items to the groups list. Peers also gave pt compliments for knowing how to survive and what exactly to use.     Plan: Continue to engage patient in RT group sessions 2-3x/week.   [name] [name], LRT,CTRS 08/01/2024 2:01 PM

## 2024-08-02 DIAGNOSIS — F312 Bipolar disorder, current episode manic severe with psychotic features: Secondary | ICD-10-CM | POA: Diagnosis not present
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
## 2024-08-02 NOTE — [facility] Group Notes (Signed)
 Adult Psychoeducational Group Note  Date:  08/02/2024 Time:  10:38 AM  Group Topic/Focus: Rec. Therapy  Participation Level:  Active  Participation Quality:  Attentive  Affect:  Appropriate  Cognitive:  Alert and Appropriate  Insight: Appropriate, Good, and Improving  Engagement in Group:  Engaged and Improving  Modes of Intervention:  Activity  Additional Comments:   Ronnell Puller 08/02/2024, 10:38 AM

## 2024-08-02 NOTE — [facility] Group Notes (Signed)
 [facility] Group Notes:  (Nursing/MHT/Case Management/Adjunct)  Date:  08/02/2024  Time:  9:01 PM  Type of Therapy:  Wrap up group  Participation Level:  Active  Participation Quality:  Appropriate  Affect:  Appropriate and Blunted  Cognitive:  Appropriate  Insight:  Appropriate  Engagement in Group:  Engaged  Modes of Intervention:  Education  Summary of Progress/Problems:Pt. States no goal. Rated day 7/10.  Grayce LITTIE Essex 08/02/2024, 9:01 PM

## 2024-08-02 NOTE — [facility] Group Notes (Signed)
 Adult Psychoeducational Group Note  Date:  08/02/2024 Time:  4:40 PM  Group Topic/Focus: Occupational Therapy  Participation Level:  Active  Participation Quality:    Affect:    Cognitive:    Insight:   Engagement in Group:    Modes of Intervention:    Additional Comments:  Pt attended Occupational Therapy  Anav Lammert O 08/02/2024, 4:40 PM

## 2024-08-02 NOTE — Group Note (Signed)
 Occupational Therapy Group Note  Group Topic:Coping Skills  Group Date: 08/02/2024 Start Time: 1530 End Time: 1600 Facilitators: Dot [place] MATSU, OT   Group Description: Group encouraged increased engagement and participation through discussion and activity focused on Coping Ahead. Patients were split up into teams and selected a card from a stack of positive coping strategies. Patients were instructed to act out/charade the coping skill for other peers to guess and receive points for their team. Discussion followed with a focus on identifying additional positive coping strategies and patients shared how they were going to cope ahead over the weekend while continuing hospitalization stay.  Therapeutic Goal(s): Identify positive vs negative coping strategies. Identify coping skills to be used during hospitalization vs coping skills outside of hospital/at home Increase participation in therapeutic group environment and promote engagement in treatment   Participation Level: Engaged   Participation Quality: Independent   Behavior: Appropriate   Speech/Thought Process: Relevant   Affect/Mood: Appropriate   Insight: Fair   Judgement: Fair      Modes of Intervention: Education  Patient Response to Interventions:  Attentive   Plan: Continue to engage patient in OT groups 2 - 3x/week.  08/02/2024  [place] MATSU Dot, OT  [name] [name], OT

## 2024-08-02 NOTE — Plan of Care (Signed)
   Problem: Activity: Goal: Interest or engagement in activities will improve Outcome: Progressing Goal: Sleeping patterns will improve Outcome: Progressing

## 2024-08-02 NOTE — Progress Notes (Signed)
(  Sleep Hours) -5.25   (Any PRNs that were needed, meds refused, or side effects to meds)- Vistaril  , Advil  , Oxycodone , Trazodone , nicotine  gum per MAR  (Any disturbances and when (visitation, over night)- none  (Concerns raised by the patient)- Pt focused on taking Oxycodone , Advil , Methadone  at the same time that's what I do at night to sleep  (SI/HI/AVH)- denies

## 2024-08-02 NOTE — Progress Notes (Signed)
" °   08/02/24 1100  Psych Admission Type (Psych Patients Only)  Admission Status Involuntary  Psychosocial Assessment  Patient Complaints Worrying  Eye Contact Fair  Facial Expression Animated  Affect Anxious;Appropriate to circumstance  Speech Logical/coherent  Interaction Assertive  Motor Activity Other (Comment) (wnl)  Appearance/Hygiene Unremarkable  Behavior Characteristics Cooperative  Mood Pleasant  Thought Process  Coherency WDL  Content WDL  Delusions None reported or observed  Perception WDL  Hallucination None reported or observed  Judgment Limited  Confusion None  Danger to Self  Current suicidal ideation? Denies  Danger to Others  Danger to Others None reported or observed    "

## 2024-08-02 NOTE — Plan of Care (Signed)
  Problem: Education: Goal: Knowledge of [facility] Education information/materials will improve Outcome: Progressing Goal: Emotional status will improve Outcome: Progressing Goal: Mental status will improve Outcome: Progressing Goal: Verbalization of understanding the information provided will improve Outcome: Progressing   Problem: Activity: Goal: Interest or engagement in activities will improve Outcome: Progressing Goal: Sleeping patterns will improve Outcome: Progressing   Problem: Coping: Goal: Ability to verbalize frustrations and anger appropriately will improve Outcome: Progressing Goal: Ability to demonstrate self-control will improve Outcome: Progressing   Problem: Health Behavior/Discharge Planning: Goal: Identification of resources available to assist in meeting health care needs will improve Outcome: Progressing Goal: Compliance with treatment plan for underlying cause of condition will improve Outcome: Progressing   Problem: Physical Regulation: Goal: Ability to maintain clinical measurements within normal limits will improve Outcome: Progressing   Problem: Safety: Goal: Periods of time without injury will increase Outcome: Progressing   Problem: Education: Goal: Knowledge of [facility] Education information/materials will improve Outcome: Progressing Goal: Emotional status will improve Outcome: Progressing Goal: Mental status will improve Outcome: Progressing Goal: Verbalization of understanding the information provided will improve Outcome: Progressing   Problem: Activity: Goal: Interest or engagement in activities will improve Outcome: Progressing Goal: Sleeping patterns will improve Outcome: Progressing   Problem: Coping: Goal: Ability to verbalize frustrations and anger appropriately will improve Outcome: Progressing Goal: Ability to demonstrate self-control will improve Outcome: Progressing   Problem: Health  Behavior/Discharge Planning: Goal: Identification of resources available to assist in meeting health care needs will improve Outcome: Progressing Goal: Compliance with treatment plan for underlying cause of condition will improve Outcome: Progressing   Problem: Physical Regulation: Goal: Ability to maintain clinical measurements within normal limits will improve Outcome: Progressing   Problem: Safety: Goal: Periods of time without injury will increase Outcome: Progressing   Problem: Activity: Goal: Will verbalize the importance of balancing activity with adequate rest periods Outcome: Progressing   Problem: Education: Goal: Will be free of psychotic symptoms Outcome: Progressing Goal: Knowledge of the prescribed therapeutic regimen will improve Outcome: Progressing   Problem: Coping: Goal: Coping ability will improve Outcome: Progressing Goal: Will verbalize feelings Outcome: Progressing   Problem: Health Behavior/Discharge Planning: Goal: Compliance with prescribed medication regimen will improve Outcome: Progressing   Problem: Nutritional: Goal: Ability to achieve adequate nutritional intake will improve Outcome: Progressing   Problem: Role Relationship: Goal: Ability to communicate needs accurately will improve Outcome: Progressing Goal: Ability to interact with others will improve Outcome: Progressing   Problem: Safety: Goal: Ability to redirect hostility and anger into socially appropriate behaviors will improve Outcome: Progressing Goal: Ability to remain free from injury will improve Outcome: Progressing   Problem: Self-Care: Goal: Ability to participate in self-care as condition permits will improve Outcome: Progressing   Problem: Self-Concept: Goal: Will verbalize positive feelings about self Outcome: Progressing   Problem: Activity: Goal: Will identify at least one activity in which they can participate Outcome: Progressing   Problem:  Coping: Goal: Ability to identify and develop effective coping behavior will improve Outcome: Progressing Goal: Ability to interact with others will improve Outcome: Progressing Goal: Demonstration of participation in decision-making regarding own care will improve Outcome: Progressing Goal: Ability to use eye contact when communicating with others will improve Outcome: Progressing   Problem: Health Behavior/Discharge Planning: Goal: Identification of resources available to assist in meeting health care needs will improve Outcome: Progressing   Problem: Self-Concept: Goal: Will verbalize positive feelings about self Outcome: Progressing

## 2024-08-02 NOTE — [facility] Group Notes (Signed)
 Adult Psychoeducational Group Note  Date:  08/02/2024 Time:  4:27 PM  Group Topic/Focus: Physical Wellness  Participation Level:  Active  Participation Quality:  Appropriate  Affect:  Appropriate  Cognitive:  Appropriate  Insight: Appropriate  Engagement in Group:  Engaged  Modes of Intervention:  Discussion  Additional Comments:  Pt attended the physical wellness group and remained appropriate and engaged throughout the duration of the group.   Felesha Moncrieffe O 08/02/2024, 4:27 PM

## 2024-08-02 NOTE — [facility] Group Notes (Signed)
 Adult Psychoeducational Group Note  Date:  08/02/2024 Time:  9:48 AM  Group Topic/Focus:  Goals Group:   The focus of this group is to help patients establish daily goals to achieve during treatment and discuss how the patient can incorporate goal setting into their daily lives to aide in recovery. Orientation:   The focus of this group is to educate the patient on the purpose and policies of crisis stabilization and provide a format to answer questions about their admission.  The group details unit policies and expectations of patients while admitted.  Participation Level:  Active  Participation Quality:  Appropriate  Affect:  Appropriate  Cognitive:  Appropriate  Insight: Appropriate  Engagement in Group:  Engaged  Modes of Intervention:  Discussion  Additional Comments:  Pt attended the goals group and remained appropriate and engaged throughout the duration of the group.   Mihika Surrette O 08/02/2024, 9:48 AM

## 2024-08-02 NOTE — Care Management Important Message (Signed)
 Medicare IM printed and social work attempted to give to patient to sign, but he refused.

## 2024-08-02 NOTE — Group Note (Signed)
 Recreation Therapy Group Note   Group Topic:Problem Solving  Group Date: 08/02/2024 Start Time: 1009 End Time: 1029 Facilitators: Charlean Carneal-McCall, LRT,CTRS Location: [address] Day[address]   Group Topic: Communication, Team Building, Problem Solving  Goal Area(s) Addresses:  Patient will effectively work with peer towards shared goal.  Patient will identify skills used to make activity successful.  Patient will identify how skills used during activity can be used to reach post d/c goals.   Behavioral Response: Active  Intervention: STEM Activity  Activity: Landing Pad. In teams of 3-5, patients were given 12 plastic drinking straws and an equal length of masking tape. Using the materials provided, patients were asked to build a landing pad to catch a golf ball dropped from approximately 5 feet in the air. All materials were required to be used by the team in their design. LRT facilitated post-activity discussion.  Education: Pharmacist, Community, Scientist, Physiological, Discharge Planning   Education Outcome: Acknowledges education/In group clarification offered/Needs additional education.    Affect/Mood: Appropriate   Participation Level: Active   Participation Quality: Independent   Behavior: Appropriate   Speech/Thought Process: Focused   Insight: Moderate   Judgement: Moderate   Modes of Intervention: STEM Activity   Patient Response to Interventions:  Receptive   Education Outcome:  In group clarification offered    Clinical Observations/Individualized Feedback: Pt was bright and engaged. Pt worked with his peer in trying to figure out how to structure their landing pad. Pt stated he could have done the activity if he had some scissors.     Plan: Continue to engage patient in RT group sessions 2-3x/week.   Eliannah Hinde-McCall, LRT,CTRS  08/02/2024 12:15 PM

## 2024-08-03 ENCOUNTER — Encounter (HOSPITAL_COMMUNITY): Payer: Self-pay

## 2024-08-03 DIAGNOSIS — F312 Bipolar disorder, current episode manic severe with psychotic features: Secondary | ICD-10-CM | POA: Diagnosis not present

## 2024-08-03 NOTE — Plan of Care (Signed)
   Problem: Education: Goal: Emotional status will improve Outcome: Progressing Goal: Mental status will improve Outcome: Progressing

## 2024-08-03 NOTE — Progress Notes (Signed)
 Tour of Duty:  Prentice JINNY Angle, RN, 08/03/2024, Tour of Duty: 0700-1900  SI/HI/AVH: Denies  Self-Reported   Mood: Negative  Anxiety: Denies Depression: Denies, but Observable Irritability: Denies  Broset  Violence Prevention Guidelines *See Row Information*: Small Violence Risk interventions implemented   LBM  Last BM Date : 08/02/24   Pain: not present  Patient Refusals (including Rx): No  >>Shift Summary: Patient observed to be calm but withdrawn to self on unit. Patient able to make needs known. Patient observed to engage appropriately with staff and peers but little to no intiation. Patient taking medications as prescribed. This shift, PRN medication requested or required. No reported or observed side effects to medication. No reported or observed agitation, aggression, or other acute emotional distress. No reported or observed physical abnormalities or concerns.  Last Vitals  Vitals Weight: 98.1 kg Temp: 97.8 F (36.6 C) Temp Source: Oral Pulse Rate: 85 Resp: 16 BP: (!) 146/87 Patient Position: (not recorded)  Admission Type  Psych Admission Type (Psych Patients Only) Admission Status: Involuntary Date 72 hour document signed : (not recorded) Time 72 hour document signed : (not recorded) Provider Notified (First and Last Name) (see details for LINK to note): (not recorded)   Psychosocial Assessment  Psychosocial Assessment Patient Complaints: None Eye Contact: Fair Facial Expression: Flat Affect: Flat, Preoccupied Speech: Slow, Soft Interaction: Assertive Motor Activity: Shuffling, Slow Appearance/Hygiene: Unremarkable Behavior Characteristics: Cooperative, Anxious Mood: Anxious, Preoccupied   Aggressive Behavior  Targets: (not recorded)   Thought Process  Thought Process Coherency: Within Defined Limits Content: Within Defined Limits Delusions: None reported or observed Perception: Within Defined Limits Hallucination: None reported or  observed Judgment: Limited Confusion: None  Danger to Self/Others  Danger to Self Current suicidal ideation?: Denies Description of Suicide Plan: (not recorded) Self-Injurious Behavior: (not recorded) Agreement Not to Harm Self: (not recorded) Description of Agreement: (not recorded) Danger to Others: None reported or observed

## 2024-08-03 NOTE — Group Note (Signed)
 Recreation Therapy Group Note   Group Topic:General Recreation  Group Date: 08/03/2024 Start Time: 1040 End Time: 1105 Facilitators: Gabbie Marzo-McCall, LRT,CTRS Location: [address] Dayroom   Group Topic/Focus: General Recreation   Goal Area(s) Addresses:  Patient will use appropriate interactions in play with peers.   Patient will follow directions on first prompt.  Behavioral Response: Engaged  Intervention: Play and Mental Exercise  Activity: Patients were allowed free play during recreation therapy group session today. Patients had to option of playing Jenga, [place], listen to music or watch a movie.    Affect/Mood: Appropriate   Participation Level: Active   Participation Quality: Independent   Behavior: Appropriate   Speech/Thought Process: Focused   Insight: Good   Judgement: Good   Modes of Intervention: General Recreation   Patient Response to Interventions:  Engaged   Education Outcome:  In group clarification offered    Clinical Observations/Individualized Feedback: Pt was bright and focused during group session. Pt opted to watch a movie during group. Pt was attentive and engaged with the movie.      Plan: Continue to engage patient in RT group sessions 2-3x/week.   Zerek Litsey-McCall, LRT,CTRS 08/03/2024 1:40 PM
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
## 2024-08-03 NOTE — BH IP Treatment Plan (Signed)
 Interdisciplinary Treatment and Diagnostic Plan Update  08/03/2024 Time of Session: 11:50 AM - [name] [name] MRN: [id number]  Principal Diagnosis: Bipolar 1 disorder, severe, current or most recent episode manic, with psychotic features (HCC)  Secondary Diagnoses: Principal Problem:   Bipolar 1 disorder, severe, current or most recent episode manic, with psychotic features (HCC) Active Problems:   Essential hypertension   Chronic back pain   Current Medications:  Current Facility-Administered Medications  Medication Dose Route Frequency Provider Last Rate Last Admin   alum & mag hydroxide-simeth (MAALOX/MYLANTA) 200-200-20 MG/5ML suspension 30 mL  30 mL Oral Q4H PRN Motley-Mangrum, Jadeka A, PMHNP   30 mL at 08/03/24 1517   amLODipine  (NORVASC ) tablet 7.5 mg  7.5 mg Oral Daily Collene Gouge I, NP   7.5 mg at 08/03/24 0827   benztropine  (COGENTIN ) tablet 1 mg  1 mg Oral BID Nwoko, Agnes I, NP   1 mg at 08/03/24 9171   cloNIDine  (CATAPRES ) tablet 0.1 mg  0.1 mg Oral BID PRN Prentis Kitchens A, DO   0.1 mg at 07/25/24 1012   haloperidol  (HALDOL ) tablet 5 mg  5 mg Oral TID PRN Motley-Mangrum, Jadeka A, PMHNP       And   diphenhydrAMINE  (BENADRYL ) capsule 50 mg  50 mg Oral TID PRN Motley-Mangrum, Jadeka A, PMHNP       haloperidol  lactate (HALDOL ) injection 5 mg  5 mg Intramuscular TID PRN Motley-Mangrum, Jadeka A, PMHNP       And   diphenhydrAMINE  (BENADRYL ) injection 50 mg  50 mg Intramuscular TID PRN Motley-Mangrum, Jadeka A, PMHNP       And   LORazepam  (ATIVAN ) injection 2 mg  2 mg Intramuscular TID PRN Motley-Mangrum, Jadeka A, PMHNP       haloperidol  lactate (HALDOL ) injection 10 mg  10 mg Intramuscular TID PRN Motley-Mangrum, Jadeka A, PMHNP       And   diphenhydrAMINE  (BENADRYL ) injection 50 mg  50 mg Intramuscular TID PRN Motley-Mangrum, Jadeka A, PMHNP       And   LORazepam  (ATIVAN ) injection 2 mg  2 mg Intramuscular TID PRN Motley-Mangrum, Jadeka A, PMHNP       haloperidol   (HALDOL ) tablet 10 mg  10 mg Oral QHS Bennett, Christal H, NP   10 mg at 08/02/24 2104   [START ON 09/03/2024] haloperidol  decanoate (HALDOL  DECANOATE) 100 MG/ML injection 150 mg  150 mg Intramuscular Q30 days Bennett, Christal H, NP       hydrOXYzine  (ATARAX ) tablet 25 mg  25 mg Oral TID PRN Blair, Christal H, NP   25 mg at 08/02/24 2104   ibuprofen  (ADVIL ) tablet 200 mg  200 mg Oral Q6H PRN Onuoha, Chinwendu V, NP   200 mg at 08/02/24 2105   magnesium  hydroxide (MILK OF MAGNESIA) suspension 30 mL  30 mL Oral Daily PRN Motley-Mangrum, Jadeka A, PMHNP       methadone  (DOLOPHINE ) tablet 5 mg  5 mg Oral Q12H Motley-Mangrum, Jadeka A, PMHNP   5 mg at 08/03/24 0827   metoprolol  tartrate (LOPRESSOR ) tablet 12.5 mg  12.5 mg Oral BID Motley-Mangrum, Jadeka A, PMHNP   12.5 mg at 08/03/24 9167   nicotine  (NICODERM CQ  - dosed in mg/24 hours) patch 14 mg  14 mg Transdermal Daily Bennett, Christal H, NP   14 mg at 08/03/24 0830   nicotine  polacrilex (NICORETTE ) gum 2 mg  2 mg Oral PRN Blair, Christal H, NP   2 mg at 08/03/24 1517   oxyCODONE  (Oxy IR/ROXICODONE ) immediate  release tablet 10 mg  10 mg Oral BID PRN Blair, Christal H, NP   10 mg at 08/02/24 2104   traZODone  (DESYREL ) tablet 50 mg  50 mg Oral QHS PRN Trudy Carwin, NP   50 mg at 08/02/24 2104   Vitamin D  (Ergocalciferol ) (DRISDOL ) 1.25 MG (50000 UNIT) capsule 50,000 Units  50,000 Units Oral Q7 days Ntuen, Tina C, FNP   50,000 Units at 08/02/24 1724   PTA Medications: Medications Prior to Admission  Medication Sig Dispense Refill Last Dose/Taking   methadone  (DOLOPHINE ) 10 MG tablet Take 10 mg by mouth in the morning, at noon, and at bedtime. Jenelle Groseman- PA      methadone  (DOLOPHINE ) 5 MG tablet Take 5 mg by mouth every 12 (twelve) hours.      naloxone  (NARCAN ) nasal spray 4 mg/0.1 mL SMARTSIG:Spray(s) Both Nares      Oxycodone  HCl 10 MG TABS Take 10 mg by mouth 4 (four) times daily.       Patient Stressors: Health problems    Medication change or noncompliance    Patient Strengths: Capable of independent living  Communication skills  Supportive family/friends   Treatment Modalities: Medication Management, Group therapy, Case management,  1 to 1 session with clinician, Psychoeducation, Recreational therapy.   Physician Treatment Plan for Primary Diagnosis: Bipolar 1 disorder, severe, current or most recent episode manic, with psychotic features (HCC) Long Term Goal(s): Improvement in symptoms so as ready for discharge   Short Term Goals: Ability to identify changes in lifestyle to reduce recurrence of condition will improve Ability to verbalize feelings will improve Ability to disclose and discuss suicidal ideas Ability to demonstrate self-control will improve Ability to identify and develop effective coping behaviors will improve Ability to maintain clinical measurements within normal limits will improve Compliance with prescribed medications will improve Ability to identify triggers associated with substance abuse/mental health issues will improve  Medication Management: Evaluate patient's response, side effects, and tolerance of medication regimen.  Therapeutic Interventions: 1 to 1 sessions, Unit Group sessions and Medication administration.  Evaluation of Outcomes: Progressing  Physician Treatment Plan for Secondary Diagnosis: Principal Problem:   Bipolar 1 disorder, severe, current or most recent episode manic, with psychotic features (HCC) Active Problems:   Essential hypertension   Chronic back pain  Long Term Goal(s): Improvement in symptoms so as ready for discharge   Short Term Goals: Ability to identify changes in lifestyle to reduce recurrence of condition will improve Ability to verbalize feelings will improve Ability to disclose and discuss suicidal ideas Ability to demonstrate self-control will improve Ability to identify and develop effective coping behaviors will improve Ability  to maintain clinical measurements within normal limits will improve Compliance with prescribed medications will improve Ability to identify triggers associated with substance abuse/mental health issues will improve     Medication Management: Evaluate patient's response, side effects, and tolerance of medication regimen.  Therapeutic Interventions: 1 to 1 sessions, Unit Group sessions and Medication administration.  Evaluation of Outcomes: Progressing   RN Treatment Plan for Primary Diagnosis: Bipolar 1 disorder, severe, current or most recent episode manic, with psychotic features (HCC) Long Term Goal(s): Knowledge of disease and therapeutic regimen to maintain health will improve  Short Term Goals: Ability to remain free from injury will improve, Ability to verbalize frustration and anger appropriately will improve, Ability to verbalize feelings will improve, and Ability to disclose and discuss suicidal ideas  Medication Management: RN will administer medications as ordered by provider, will assess and evaluate  patient's response and provide education to patient for prescribed medication. RN will report any adverse and/or side effects to prescribing provider.  Therapeutic Interventions: 1 on 1 counseling sessions, Psychoeducation, Medication administration, Evaluate responses to treatment, Monitor vital signs and CBGs as ordered, Perform/monitor CIWA, COWS, AIMS and Fall Risk screenings as ordered, Perform wound care treatments as ordered.  Evaluation of Outcomes: Progressing   LCSW Treatment Plan for Primary Diagnosis: Bipolar 1 disorder, severe, current or most recent episode manic, with psychotic features (HCC) Long Term Goal(s): Safe transition to appropriate next level of care at discharge, Engage patient in therapeutic group addressing interpersonal concerns.  Short Term Goals: Engage patient in aftercare planning with referrals and resources, Increase ability to appropriately  verbalize feelings, Facilitate acceptance of mental health diagnosis and concerns, and Identify triggers associated with mental health/substance abuse issues  Therapeutic Interventions: Assess for all discharge needs, 1 to 1 time with Social worker, Explore available resources and support systems, Assess for adequacy in community support network, Educate family and significant other(s) on suicide prevention, Complete Psychosocial Assessment, Interpersonal group therapy.  Evaluation of Outcomes: Progressing   Progress in Treatment: Attending groups: Yes. Participating in groups: Yes. Taking medication as prescribed: Yes. Toleration medication: Yes. Family/Significant other contact made: Yes, contacted:  Lashon Hillier (son) [phone] Patient understands diagnosis: No. Discussing patient identified problems/goals with staff: Yes. Medical problems stabilized or resolved: Yes. Denies suicidal/homicidal ideation: Yes. Issues/concerns per patient self-inventory: No.   New problem(s) identified: No, Describe:  none   New Short Term/Long Term Goal(s): medication stabilization, elimination of SI thoughts, development of comprehensive mental wellness plan.      Patient Goals:  Medications and go to group but there is nothing wrong with me   Discharge Plan or Barriers: Patient recently admitted. CSW will continue to follow and assess for appropriate referrals and possible discharge planning.      Reason for Continuation of Hospitalization: Delusions  Mania Medication stabilization   Estimated Length of Stay: 1 - 2 days  Last 3 [place] Suicide Severity Risk Score: Flowsheet Row Ad[facility] (Current) [facility] Hosp-Admission (Discharged) from [facility]/28/2022 in [facility] 3E HF PCU ED from 08/24/2022 in [facility] Department [facility]  C-SSRS RISK CATEGORY No Risk No Risk No Risk    Last PHQ 2/9  Scores:    03/11/2022    1:28 PM  Depression screen PHQ 2/9  Decreased Interest 0  Down, Depressed, Hopeless 0  PHQ - 2 Score 0    Scribe for Treatment Team: Emmily Pellegrin O Andreea Arca, LCSWA 08/03/2024 6:52 PM

## 2024-08-03 NOTE — BHH Group Notes (Signed)
 Adult Psychoeducational Group Note  Date:  08/03/2024 Time:  1:36 PM  Group Topic/Focus: Chaplain Group  Participation Level:    Participation Quality:    Affect:    Cognitive:    Insight:   Engagement in Group:    Modes of Intervention:    Additional Comments:  Pt attended the chaplain group  Abbie Berling O 08/03/2024, 1:36 PM

## 2024-08-03 NOTE — BHH Group Notes (Signed)
 Adult Psychoeducational Group Note  Date:  08/03/2024 Time:  8:13 PM  Group Topic/Focus: Physical Wellness  Participation Level:  Did Not Attend  Participation Quality:    Affect:    Cognitive:    Insight:   Engagement in Group:    Modes of Intervention:    Additional Comments:    Meygan Kyser O 08/03/2024, 8:13 PM

## 2024-08-03 NOTE — BHH Group Notes (Signed)
 Adult Psychoeducational Group Note  Date:  08/03/2024 Time:  10:10 AM  Group Topic/Focus:  Goals Group:   The focus of this group is to help patients establish daily goals to achieve during treatment and discuss how the patient can incorporate goal setting into their daily lives to aide in recovery. Orientation:   The focus of this group is to educate the patient on the purpose and policies of crisis stabilization and provide a format to answer questions about their admission.  The group details unit policies and expectations of patients while admitted.  Participation Level:  Did Not Attend  Participation Quality:    Affect:    Cognitive:    Insight:   Engagement in Group:    Modes of Intervention:    Additional Comments:    Stavros Cail O 08/03/2024, 10:10 AM

## 2024-08-03 NOTE — Plan of Care (Signed)
   Problem: Education: Goal: Emotional status will improve Outcome: Progressing Goal: Mental status will improve Outcome: Progressing Goal: Verbalization of understanding the information provided will improve Outcome: Progressing   Problem: Activity: Goal: Interest or engagement in activities will improve Outcome: Progressing Goal: Sleeping patterns will improve Outcome: Progressing

## 2024-08-03 NOTE — Progress Notes (Signed)
(  Sleep Hours) -5.5  (Any PRNs that were needed, meds refused, or side effects to meds)- Mylanta, Vistaril , Advil , Oxycodone , Trazodone  per MAR.  (Any disturbances and when (visitation, over night)-none  (Concerns raised by the patient)- none  (SI/HI/AVH)-denies

## 2024-08-03 NOTE — BHH Group Notes (Signed)
 Spirituality Group   Description: Participant directed exploration of values, beliefs and meaning   **Focus on boundary management and self-regulation as a part of spiritual self-care; noticing how we impact others and reflecting on values to guide us    Following a brief framework of chaplains role and ground rules of group behavior, participants are invited to share concerns or questions that engage spiritual life. Emphasis placed on common themes and shared experiences and ways to make meaning and clarify living into ones values.   Theory/Process/Goal: Utilize the theoretical framework of group therapy established by Celena Kite, Relational Cultural Theory and Rogerian approaches to facilitate relational empathy and use of the here and now to foster reflection, self-awareness, and sharing.   Observations: [name] attended almost 0.5 of group. He was reserved but still engaged in the group discussion.  Ronie Barnhart L. Delores HERO.Div

## 2024-08-03 NOTE — BHH Group Notes (Signed)
 BHH Group Notes:  (Nursing/MHT/Case Management/Adjunct)  Date:  08/03/2024  Time:  9:12 PM  Type of Therapy:  Wrap up group  Participation Level:  Active  Participation Quality:  Appropriate  Affect:  Appropriate  Cognitive:  Appropriate  Insight:  Appropriate  Engagement in Group:  Engaged  Modes of Intervention:  Education  Summary of Progress/Problems: Goal to wake up. Rated day 7/10.  Grayce LITTIE Essex 08/03/2024, 9:12 PM

## 2024-08-03 NOTE — Progress Notes (Addendum)
 Collateral contact - [facility], [address], [place], [place] [address], [phone]  CSW informed one of the social workers at [facility] that patient will be discharged tomorrow, Saturday, 1/3.  He said that he will relay this information to patient's social worker, Leotis Kitty.  In addition to this, CSW left a voicemail for [facility], [phone].   Lela Murfin, LCSWA 08/04/2023

## 2024-08-03 NOTE — BHH Group Notes (Signed)
 Adult Psychoeducational Group Note  Date:  08/03/2024 Time:  1:34 PM  Group Topic/Focus: Recreation Therapy   Participation Level:    Participation Quality:    Affect:    Cognitive:    Insight:   Engagement in Group:    Modes of Intervention:    Additional Comments:  Pt attended recreation therapy  Lesslie Mossa O 08/03/2024, 1:34 PM

## 2024-08-04 MED ORDER — BENZTROPINE MESYLATE 1 MG PO TABS: 1.0000 mg | ORAL_TABLET | Freq: Two times a day (BID) | ORAL | 0 refills | Status: AC

## 2024-08-04 MED ORDER — NICOTINE 14 MG/24HR TD PT24: 14.0000 mg | MEDICATED_PATCH | Freq: Every day | TRANSDERMAL | 0 refills | Status: DC

## 2024-08-04 MED ORDER — OXYCODONE HCL 10 MG PO TABS: 10.0000 mg | ORAL_TABLET | Freq: Two times a day (BID) | ORAL | Status: AC | PRN

## 2024-08-04 MED ORDER — AMLODIPINE BESYLATE 2.5 MG PO TABS: 7.5000 mg | ORAL_TABLET | Freq: Every day | ORAL | 0 refills | Status: AC

## 2024-08-04 MED ORDER — VITAMIN D (ERGOCALCIFEROL) 1.25 MG (50000 UNIT) PO CAPS: 50000.0000 [IU] | ORAL_CAPSULE | ORAL | 0 refills | Status: AC

## 2024-08-04 MED ORDER — TRAZODONE HCL 50 MG PO TABS: 50.0000 mg | ORAL_TABLET | Freq: Every evening | ORAL | 0 refills | Status: AC | PRN

## 2024-08-04 MED ORDER — METOPROLOL TARTRATE 25 MG PO TABS: 12.5000 mg | ORAL_TABLET | Freq: Two times a day (BID) | ORAL | 0 refills | Status: DC

## 2024-08-04 MED ORDER — HALOPERIDOL DECANOATE 100 MG/ML IM SOLN: 150.0000 mg | INTRAMUSCULAR | 0 refills | Status: AC

## 2024-08-04 MED ORDER — HALOPERIDOL 10 MG PO TABS: 10.0000 mg | ORAL_TABLET | Freq: Every day | ORAL | 0 refills | Status: AC

## 2024-08-04 NOTE — Group Note (Signed)
 Date:  08/04/2024 Time:  9:51 AM  Group Topic/Focus:  Goals Group:   The focus of this group is to help patients establish daily goals to achieve during treatment and discuss how the patient can incorporate goal setting into their daily lives to aide in recovery.    Participation Level:  Minimal  Participation Quality:  Appropriate and Attentive  Affect:  Appropriate  Cognitive:  Appropriate  Insight: Appropriate  Engagement in Group:  Engaged  Modes of Intervention:  Discussion  Additional Comments:  n/a  Lavonna Lampron R Kareena Arrambide 08/04/2024, 9:51 AM

## 2024-08-04 NOTE — BHH Suicide Risk Assessment (Signed)
 [facility] Discharge Suicide Risk Assessment   Principal Problem: Bipolar 1 disorder, severe, current or most recent episode manic, with psychotic features [facility]) Discharge Diagnoses: Principal Problem:   Bipolar 1 disorder, severe, current or most recent episode manic, with psychotic features [facility]) Active Problems:   Essential hypertension   Chronic back pain  During the patient's hospitalization, patient had extensive initial psychiatric evaluation, and follow-up psychiatric evaluations every day.  Psychiatric diagnoses provided upon initial assessment:   Bipolar 1 disorder, severe, current or most recent episode manic, with psychotic features [facility]) Active Problems:   Essential hypertension   Chronic back pain  Patient's psychiatric medications were adjusted on admission: He was started on Haldol  and Cogentin .  During the hospitalization, other adjustments were made to the patient's psychiatric medication regimen: His Haldol  was titrated.  He was given Haldol  Dec 100 mg IM on 12/30 and his second dose of Haldol  Dec 50 mg on 1/2. His next Haldol  Dec 150 mg is due on 2/2.  Gradually, patient started adjusting to milieu.   Patient's care was discussed during the interdisciplinary team meeting every day during the hospitalization.  The patient is not having side effects to prescribed psychiatric medication.  The patient reports their target psychiatric symptoms of mania, psychosis, and paranoia responded well to the psychiatric medications, and the patient reports overall benefit other psychiatric hospitalization. Supportive psychotherapy was provided to the patient. The patient also participated in regular group therapy while admitted.   Labs were reviewed with the patient, and abnormal results were discussed with the patient.  The patient denied having suicidal thoughts more than 48 hours prior to discharge.  Patient denies having homicidal thoughts.  Patient denies having auditory  hallucinations.  Patient denies any visual hallucinations.  Patient denies having paranoid thoughts.  The patient is able to verbalize their individual safety plan to this provider.  It is recommended to the patient to continue psychiatric medications as prescribed, after discharge from the hospital.    It is recommended to the patient to follow up with your outpatient psychiatric provider and PCP.  Discussed with the patient, the impact of alcohol , drugs, tobacco have been there overall psychiatric and medical wellbeing, and total abstinence from substance use was recommended the patient.  Total Time spent with patient: 20 minutes  Musculoskeletal: Strength & Muscle Tone: within normal limits Gait & Station: normal Patient leans: N/A  Psychiatric Specialty Exam  Presentation  General Appearance:  Appropriate for Environment; Casual  Eye Contact: Good  Speech: Clear and Coherent; Normal Rate  Speech Volume: Normal  Handedness: Right   Mood and Affect  Mood: Euthymic  Duration of Depression Symptoms: No data recorded Affect: Appropriate; Congruent   Thought Process  Thought Processes: Coherent; Goal Directed  Descriptions of Associations:Intact  Orientation:Full (Time, Place and Person)  Thought Content:Logical; WDL  History of Schizophrenia/Schizoaffective disorder:No  Duration of Psychotic Symptoms:N/A  Hallucinations:Hallucinations: None  Ideas of Reference:None  Suicidal Thoughts:Suicidal Thoughts: No  Homicidal Thoughts:Homicidal Thoughts: No   Sensorium  Memory: Immediate Good; Recent Good  Judgment: Good  Insight: Present   Executive Functions  Concentration: Good  Attention Span: Good  Recall: Good  Fund of Knowledge: Good  Language: Good   Psychomotor Activity  Psychomotor Activity: Psychomotor Activity: Normal   Assets  Assets: Communication Skills; Desire for Improvement; Resilience; Social Support;  Housing   Sleep  Sleep: Sleep: Good  Estimated Sleeping Duration (Last 24 Hours): 3.75-4.50 hours  Physical Exam: Physical Exam Vitals and nursing note reviewed.  Constitutional:      General: He is not in acute distress.    Appearance: Normal appearance. He is obese. He is not ill-appearing or toxic-appearing.  HENT:     Head: Normocephalic and atraumatic.  Pulmonary:     Effort: Pulmonary effort is normal.  Musculoskeletal:        General: Normal range of motion.  Neurological:     General: No focal deficit present.     Mental Status: He is alert.    Review of Systems  Respiratory:  Negative for cough and shortness of breath.   Cardiovascular:  Negative for chest pain.  Gastrointestinal:  Negative for abdominal pain, constipation, diarrhea, nausea and vomiting.  Neurological:  Negative for dizziness, weakness and headaches.  Psychiatric/Behavioral:  Negative for depression, hallucinations and suicidal ideas. The patient is not nervous/anxious.    Blood pressure 136/76, pulse 72, temperature 97.7 F (36.5 C), temperature source Oral, resp. rate 16, height 5' 7 (1.702 m), weight 98.1 kg, SpO2 90%. Body mass index is 33.86 kg/m.  Mental Status Per Nursing Assessment::   On Admission:  NA  Demographic Factors:  Male, Divorced or widowed, Caucasian, Living alone, and Unemployed  Loss Factors: Decrease in vocational status and Loss of significant relationship  Historical Factors: Impulsivity  Risk Reduction Factors:   Responsible for children under 73 years of age, Sense of responsibility to family, and Positive social support  Continued Clinical Symptoms:  More than one psychiatric diagnosis Previous Psychiatric Diagnoses and Treatments Medical Diagnoses and Treatments/Surgeries  Cognitive Features That Contribute To Risk:  Polarized thinking    Suicide Risk:  Minimal: No identifiable suicidal ideation.  Patients presenting with no risk factors but with  morbid ruminations; may be classified as minimal risk based on the severity of the depressive symptoms   Follow-up Information     Services, [facility] Follow up.   Why: You may also go to this provider, to obtain appointments for therapy and medication management services. Contact information: [address] [place] [address] [phone]         [facility] [place]. Go on 08/07/2024.   Specialty: Behavioral Health Why: You have an appointment for therapy services on 08/07/24 at 10:00 am, in person. Contact information: [address] [place]  [address] [phone]        Carlette Benita Area, MD Follow up on 08/08/2024.   Specialty: Internal Medicine Why: Please call on 08/08/24 at 9:00 am to schedule an appointment for medication management services. Contact information: [address] [place] [address] [phone]                 Plan Of Care/Follow-up recommendations:  Activity: as tolerated  Diet: heart healthy  Other: -Follow-up with your outpatient psychiatric provider -instructions on appointment date, time, and address (location) are provided to you in discharge paperwork.  -Take your psychiatric medications as prescribed at discharge - instructions are provided to you in the discharge paperwork  -Follow-up with outpatient primary care doctor and other specialists -for management of chronic medical disease, including: Routine Care  -Testing: Follow-up with outpatient provider for abnormal lab results: Low Vitamin D .  -Recommend abstinence from alcohol , tobacco, and other illicit drug use at discharge.   -If your psychiatric symptoms recur, worsen, or if you have side effects to your psychiatric medications, call your outpatient psychiatric provider, 911, [phone] or go to the nearest emergency department.  -If suicidal thoughts recur, call  your outpatient psychiatric  provider, 911, [phone] or go to the nearest emergency department.   Marsa GORMAN Rosser, DO 08/04/2024, 9:28 AM

## 2024-08-04 NOTE — Progress Notes (Signed)
" °  [facility] Adult Case Management Discharge Plan :  Will you be returning to the same living situation after discharge:  Yes,  1030am [employer] home At discharge, do you have transportation home?: Yes,  1030am [employer] home scheduled. Patient completed the [facility] and Release of Liability. Do you have the ability to pay for your medications: Yes,  via insurance  Release of information consent forms completed and in the chart;  Patient's signature needed at discharge.  Patient to Follow up at:  Follow-up Information     Services, [facility] Follow up.   Why: You may also go to this provider, to obtain appointments for therapy and medication management services. Contact information: [address] [place] [address] [phone]         [facility] [place]. Go on 08/07/2024.   Specialty: Behavioral Health Why: You have an appointment for therapy services on 08/07/24 at 10:00 am, in person. Contact information: [address] [place]  [address] [phone]        Carlette Benita Area, MD Follow up on 08/08/2024.   Specialty: Internal Medicine Why: Please call on 08/08/24 at 9:00 am to schedule an appointment for medication management services. Contact information: [address] [place] [address] [phone]                 Next level of care provider has access to [facility] Link:no  Safety Planning and Suicide Prevention discussed: Yes,  completed 07/25/2024 with patient's son Kier Smead son ph: [phone], completed today with patient, and patient accepted the [place] Prevention Education brochure.  Suicide Prevention Education:  Education Completed; Carlin Cliche (patient)  has been provided the following suicide prevention education today.   The suicide prevention education provided includes the following: Suicide risk factors Suicide prevention and  interventions National Suicide Hotline telephone number [facility] assessment telephone number [facility] Emergency Assistance 911 [facility] and/or Residential Mobile Crisis Unit telephone number   Request made of family/significant other to: Remove weapons (e.g., guns, rifles, knives), all items previously/currently identified as safety concern.   Remove drugs/medications (over-the-counter, prescriptions, illicit drugs), all items previously/currently identified as a safety concern.   The family member/significant other verbalizes understanding of the suicide prevention education information provided.  The family member/significant other agrees to remove the items of safety concern listed above.     Has patient been referred to the Quitline?: Patient refused referral for treatment  Patient has been referred for addiction treatment: Patient refused referral for treatment.  [employer], LCSW 08/04/2024, 10:14 AM "
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
## 2024-08-04 NOTE — Progress Notes (Signed)
 Patient discharged off unit at 1040. Patient belongings reviewed and acknowledged by patient. AVS and Transition Record reviewed and acknowledged by patient. Safety plan completed by patient, reviewed by nurse with patient and copy provided. Any medications and or prescriptions necessary for discharge addressed and provided to patient. Patient denies SI, plan or intent. Denies HI. Denies AVH. No observed or reported side effects to medication. No observed or reported agitation, aggression, or other acute emotional distress. No reported or observed physical abnormalities or concerns. Patient transportation from facility verified and observed.

## 2024-08-07 ENCOUNTER — Ambulatory Visit (HOSPITAL_COMMUNITY): Payer: Medicare (Managed Care)

## 2024-08-07 NOTE — Progress Notes (Deleted)
 [name] [name] did not arrive for his scheduled assessment appointment today

## 2024-09-04 ENCOUNTER — Emergency Department (HOSPITAL_COMMUNITY): Payer: Medicare (Managed Care)

## 2024-09-04 ENCOUNTER — Encounter (HOSPITAL_COMMUNITY): Payer: Self-pay | Admitting: *Deleted

## 2024-09-04 ENCOUNTER — Inpatient Hospital Stay (HOSPITAL_COMMUNITY)
Admission: EM | Admit: 2024-09-04 | Discharge: 2024-09-06 | Disposition: A | Payer: Medicare (Managed Care) | Source: Home / Self Care | Attending: Internal Medicine | Admitting: Internal Medicine

## 2024-09-04 ENCOUNTER — Other Ambulatory Visit: Payer: Self-pay

## 2024-09-04 DIAGNOSIS — I25110 Atherosclerotic heart disease of native coronary artery with unstable angina pectoris: Secondary | ICD-10-CM | POA: Diagnosis present

## 2024-09-04 DIAGNOSIS — F1120 Opioid dependence, uncomplicated: Secondary | ICD-10-CM | POA: Diagnosis present

## 2024-09-04 DIAGNOSIS — F319 Bipolar disorder, unspecified: Secondary | ICD-10-CM | POA: Diagnosis present

## 2024-09-04 DIAGNOSIS — I10 Essential (primary) hypertension: Secondary | ICD-10-CM | POA: Diagnosis present

## 2024-09-04 DIAGNOSIS — R531 Weakness: Secondary | ICD-10-CM

## 2024-09-04 DIAGNOSIS — M545 Low back pain, unspecified: Secondary | ICD-10-CM | POA: Diagnosis present

## 2024-09-04 DIAGNOSIS — J189 Pneumonia, unspecified organism: Principal | ICD-10-CM | POA: Diagnosis present

## 2024-09-04 DIAGNOSIS — A419 Sepsis, unspecified organism: Secondary | ICD-10-CM | POA: Diagnosis not present

## 2024-09-04 DIAGNOSIS — J449 Chronic obstructive pulmonary disease, unspecified: Secondary | ICD-10-CM | POA: Diagnosis not present

## 2024-09-04 LAB — CBC WITH DIFFERENTIAL/PLATELET
Abs Immature Granulocytes: 0.07 10*3/uL (ref 0.00–0.07)
Basophils Absolute: 0.1 10*3/uL (ref 0.0–0.1)
Basophils Relative: 0 %
Eosinophils Absolute: 0.2 10*3/uL (ref 0.0–0.5)
Eosinophils Relative: 1 %
HCT: 50 % (ref 39.0–52.0)
Hemoglobin: 16.7 g/dL (ref 13.0–17.0)
Immature Granulocytes: 1 %
Lymphocytes Relative: 22 %
Lymphs Abs: 3.4 10*3/uL (ref 0.7–4.0)
MCH: 29 pg (ref 26.0–34.0)
MCHC: 33.4 g/dL (ref 30.0–36.0)
MCV: 86.8 fL (ref 80.0–100.0)
Monocytes Absolute: 0.9 10*3/uL (ref 0.1–1.0)
Monocytes Relative: 6 %
Neutro Abs: 10.7 10*3/uL — ABNORMAL HIGH (ref 1.7–7.7)
Neutrophils Relative %: 70 %
Platelets: 296 10*3/uL (ref 150–400)
RBC: 5.76 MIL/uL (ref 4.22–5.81)
RDW: 12.6 % (ref 11.5–15.5)
WBC: 15.3 10*3/uL — ABNORMAL HIGH (ref 4.0–10.5)
nRBC: 0 % (ref 0.0–0.2)

## 2024-09-04 LAB — BLOOD GAS, VENOUS
Acid-Base Excess: 0.3 mmol/L (ref 0.0–2.0)
Bicarbonate: 24.6 mmol/L (ref 20.0–28.0)
Drawn by: 4237
O2 Saturation: 92.5 %
Patient temperature: 36.7
pCO2, Ven: 37 mmHg — ABNORMAL LOW (ref 44–60)
pH, Ven: 7.42 (ref 7.25–7.43)
pO2, Ven: 52 mmHg — ABNORMAL HIGH (ref 32–45)

## 2024-09-04 LAB — URINE DRUG SCREEN
Amphetamines: NEGATIVE
Barbiturates: NEGATIVE
Benzodiazepines: POSITIVE — AB
Cocaine: NEGATIVE
Fentanyl: NEGATIVE
Methadone Scn, Ur: NEGATIVE
Opiates: NEGATIVE
Tetrahydrocannabinol: POSITIVE — AB

## 2024-09-04 LAB — COMPREHENSIVE METABOLIC PANEL WITH GFR
ALT: 12 U/L (ref 0–44)
AST: 13 U/L — ABNORMAL LOW (ref 15–41)
Albumin: 4.2 g/dL (ref 3.5–5.0)
Alkaline Phosphatase: 107 U/L (ref 38–126)
Anion gap: 10 (ref 5–15)
BUN: 11 mg/dL (ref 6–20)
CO2: 26 mmol/L (ref 22–32)
Calcium: 9.2 mg/dL (ref 8.9–10.3)
Chloride: 101 mmol/L (ref 98–111)
Creatinine, Ser: 0.88 mg/dL (ref 0.61–1.24)
GFR, Estimated: >60 mL/min
Glucose, Bld: 98 mg/dL (ref 70–99)
Potassium: 4 mmol/L (ref 3.5–5.1)
Sodium: 138 mmol/L (ref 135–145)
Total Bilirubin: 0.6 mg/dL (ref 0.0–1.2)
Total Protein: 6.9 g/dL (ref 6.5–8.1)

## 2024-09-04 LAB — LACTIC ACID, PLASMA: Lactic Acid, Venous: 2.6 mmol/L (ref 0.5–1.9)

## 2024-09-04 LAB — RESP PANEL BY RT-PCR (RSV, FLU A&B, COVID)  RVPGX2
Influenza A by PCR: NEGATIVE
Influenza B by PCR: NEGATIVE
Resp Syncytial Virus by PCR: NEGATIVE
SARS Coronavirus 2 by RT PCR: NEGATIVE

## 2024-09-04 LAB — ETHANOL: Alcohol, Ethyl (B): <15 mg/dL

## 2024-09-04 LAB — C DIFFICILE QUICK SCREEN W PCR REFLEX
C Diff antigen: NEGATIVE
C Diff interpretation: NOT DETECTED
C Diff toxin: NEGATIVE

## 2024-09-04 LAB — PRO BRAIN NATRIURETIC PEPTIDE: Pro Brain Natriuretic Peptide: 301 pg/mL — ABNORMAL HIGH

## 2024-09-04 MED ORDER — OXYCODONE HCL 5 MG PO TABS
10.0000 mg | ORAL_TABLET | Freq: Two times a day (BID) | ORAL | Status: DC | PRN
  Administered 2024-09-04 – 2024-09-06 (×4): 10 mg via ORAL
  Filled 2024-09-04 (×4): qty 2

## 2024-09-04 MED ORDER — SODIUM CHLORIDE 0.9 % IV SOLN: INTRAVENOUS | Status: AC

## 2024-09-04 MED ORDER — ACETAMINOPHEN 650 MG RE SUPP: 650.0000 mg | Freq: Four times a day (QID) | RECTAL | Status: DC | PRN

## 2024-09-04 MED ORDER — SODIUM CHLORIDE 0.9 % IV SOLN: 2.0000 g | INTRAVENOUS | Status: DC

## 2024-09-04 MED ORDER — SODIUM CHLORIDE 0.9 % IV SOLN
2.0000 g | INTRAVENOUS | Status: DC
  Administered 2024-09-05 – 2024-09-06 (×2): 2 g via INTRAVENOUS
  Filled 2024-09-04 (×2): qty 20

## 2024-09-04 MED ORDER — AMLODIPINE BESYLATE 5 MG PO TABS
7.5000 mg | ORAL_TABLET | Freq: Every day | ORAL | Status: DC
  Administered 2024-09-04 – 2024-09-06 (×3): 7.5 mg via ORAL
  Filled 2024-09-04 (×3): qty 2

## 2024-09-04 MED ORDER — ONDANSETRON HCL 4 MG PO TABS: 4.0000 mg | ORAL_TABLET | Freq: Four times a day (QID) | ORAL | Status: DC | PRN

## 2024-09-04 MED ORDER — SODIUM CHLORIDE 0.9 % IV SOLN
500.0000 mg | Freq: Once | INTRAVENOUS | Status: AC
  Administered 2024-09-04: 500 mg via INTRAVENOUS
  Filled 2024-09-04: qty 5

## 2024-09-04 MED ORDER — IBUPROFEN 400 MG PO TABS
600.0000 mg | ORAL_TABLET | Freq: Once | ORAL | Status: AC
  Administered 2024-09-04: 600 mg via ORAL
  Filled 2024-09-04: qty 2

## 2024-09-04 MED ORDER — SODIUM CHLORIDE 0.9 % IV SOLN: 1.0000 g | INTRAVENOUS | Status: DC

## 2024-09-04 MED ORDER — ACETAMINOPHEN 325 MG PO TABS
650.0000 mg | ORAL_TABLET | Freq: Four times a day (QID) | ORAL | Status: DC | PRN
  Administered 2024-09-04 – 2024-09-05 (×2): 650 mg via ORAL
  Filled 2024-09-04 (×2): qty 2

## 2024-09-04 MED ORDER — NICOTINE 14 MG/24HR TD PT24
14.0000 mg | MEDICATED_PATCH | Freq: Every day | TRANSDERMAL | Status: DC
  Administered 2024-09-04 – 2024-09-05 (×2): 14 mg via TRANSDERMAL
  Filled 2024-09-04 (×2): qty 1

## 2024-09-04 MED ORDER — SODIUM CHLORIDE 0.9 % IV SOLN
1.0000 g | Freq: Once | INTRAVENOUS | Status: AC
  Administered 2024-09-04: 1 g via INTRAVENOUS
  Filled 2024-09-04: qty 10

## 2024-09-04 MED ORDER — AZITHROMYCIN 250 MG PO TABS
500.0000 mg | ORAL_TABLET | Freq: Every day | ORAL | Status: DC
  Administered 2024-09-05 – 2024-09-06 (×2): 500 mg via ORAL
  Filled 2024-09-04 (×2): qty 2

## 2024-09-04 MED ORDER — LACTATED RINGERS IV BOLUS
500.0000 mL | Freq: Once | INTRAVENOUS | Status: AC
  Administered 2024-09-04: 500 mL via INTRAVENOUS

## 2024-09-04 MED ORDER — METHADONE HCL 10 MG PO TABS
5.0000 mg | ORAL_TABLET | Freq: Two times a day (BID) | ORAL | Status: DC
  Administered 2024-09-04 – 2024-09-06 (×4): 5 mg via ORAL
  Filled 2024-09-04 (×4): qty 1

## 2024-09-04 MED ORDER — IPRATROPIUM-ALBUTEROL 0.5-2.5 (3) MG/3ML IN SOLN
3.0000 mL | Freq: Once | RESPIRATORY_TRACT | Status: AC
  Administered 2024-09-04: 3 mL via RESPIRATORY_TRACT
  Filled 2024-09-04: qty 3

## 2024-09-04 MED ORDER — IPRATROPIUM-ALBUTEROL 0.5-2.5 (3) MG/3ML IN SOLN: 3.0000 mL | RESPIRATORY_TRACT | Status: DC | PRN

## 2024-09-04 MED ORDER — GUAIFENESIN-DM 100-10 MG/5ML PO SYRP
15.0000 mL | ORAL_SOLUTION | Freq: Three times a day (TID) | ORAL | Status: AC
  Administered 2024-09-04 – 2024-09-05 (×3): 15 mL via ORAL
  Filled 2024-09-04 (×3): qty 15

## 2024-09-04 MED ORDER — ENOXAPARIN SODIUM 60 MG/0.6ML IJ SOSY
50.0000 mg | PREFILLED_SYRINGE | INTRAMUSCULAR | Status: DC
  Administered 2024-09-04 – 2024-09-05 (×2): 50 mg via SUBCUTANEOUS
  Filled 2024-09-04 (×2): qty 0.6

## 2024-09-04 MED ORDER — ONDANSETRON HCL 4 MG/2ML IJ SOLN: 4.0000 mg | Freq: Four times a day (QID) | INTRAMUSCULAR | Status: DC | PRN

## 2024-09-04 NOTE — ED Triage Notes (Signed)
 Pt BIB [facility] from home for c/o weakness and sob x 4 days   Pt has productive cough with yellow sputum

## 2024-09-04 NOTE — ED Notes (Signed)
 Pt ambulated in room w/ sats remaining 94% on RA. Edp made aware

## 2024-09-05 DIAGNOSIS — J189 Pneumonia, unspecified organism: Secondary | ICD-10-CM | POA: Diagnosis not present

## 2024-09-05 LAB — GASTROINTESTINAL PANEL BY PCR, STOOL (REPLACES STOOL CULTURE)

## 2024-09-05 LAB — CBC
HCT: 49 % (ref 39.0–52.0)
Hemoglobin: 16.8 g/dL (ref 13.0–17.0)
MCH: 29.8 pg (ref 26.0–34.0)
MCHC: 34.3 g/dL (ref 30.0–36.0)
MCV: 87 fL (ref 80.0–100.0)
Platelets: 290 10*3/uL (ref 150–400)
RBC: 5.63 MIL/uL (ref 4.22–5.81)
RDW: 12.6 % (ref 11.5–15.5)
WBC: 14.4 10*3/uL — ABNORMAL HIGH (ref 4.0–10.5)
nRBC: 0 % (ref 0.0–0.2)

## 2024-09-05 LAB — RESPIRATORY PANEL BY PCR

## 2024-09-05 LAB — BASIC METABOLIC PANEL WITH GFR
Anion gap: 15 (ref 5–15)
BUN: 11 mg/dL (ref 6–20)
CO2: 19 mmol/L — ABNORMAL LOW (ref 22–32)
Calcium: 8.8 mg/dL — ABNORMAL LOW (ref 8.9–10.3)
Chloride: 104 mmol/L (ref 98–111)
Creatinine, Ser: 0.6 mg/dL — ABNORMAL LOW (ref 0.61–1.24)
GFR, Estimated: >60 mL/min
Glucose, Bld: 78 mg/dL (ref 70–99)
Potassium: 3.4 mmol/L — ABNORMAL LOW (ref 3.5–5.1)
Sodium: 139 mmol/L (ref 135–145)

## 2024-09-05 LAB — PROCALCITONIN: Procalcitonin: <0.1 ng/mL

## 2024-09-05 LAB — MAGNESIUM: Magnesium: 2.3 mg/dL (ref 1.7–2.4)

## 2024-09-05 LAB — HIV ANTIBODY (ROUTINE TESTING W REFLEX): HIV Screen 4th Generation wRfx: NONREACTIVE

## 2024-09-05 LAB — LACTIC ACID, PLASMA: Lactic Acid, Venous: 2 mmol/L (ref 0.5–1.9)

## 2024-09-05 MED ORDER — POTASSIUM CHLORIDE CRYS ER 20 MEQ PO TBCR
40.0000 meq | EXTENDED_RELEASE_TABLET | Freq: Once | ORAL | Status: AC
  Administered 2024-09-05: 40 meq via ORAL
  Filled 2024-09-05: qty 2

## 2024-09-05 NOTE — Progress Notes (Signed)
 " PROGRESS NOTE    [name] [name]  FMW:[id number] DOB: 03-25-1966 DOA: 09/04/2024 PCP: Carlette Benita Area, MD  Subjective: No acute events overnight. Seen and examined at bedside. Reports still feeling weak and generalized malaise. Has not ambulated since admission as he feels like he will be dizzy when he gets up. Denies nausea or vomiting.   Hospital Course:  59 y.o. male with medical history significant for  COPD, BPD, coronary artery disease, hypertension, chronic pain. Patient presented to the ED with complaints of generalized weakness, difficulty breathing and cough productive of yellowish sputum over the past 4 days.  He is able to ambulate but just feels generally weak.  No chest pain.  No lower extremity swelling.  Reports 4-5 watery stools daily over the past 4 to 5 days.  Denies recent antibiotic use.  Associated vomiting no abdominal pain.  No fevers or chills.  Admitted for sepsis in the setting of community acquired pneumonia, diarrhea and acute debilitation.   Assessment and Plan:  Sepsis Pneumonia Presented with productive cough, dyspnea, generalized weakness.  Covid/influenza/RSV negative Procalcitonin not elevated Chest  X-ray with minimal atelectasis or infiltrate in the peripheral left base.  Not hypoxic. - status post IV fluids -Continue IV ceftriaxone  and azithromycin  - blood cultures with NGTD, final read pending - will check resp viral panel   Lactic acidosis - in the setting of sepsis with/without diarrhea - lactate 2 < 2.6 - improved with IV fluids - encourage oral intake - sepsis management as elsewhere - monitor clinically  Diarrhea - over the past 3 to 4 days, averaging 5 episodes daily of watery stools.  No recent antibiotics. - etiology unclear, maybe  - Stool C. Difficile negative - GI pathogen panel pending - status post IV fluids - encourage oral intake/hydration - consider loperamide PRN for symptomatic relief if having frequent loose  stools   Hypertension - elevated systolic 160s to 829d. -Norvasc , metoprolol  listed on med list, he is not taking them  - Cont norvasc  7.5 mg daily   Chronic pain - cont methadone , oxycodone  10 mg 3 times daily as needed   COPD - stable. - DuoNebs as needed   Tobacco abuse Cannabinoid use Benzodiazepine use -1 pack of cigarettes daily. - Utox positive for THC and benzodiazepines - Nicotine  patch - monitor clinically   Bipolar disorder  - haloperidol  on med list. He is not taking this. - monitor clinically  Acute debilitation - will get PT eval  DVT prophylaxis:   Lovenox    Code Status: Full Code  Disposition Plan: TBD Reason for continuing need for hospitalization: IV antibiotics, monitor for clinical improvement  Objective: Vitals:   09/04/24 1730 09/04/24 2104 09/05/24 0112 09/05/24 0511  BP: (!) 181/110 (!) 147/112 136/81 (!) 148/99  Pulse: 85 93 87 83  Resp: 18  18   Temp: 98.5 F (36.9 C) 97.8 F (36.6 C) 99 F (37.2 C) 98.6 F (37 C)  TempSrc: Oral Oral Oral Oral  SpO2: 97% 95% 93% 92%  Weight:      Height:        Intake/Output Summary (Last 24 hours) at 09/05/2024 1229 Last data filed at 09/05/2024 1025 Gross per 24 hour  Intake 120 ml  Output 800 ml  Net -680 ml   Filed Weights   09/04/24 0710  Weight: 104.3 kg    Examination:  Physical Exam Vitals and nursing note reviewed.  Constitutional:      General: He is not in acute distress.  Appearance: He is ill-appearing.     Comments: Weak, frail  HENT:     Head: Normocephalic and atraumatic.  Cardiovascular:     Rate and Rhythm: Normal rate and regular rhythm.     Pulses: Normal pulses.     Heart sounds: Normal heart sounds.  Pulmonary:     Effort: Pulmonary effort is normal. No respiratory distress.     Breath sounds: Normal breath sounds. No wheezing.  Abdominal:     General: Bowel sounds are normal. There is no distension.     Palpations: Abdomen is soft.     Tenderness:  There is no abdominal tenderness.  Musculoskeletal:     Right lower leg: No edema.     Left lower leg: No edema.  Neurological:     Mental Status: He is alert.     Data Reviewed: I have personally reviewed following labs and imaging studies  CBC: Recent Labs  Lab 09/04/24 0733 09/05/24 0407  WBC 15.3* 14.4*  NEUTROABS 10.7*  --   HGB 16.7 16.8  HCT 50.0 49.0  MCV 86.8 87.0  PLT 296 290   Basic Metabolic Panel: Recent Labs  Lab 09/04/24 0733 09/05/24 0407 09/05/24 1024  NA 138 139  --   K 4.0 3.4*  --   CL 101 104  --   CO2 26 19*  --   GLUCOSE 98 78  --   BUN 11 11  --   CREATININE 0.88 0.60*  --   CALCIUM  9.2 8.8*  --   MG  --   --  2.3   GFR: Estimated Creatinine Clearance: 115.9 mL/min (A) (by C-G formula based on SCr of 0.6 mg/dL (L)). Liver Function Tests: Recent Labs  Lab 09/04/24 0733  AST 13*  ALT 12  ALKPHOS 107  BILITOT 0.6  PROT 6.9  ALBUMIN 4.2   No results for input(s): LIPASE, AMYLASE in the last 168 hours. No results for input(s): AMMONIA in the last 168 hours. Coagulation Profile: No results for input(s): INR, PROTIME in the last 168 hours. Cardiac Enzymes: No results for input(s): CKTOTAL, CKMB, CKMBINDEX, TROPONINI in the last 168 hours. ProBNP, BNP (last 5 results) Recent Labs    09/04/24 0733  PROBNP 301.0*   HbA1C: No results for input(s): HGBA1C in the last 72 hours. CBG: No results for input(s): GLUCAP in the last 168 hours. Lipid Profile: No results for input(s): CHOL, HDL, LDLCALC, TRIG, CHOLHDL, LDLDIRECT in the last 72 hours. Thyroid  Function Tests: No results for input(s): TSH, T4TOTAL, FREET4, T3FREE, THYROIDAB in the last 72 hours. Anemia Panel: No results for input(s): VITAMINB12, FOLATE, FERRITIN, TIBC, IRON, RETICCTPCT in the last 72 hours. Sepsis Labs: Recent Labs  Lab 09/04/24 1917 09/05/24 0407 09/05/24 1024  PROCALCITON  --  <0.10  --    LATICACIDVEN 2.6*  --  2.0*    Recent Results (from the past 240 hours)  Resp panel by RT-PCR (RSV, Flu A&B, Covid) Anterior Nasal Swab     Status: None   Collection Time: 09/04/24  8:18 AM   Specimen: Anterior Nasal Swab  Result Value Ref Range Status   SARS Coronavirus 2 by RT PCR NEGATIVE NEGATIVE Final    Comment: (NOTE) SARS-CoV-2 target nucleic acids are NOT DETECTED.  The SARS-CoV-2 RNA is generally detectable in upper respiratory specimens during the acute phase of infection. The lowest concentration of SARS-CoV-2 viral copies this assay can detect is 138 copies/mL. A negative result does not preclude SARS-Cov-2 infection and should  not be used as the sole basis for treatment or other patient management decisions. A negative result may occur with  improper specimen collection/handling, submission of specimen other than nasopharyngeal swab, presence of viral mutation(s) within the areas targeted by this assay, and inadequate number of viral copies(<138 copies/mL). A negative result must be combined with clinical observations, patient history, and epidemiological information. The expected result is Negative.  Fact Sheet for Patients:  bloggercourse.com  Fact Sheet for Healthcare Providers:  seriousbroker.it  This test is no t yet approved or cleared by the [place]  FDA and  has been authorized for detection and/or diagnosis of SARS-CoV-2 by FDA under an Emergency Use Authorization (EUA). This EUA will remain  in effect (meaning this test can be used) for the duration of the COVID-19 declaration under Section 564(b)(1) of the Act, 21 U.S.C.section 360bbb-3(b)(1), unless the authorization is terminated  or revoked sooner.       Influenza A by PCR NEGATIVE NEGATIVE Final   Influenza B by PCR NEGATIVE NEGATIVE Final    Comment: (NOTE) The Xpert Xpress SARS-CoV-2/FLU/RSV plus assay is intended as an aid in the  diagnosis of influenza from Nasopharyngeal swab specimens and should not be used as a sole basis for treatment. Nasal washings and aspirates are unacceptable for Xpert Xpress SARS-CoV-2/FLU/RSV testing.  Fact Sheet for Patients: bloggercourse.com  Fact Sheet for Healthcare Providers: seriousbroker.it  This test is not yet approved or cleared by the [place]  FDA and has been authorized for detection and/or diagnosis of SARS-CoV-2 by FDA under an Emergency Use Authorization (EUA). This EUA will remain in effect (meaning this test can be used) for the duration of the COVID-19 declaration under Section 564(b)(1) of the Act, 21 U.S.C. section 360bbb-3(b)(1), unless the authorization is terminated or revoked.     Resp Syncytial Virus by PCR NEGATIVE NEGATIVE Final    Comment: (NOTE) Fact Sheet for Patients: bloggercourse.com  Fact Sheet for Healthcare Providers: seriousbroker.it  This test is not yet approved or cleared by the [place]  FDA and has been authorized for detection and/or diagnosis of SARS-CoV-2 by FDA under an Emergency Use Authorization (EUA). This EUA will remain in effect (meaning this test can be used) for the duration of the COVID-19 declaration under Section 564(b)(1) of the Act, 21 U.S.C. section 360bbb-3(b)(1), unless the authorization is terminated or revoked.  Performed at [facility], [address] [place], [place] [address]   Culture, blood (Routine X 2) w Reflex to ID Panel     Status: None (Preliminary result)   Collection Time: 09/04/24  7:17 PM   Specimen: BLOOD  Result Value Ref Range Status   Specimen Description BLOOD RIGHT ANTECUBITAL  Final   Special Requests AEROBIC BOTTLE ONLY Blood Culture adequate volume  Final   Culture   Final    NO GROWTH < 12 HOURS Performed at [facility], [address], [place], [place] [address]    Report  Status PENDING  Incomplete  Culture, blood (Routine X 2) w Reflex to ID Panel     Status: None (Preliminary result)   Collection Time: 09/04/24  7:17 PM   Specimen: BLOOD  Result Value Ref Range Status   Specimen Description BLOOD BLOOD RIGHT WRIST  Final   Special Requests AEROBIC BOTTLE ONLY Blood Culture adequate volume  Final   Culture   Final    NO GROWTH < 12 HOURS Performed at [facility], [address] [place], [place] [address]    Report Status PENDING  Incomplete  C Difficile Quick Screen w PCR reflex     Status: None   Collection Time: 09/04/24  9:05 PM   Specimen: STOOL  Result Value Ref Range Status   C Diff antigen NEGATIVE NEGATIVE Final   C Diff toxin NEGATIVE NEGATIVE Final   C Diff interpretation No C. difficile detected.  Final    Comment: Performed at [facility], [address] [place], [place] [address]     Radiology Studies: CT Head Wo Contrast Result Date: 09/04/2024 EXAM: CT HEAD WITHOUT CONTRAST 09/04/2024 05:11:09 PM TECHNIQUE: CT of the head was performed without the administration of intravenous contrast. Automated exposure control, iterative reconstruction, and/or weight based adjustment of the mA/kV was utilized to reduce the radiation dose to as low as reasonably achievable. COMPARISON: CT head dated 12/22/2009. CLINICAL HISTORY: Mental status change, unknown cause. FINDINGS: BRAIN AND VENTRICLES: No acute hemorrhage. No evidence of acute territorial infarct. No hydrocephalus. No extra-axial collection. No mass effect or midline shift. There is overall mild scattered white matter hypodensities which are nonspecific but most commonly represent chronic microvascular ischemic changes. Developmental venous anomaly right cerebellum. ORBITS: Bilateral lens replacement. SINUSES: No acute abnormality. SOFT TISSUES AND SKULL: No acute soft tissue abnormality. No skull fracture. IMPRESSION: 1. No acute intracranial abnormality. Electronically signed by: Prentice Spade  MD 09/04/2024 05:32 PM EST RP Workstation: [id number]   DG Chest Portable 1 View Result Date: 09/04/2024 CLINICAL DATA:  Cough and shortness of breath. EXAM: PORTABLE CHEST 1 VIEW COMPARISON:  12/28/2022 FINDINGS: Right lung clear. Minimal atelectasis or infiltrate in the peripheral left base. No pulmonary edema or pleural effusion. The cardiopericardial silhouette is within normal limits for size. Telemetry leads overlie the chest. Chronic posttraumatic deformity left clavicle. IMPRESSION: Minimal atelectasis or infiltrate in the peripheral left base. Electronically Signed   By: Camellia Candle M.D.   On: 09/04/2024 07:36    Scheduled Meds:  amLODipine   7.5 mg Oral Daily   azithromycin   500 mg Oral Daily   enoxaparin  (LOVENOX ) injection  50 mg Subcutaneous Q24H   guaiFENesin -dextromethorphan   15 mL Oral Q8H   methadone   5 mg Oral Q12H   nicotine   14 mg Transdermal Daily   Continuous Infusions:  cefTRIAXone  (ROCEPHIN )  IV 2 g (09/05/24 0803)     LOS: 1 day   Norval Bar, MD  [facility]  09/05/2024, 12:29 PM   "

## 2024-09-05 NOTE — Plan of Care (Signed)

## 2024-09-05 NOTE — Progress Notes (Signed)
" °   09/04/24 2200  Provider Notification  Provider Name/Title Lynwood Glisson  Date Provider Notified 09/04/24  Time Provider Notified 2200  Notification Reason Critical Result  Test performed and critical result lactic acid-2.6  Date Critical Result Received 09/04/24  Time Critical Result Received 2154  Provider response See new orders  Date of Provider Response 09/04/24  Time of Provider Response 2200    "

## 2024-09-05 NOTE — Progress Notes (Signed)
 Pt screened.  He is ambulatory without assistive device and does not feel he needs skilled PT at this time.  Pt will be referred to mobility tech while he is in the hospital.  Montie Metro, PT CLT [phone]

## 2024-09-05 NOTE — TOC Initial Note (Signed)
 Transition of Care [facility]) - Initial/Assessment Note    Patient Details  Name: [name] [name] MRN: [id number] Date of Birth: 1965/10/16  [facility] CM/SW Contact:    Lucie Lunger, LCSWA Phone Number: 09/05/2024, 10:39 AM  Clinical Narrative:                 Pt is high risk for readmission. CSW spoke with pt at bedside to complete assessment. Pt states that he lives alone and is independent in completing his ADLs. Pt states that he uses [employer] for transportation to appointments when needed. Pt has not had HH in the past. Pt states he has a cane to use if needed. TOC to follow.   Expected Discharge Plan: Home/Self Care Barriers to Discharge: Continued Medical Work up   Patient Goals and CMS Choice Patient states their goals for this hospitalization and ongoing recovery are:: return home CMS Medicare.gov Compare Post Acute Care list provided to:: Patient Choice offered to / list presented to : Patient      Expected Discharge Plan and Services In-house Referral: Clinical Social Work Discharge Planning Services: CM Consult   Living arrangements for the past 2 months: Single Family Home                                      Prior Living Arrangements/Services Living arrangements for the past 2 months: Single Family Home Lives with:: Self Patient language and need for interpreter reviewed:: Yes Do you feel safe going back to the place where you live?: Yes      Need for Family Participation in Patient Care: Yes (Comment) Care giver support system in place?: Yes (comment)   Criminal Activity/Legal Involvement Pertinent to Current Situation/Hospitalization: No - Comment as needed  Activities of Daily Living   ADL Screening (condition at time of admission) Independently performs ADLs?: Yes (appropriate for developmental age) Is the patient deaf or have difficulty hearing?: No Does the patient have difficulty seeing, even when wearing glasses/contacts?:  No Does the patient have difficulty concentrating, remembering, or making decisions?: No  Permission Sought/Granted                  Emotional Assessment Appearance:: Appears stated age Attitude/Demeanor/Rapport: Engaged Affect (typically observed): Accepting Orientation: : Oriented to Self, Oriented to Place, Oriented to  Time, Oriented to Situation Alcohol  / Substance Use: Not Applicable Psych Involvement: No (comment)  Admission diagnosis:  Generalized weakness [R53.1] PNA (pneumonia) [J18.9] Pneumonia of left lower lobe due to infectious organism [J18.9] Patient Active Problem List   Diagnosis Date Noted   PNA (pneumonia) 09/04/2024   Bipolar 1 disorder, severe, current or most recent episode manic, with psychotic features (HCC) 07/24/2024   Chronic back pain 07/24/2024   Bipolar 1 disorder (HCC) 07/23/2024   Gastritis 12/30/2022   Acute blood loss anemia 12/30/2022   Coronary artery disease involving native coronary artery of native heart with unstable angina pectoris (HCC) 12/29/2022   Status post coronary artery stent placement 12/29/2022   Acute upper GI bleeding 12/29/2022   ST elevation myocardial infarction (STEMI) (HCC) 12/29/2022   Hematemesis with nausea 12/29/2022   Antiplatelet or antithrombotic long-term use 12/29/2022   Acute ST elevation myocardial infarction (STEMI) of anterior wall (HCC) 12/28/2022   Substance-induced psychotic disorder with hallucinations (HCC) 08/25/2022   DOE (dyspnea on exertion) 10/11/2019   COPD GOLD 0 10/11/2019   Essential hypertension 10/11/2019  Morbid obesity due to excess calories (HCC) 10/11/2019   Esophageal dysphagia 01/03/2017   Encounter for screening colonoscopy 01/03/2017   Substance induced mood disorder (HCC) 09/20/2016   Hyperprolactinemia 03/14/2015   Opiate dependence, continuous (HCC) 03/12/2015   Cocaine use disorder, moderate, in sustained remission (HCC) 03/12/2015   Acute renal failure 02/17/2012    Rhabdomyolysis 02/13/2012   OVERWEIGHT 01/24/2009   ARACHNOIDITIS 09/27/2008   MALAISE AND FATIGUE 09/27/2008   Hyperlipidemia with target LDL less than 70 07/01/2008   BIPOLAR AFFECTIVE DISORDER 07/01/2008   MIGRAINE HEADACHE 07/01/2008   GERD (gastroesophageal reflux disease) 07/01/2008   ARTHRITIS 07/01/2008   DEGENERATIVE DISC DISEASE 07/01/2008   LOW BACK PAIN, CHRONIC 07/01/2008   PCP:  Carlette Benita Area, MD Pharmacy:   [facility] [phone] - [place], [place] - [address] [place] [address] Phone: [phone] Fax: [phone]     Social Drivers of Health (SDOH) Social History: SDOH Screenings   Food Insecurity: No Food Insecurity (09/04/2024)  Housing: Low Risk (09/04/2024)  Transportation Needs: No Transportation Needs (09/04/2024)  Recent Concern: Transportation Needs - Unmet Transportation Needs (07/23/2024)  Utilities: Not At Risk (09/04/2024)  Alcohol  Screen: Low Risk (07/23/2024)  Depression (PHQ2-9): Low Risk (03/11/2022)  Financial Resource Strain: Low Risk (03/11/2022)  Physical Activity: Inactive (03/11/2022)  Social Connections: Moderately Isolated (03/11/2022)  Stress: No Stress Concern Present (03/11/2022)  Tobacco Use: High Risk (09/04/2024)   SDOH Interventions:     Readmission Risk Interventions    09/05/2024   10:38 AM  Readmission Risk Prevention Plan  Transportation Screening Complete  HRI or Home Care Consult Complete  Social Work Consult for Recovery Care Planning/Counseling Complete  Palliative Care Screening Not Applicable  Medication Review Oceanographer) Complete

## 2024-09-06 DIAGNOSIS — J189 Pneumonia, unspecified organism: Secondary | ICD-10-CM | POA: Diagnosis not present

## 2024-09-06 LAB — BASIC METABOLIC PANEL WITH GFR
Anion gap: 16 — ABNORMAL HIGH (ref 5–15)
BUN: 14 mg/dL (ref 6–20)
CO2: 21 mmol/L — ABNORMAL LOW (ref 22–32)
Calcium: 8.9 mg/dL (ref 8.9–10.3)
Chloride: 103 mmol/L (ref 98–111)
Creatinine, Ser: 0.72 mg/dL (ref 0.61–1.24)
GFR, Estimated: >60 mL/min
Glucose, Bld: 81 mg/dL (ref 70–99)
Potassium: 3.5 mmol/L (ref 3.5–5.1)
Sodium: 140 mmol/L (ref 135–145)

## 2024-09-06 MED ORDER — POTASSIUM CHLORIDE CRYS ER 20 MEQ PO TBCR
40.0000 meq | EXTENDED_RELEASE_TABLET | Freq: Once | ORAL | Status: AC
  Administered 2024-09-06: 40 meq via ORAL
  Filled 2024-09-06: qty 2

## 2024-09-06 MED ORDER — CEFPODOXIME PROXETIL 200 MG PO TABS: 200.0000 mg | ORAL_TABLET | Freq: Two times a day (BID) | ORAL | 0 refills | Status: AC

## 2024-09-06 MED ORDER — NICOTINE 14 MG/24HR TD PT24: 14.0000 mg | MEDICATED_PATCH | Freq: Every day | TRANSDERMAL | 0 refills | Status: AC

## 2024-09-06 NOTE — Discharge Summary (Signed)
 " [facility] Physician Discharge Summary   Patient name: [name] [name]  Admit date:     09/04/2024  Discharge date: 09/06/2024  Attending Physician: EMOKPAE, EJIROGHENE E [[id number]  Discharge Physician: Norval Bar   PCP: Fanta, Tesfaye Demissie, MD  Admitted From: Home  Disposition:  Home  Recommendations for Outpatient Follow-up:  Follow up with PCP in 1-2 weeks Finish 5 days total of antibiotics for pneumonia  Home Health:No Equipment/Devices: @ECDMELIST @  Discharge Condition:Stable CODE STATUS:FULL Diet recommendation: Heart Healthy Fluid Restriction: None  Hospital Summary:  59 y.o. male with medical history significant for  COPD, BPD, coronary artery disease, hypertension, chronic pain. Patient presented to the ED with complaints of generalized weakness, difficulty breathing and cough productive of yellowish sputum over the past 4 days.  He is able to ambulate but just feels generally weak.  No chest pain.  No lower extremity swelling.  Reports 4-5 watery stools daily over the past 4 to 5 days.  Denies recent antibiotic use.  Associated vomiting no abdominal pain.  No fevers or chills. Admitted for sepsis in the setting of community acquired pneumonia, diarrhea and acute debilitation. Treated with 3 days of azithromycin  500mg  and ceftriaxone  which was transitioned to cefpodoxime  to finish 5 days total course. Generalized weakness improved with conservative care and antibiotics and patient states he is walking independently at this time. Stable for discharge home to follow up with PCP within one week.   Of note, patient reports he is not taking any psychiatry medications and doesn't follow with a psychiatry as he doesn't believe he has any psychiatric disorder despite documented bipolar disorder in the chart. Patient has not voiced any homicidal or suicidal ideations during this hospital stay. Recommended that patient follow with PCP for ongoing care as needed.    Discharge  Diagnoses:  Principal Problem:   PNA (pneumonia) Active Problems:   Coronary artery disease involving native coronary artery of native heart with unstable angina pectoris (HCC)   LOW BACK PAIN, CHRONIC   Opiate dependence, continuous (HCC)   COPD GOLD 0   Essential hypertension   Bipolar 1 disorder [facility])   Discharge Instructions  Discharge Instructions     Increase activity slowly   Complete by: As directed       Allergies as of 09/06/2024       Reactions   No Known Allergies         Medication List     STOP taking these medications    ibuprofen  200 MG tablet Commonly known as: ADVIL    metoprolol  tartrate 25 MG tablet Commonly known as: LOPRESSOR        TAKE these medications    benztropine  1 MG tablet Commonly known as: COGENTIN  Take 1 tablet (1 mg total) by mouth 2 (two) times daily. The timing of this medication is very important.   amLODipine  2.5 MG tablet Commonly known as: NORVASC  Take 3 tablets (7.5 mg total) by mouth daily.   cefpodoxime  200 MG tablet Commonly known as: VANTIN  Take 1 tablet (200 mg total) by mouth 2 (two) times daily for 2 days.   haloperidol  10 MG tablet Commonly known as: HALDOL  Take 1 tablet (10 mg total) by mouth at bedtime.   haloperidol  decanoate 100 MG/ML injection Commonly known as: HALDOL  DECANOATE Inject 1.5 mLs (150 mg total) into the muscle every 30 (thirty) days.   methadone  5 MG tablet Commonly known as: DOLOPHINE  Take 5 mg by mouth every 12 (twelve) hours.   naloxone  4 MG/0.1ML Liqd  nasal spray kit Commonly known as: NARCAN  SMARTSIG:Spray(s) Both Nares   nicotine  14 mg/24hr patch Commonly known as: NICODERM CQ  - dosed in mg/24 hours Place 1 patch (14 mg total) onto the skin daily for 10 days.   Oxycodone  HCl 10 MG Tabs Take 1 tablet (10 mg total) by mouth 2 (two) times daily as needed for breakthrough pain.   traZODone  50 MG tablet Commonly known as: DESYREL  Take 1 tablet (50 mg total) by mouth at  bedtime as needed for sleep.   Vitamin D  (Ergocalciferol ) 1.25 MG (50000 UNIT) Caps capsule Commonly known as: DRISDOL  Take 1 capsule (50,000 Units total) by mouth every 7 (seven) days.        Allergies[1]  Discharge Exam: Vitals:   09/05/24 1949 09/06/24 0415  BP: (!) 141/99 (!) 156/102  Pulse: 84 80  Resp: 18   Temp: 98.3 F (36.8 C) 98.1 F (36.7 C)  SpO2: 95% 93%    Physical Exam Vitals and nursing note reviewed.  Constitutional:      General: He is not in acute distress.    Appearance: He is not ill-appearing.  HENT:     Head: Normocephalic and atraumatic.  Cardiovascular:     Rate and Rhythm: Normal rate and regular rhythm.     Pulses: Normal pulses.     Heart sounds: Normal heart sounds.  Pulmonary:     Effort: Pulmonary effort is normal.     Breath sounds: Normal breath sounds.  Abdominal:     General: Bowel sounds are normal.     Palpations: Abdomen is soft.  Neurological:     Mental Status: He is alert. Mental status is at baseline.  Psychiatric:        Mood and Affect: Mood normal.        Behavior: Behavior normal.     The results of significant diagnostics from this hospitalization (including imaging, microbiology, ancillary and laboratory) are listed below for reference.    Microbiology: Recent Results (from the past 240 hours)  Resp panel by RT-PCR (RSV, Flu A&B, Covid) Anterior Nasal Swab     Status: None   Collection Time: 09/04/24  8:18 AM   Specimen: Anterior Nasal Swab  Result Value Ref Range Status   SARS Coronavirus 2 by RT PCR NEGATIVE NEGATIVE Final    Comment: (NOTE) SARS-CoV-2 target nucleic acids are NOT DETECTED.  The SARS-CoV-2 RNA is generally detectable in upper respiratory specimens during the acute phase of infection. The lowest concentration of SARS-CoV-2 viral copies this assay can detect is 138 copies/mL. A negative result does not preclude SARS-Cov-2 infection and should not be used as the sole basis for treatment  or other patient management decisions. A negative result may occur with  improper specimen collection/handling, submission of specimen other than nasopharyngeal swab, presence of viral mutation(s) within the areas targeted by this assay, and inadequate number of viral copies(<138 copies/mL). A negative result must be combined with clinical observations, patient history, and epidemiological information. The expected result is Negative.  Fact Sheet for Patients:  bloggercourse.com  Fact Sheet for Healthcare Providers:  seriousbroker.it  This test is no t yet approved or cleared by the [place]  FDA and  has been authorized for detection and/or diagnosis of SARS-CoV-2 by FDA under an Emergency Use Authorization (EUA). This EUA will remain  in effect (meaning this test can be used) for the duration of the COVID-19 declaration under Section 564(b)(1) of the Act, 21 U.S.C.section 360bbb-3(b)(1), unless the authorization is terminated  or revoked sooner.       Influenza A by PCR NEGATIVE NEGATIVE Final   Influenza B by PCR NEGATIVE NEGATIVE Final    Comment: (NOTE) The Xpert Xpress SARS-CoV-2/FLU/RSV plus assay is intended as an aid in the diagnosis of influenza from Nasopharyngeal swab specimens and should not be used as a sole basis for treatment. Nasal washings and aspirates are unacceptable for Xpert Xpress SARS-CoV-2/FLU/RSV testing.  Fact Sheet for Patients: bloggercourse.com  Fact Sheet for Healthcare Providers: seriousbroker.it  This test is not yet approved or cleared by the [place]  FDA and has been authorized for detection and/or diagnosis of SARS-CoV-2 by FDA under an Emergency Use Authorization (EUA). This EUA will remain in effect (meaning this test can be used) for the duration of the COVID-19 declaration under Section 564(b)(1) of the Act, 21 U.S.C. section  360bbb-3(b)(1), unless the authorization is terminated or revoked.     Resp Syncytial Virus by PCR NEGATIVE NEGATIVE Final    Comment: (NOTE) Fact Sheet for Patients: bloggercourse.com  Fact Sheet for Healthcare Providers: seriousbroker.it  This test is not yet approved or cleared by the [place]  FDA and has been authorized for detection and/or diagnosis of SARS-CoV-2 by FDA under an Emergency Use Authorization (EUA). This EUA will remain in effect (meaning this test can be used) for the duration of the COVID-19 declaration under Section 564(b)(1) of the Act, 21 U.S.C. section 360bbb-3(b)(1), unless the authorization is terminated or revoked.  Performed at [facility], [address], [place], [place] [address]   Culture, blood (Routine X 2) w Reflex to ID Panel     Status: None (Preliminary result)   Collection Time: 09/04/24  7:17 PM   Specimen: BLOOD  Result Value Ref Range Status   Specimen Description BLOOD RIGHT ANTECUBITAL  Final   Special Requests AEROBIC BOTTLE ONLY Blood Culture adequate volume  Final   Culture   Final    NO GROWTH 2 DAYS Performed at [facility], [address] [place], [place] [address]    Report Status PENDING  Incomplete  Culture, blood (Routine X 2) w Reflex to ID Panel     Status: None (Preliminary result)   Collection Time: 09/04/24  7:17 PM   Specimen: BLOOD  Result Value Ref Range Status   Specimen Description BLOOD BLOOD RIGHT WRIST  Final   Special Requests AEROBIC BOTTLE ONLY Blood Culture adequate volume  Final   Culture   Final    NO GROWTH 2 DAYS Performed at [facility], [address], [place], [place] [address]    Report Status PENDING  Incomplete  C Difficile Quick Screen w PCR reflex     Status: None   Collection Time: 09/04/24  9:05 PM   Specimen: STOOL  Result Value Ref Range Status   C Diff antigen NEGATIVE NEGATIVE Final   C Diff toxin NEGATIVE NEGATIVE Final   C  Diff interpretation No C. difficile detected.  Final    Comment: Performed at [facility], [address] [place], [place] [address]  Gastrointestinal Panel by PCR , Stool     Status: None   Collection Time: 09/04/24  9:05 PM   Specimen: Stool  Result Value Ref Range Status   Campylobacter species NOT DETECTED NOT DETECTED Final   Plesimonas shigelloides NOT DETECTED NOT DETECTED Final   Salmonella species NOT DETECTED NOT DETECTED Final   Yersinia enterocolitica NOT DETECTED NOT DETECTED Final   Vibrio species NOT DETECTED NOT DETECTED Final  Vibrio cholerae NOT DETECTED NOT DETECTED Final   Enteroaggregative E coli (EAEC) NOT DETECTED NOT DETECTED Final   Enteropathogenic E coli (EPEC) NOT DETECTED NOT DETECTED Final   Enterotoxigenic E coli (ETEC) NOT DETECTED NOT DETECTED Final   Shiga like toxin producing E coli (STEC) NOT DETECTED NOT DETECTED Final   Shigella/Enteroinvasive E coli (EIEC) NOT DETECTED NOT DETECTED Final   Cryptosporidium NOT DETECTED NOT DETECTED Final   Cyclospora cayetanensis NOT DETECTED NOT DETECTED Final   Entamoeba histolytica NOT DETECTED NOT DETECTED Final   Giardia lamblia NOT DETECTED NOT DETECTED Final   Adenovirus F40/41 NOT DETECTED NOT DETECTED Final   Astrovirus NOT DETECTED NOT DETECTED Final   Norovirus GI/GII NOT DETECTED NOT DETECTED Final   Rotavirus A NOT DETECTED NOT DETECTED Final   Sapovirus (I, II, IV, and V) NOT DETECTED NOT DETECTED Final    Comment: Performed at [facility], [address] [place], [place] [address]  Respiratory (~20 pathogens) panel by PCR     Status: None   Collection Time: 09/05/24  1:30 PM   Specimen: Nasopharyngeal Swab; Respiratory  Result Value Ref Range Status   Adenovirus NOT DETECTED NOT DETECTED Final   Coronavirus 229E NOT DETECTED NOT DETECTED Final    Comment: (NOTE) The Coronavirus on the Respiratory Panel, DOES NOT test for the novel  Coronavirus (2019 nCoV)    Coronavirus HKU1 NOT  DETECTED NOT DETECTED Final   Coronavirus NL63 NOT DETECTED NOT DETECTED Final   Coronavirus OC43 NOT DETECTED NOT DETECTED Final   Metapneumovirus NOT DETECTED NOT DETECTED Final   Rhinovirus / Enterovirus NOT DETECTED NOT DETECTED Final   Influenza A NOT DETECTED NOT DETECTED Final   Influenza B NOT DETECTED NOT DETECTED Final   Parainfluenza Virus 1 NOT DETECTED NOT DETECTED Final   Parainfluenza Virus 2 NOT DETECTED NOT DETECTED Final   Parainfluenza Virus 3 NOT DETECTED NOT DETECTED Final   Parainfluenza Virus 4 NOT DETECTED NOT DETECTED Final   Respiratory Syncytial Virus NOT DETECTED NOT DETECTED Final   Bordetella pertussis NOT DETECTED NOT DETECTED Final   Bordetella Parapertussis NOT DETECTED NOT DETECTED Final   Chlamydophila pneumoniae NOT DETECTED NOT DETECTED Final   Mycoplasma pneumoniae NOT DETECTED NOT DETECTED Final    Comment: Performed at [facility], [address], [place], [place] [address]     Labs: ProBNP, BNP (last 5 results) Recent Labs    09/04/24 0733  PROBNP 301.0*   Basic Metabolic Panel: Recent Labs  Lab 09/04/24 0733 09/05/24 0407 09/05/24 1024 09/06/24 0408  NA 138 139  --  140  K 4.0 3.4*  --  3.5  CL 101 104  --  103  CO2 26 19*  --  21*  GLUCOSE 98 78  --  81  BUN 11 11  --  14  CREATININE 0.88 0.60*  --  0.72  CALCIUM  9.2 8.8*  --  8.9  MG  --   --  2.3  --    Liver Function Tests: Recent Labs  Lab 09/04/24 0733  AST 13*  ALT 12  ALKPHOS 107  BILITOT 0.6  PROT 6.9  ALBUMIN 4.2   No results for input(s): LIPASE, AMYLASE in the last 168 hours. No results for input(s): AMMONIA in the last 168 hours. CBC: Recent Labs  Lab 09/04/24 0733 09/05/24 0407  WBC 15.3* 14.4*  NEUTROABS 10.7*  --   HGB 16.7 16.8  HCT 50.0 49.0  MCV 86.8 87.0  PLT 296 290   Cardiac Enzymes: No results for input(s): CKTOTAL, CKMB, CKMBINDEX, TROPONINI, TROPONINIHS in the last 168 hours. BNP: No results for input(s):  BNP in the last 168 hours. CBG: No results for input(s): GLUCAP in the last 168 hours. D-Dimer No results for input(s): DDIMER in the last 72 hours. Hgb A1c No results for input(s): HGBA1C in the last 72 hours. Lipid Profile No results for input(s): CHOL, HDL, LDLCALC, TRIG, CHOLHDL, LDLDIRECT in the last 72 hours. Thyroid  function studies No results for input(s): TSH, T4TOTAL, FREET4, T3FREE, THYROIDAB in the last 72 hours.  Invalid input(s): FREET3 Anemia work up No results for input(s): VITAMINB12, FOLATE, FERRITIN, TIBC, IRON, RETICCTPCT in the last 72 hours. Urinalysis    Component Value Date/Time   COLORURINE YELLOW 07/21/2024 1245   APPEARANCEUR CLEAR 07/21/2024 1245   LABSPEC 1.019 07/21/2024 1245   PHURINE 6.0 07/21/2024 1245   GLUCOSEU NEGATIVE 07/21/2024 1245   HGBUR NEGATIVE 07/21/2024 1245   BILIRUBINUR NEGATIVE 07/21/2024 1245   KETONESUR NEGATIVE 07/21/2024 1245   PROTEINUR 30 (A) 07/21/2024 1245   UROBILINOGEN 0.2 02/13/2012 1620   NITRITE NEGATIVE 07/21/2024 1245   LEUKOCYTESUR NEGATIVE 07/21/2024 1245   Sepsis Labs Recent Labs  Lab 09/04/24 0733 09/05/24 0407  PROCALCITON  --  <0.10  WBC 15.3* 14.4*    Procedures/Studies: CT Head Wo Contrast Result Date: 09/04/2024 EXAM: CT HEAD WITHOUT CONTRAST 09/04/2024 05:11:09 PM TECHNIQUE: CT of the head was performed without the administration of intravenous contrast. Automated exposure control, iterative reconstruction, and/or weight based adjustment of the mA/kV was utilized to reduce the radiation dose to as low as reasonably achievable. COMPARISON: CT head dated 12/22/2009. CLINICAL HISTORY: Mental status change, unknown cause. FINDINGS: BRAIN AND VENTRICLES: No acute hemorrhage. No evidence of acute territorial infarct. No hydrocephalus. No extra-axial collection. No mass effect or midline shift. There is overall mild scattered white matter hypodensities which are  nonspecific but most commonly represent chronic microvascular ischemic changes. Developmental venous anomaly right cerebellum. ORBITS: Bilateral lens replacement. SINUSES: No acute abnormality. SOFT TISSUES AND SKULL: No acute soft tissue abnormality. No skull fracture. IMPRESSION: 1. No acute intracranial abnormality. Electronically signed by: Prentice Spade MD 09/04/2024 05:32 PM EST RP Workstation: [id number]   DG Chest Portable 1 View Result Date: 09/04/2024 CLINICAL DATA:  Cough and shortness of breath. EXAM: PORTABLE CHEST 1 VIEW COMPARISON:  12/28/2022 FINDINGS: Right lung clear. Minimal atelectasis or infiltrate in the peripheral left base. No pulmonary edema or pleural effusion. The cardiopericardial silhouette is within normal limits for size. Telemetry leads overlie the chest. Chronic posttraumatic deformity left clavicle. IMPRESSION: Minimal atelectasis or infiltrate in the peripheral left base. Electronically Signed   By: Camellia Candle M.D.   On: 09/04/2024 07:36    Time coordinating discharge: 40 mins  SIGNED:  Norval Bar, MD [facility] 09/06/24, 11:31 AM     [1]  Allergies Allergen Reactions   No Known Allergies    "

## 2024-09-06 NOTE — Plan of Care (Signed)
" °  Problem: Clinical Measurements: Goal: Will remain free from infection Outcome: Progressing   Problem: Activity: Goal: Risk for activity intolerance will decrease Outcome: Progressing   Problem: Coping: Goal: Level of anxiety will decrease Outcome: Progressing   Problem: Pain Managment: Goal: General experience of comfort will improve and/or be controlled Outcome: Progressing   Problem: Safety: Goal: Ability to remain free from injury will improve Outcome: Progressing   Problem: Skin Integrity: Goal: Risk for impaired skin integrity will decrease Outcome: Progressing   Problem: Activity: Goal: Ability to tolerate increased activity will improve Outcome: Progressing   "

## 2024-09-06 NOTE — Care Management Important Message (Signed)
 Important Message  Patient Details  Name: [name] [name] MRN: [id number] Date of Birth: 1965-08-16   Important Message Given:  N/A - LOS <3 / Initial given by admissions     Jessilynn Taft L Conner Muegge 09/06/2024, 1:16 PM

## 2024-09-06 NOTE — Discharge Instructions (Signed)
 Take medications as prescribed Follow up with PCP within one week

## 2024-09-07 LAB — CULTURE, BLOOD (ROUTINE X 2)
Culture: NO GROWTH
Culture: NO GROWTH
Special Requests: ADEQUATE
Special Requests: ADEQUATE
